# Patient Record
Sex: Female | Born: 1935
Health system: Southern US, Community
[De-identification: ages and names within clinical notes are randomized; demographics above are authoritative.]

## PROBLEM LIST (undated history)

## (undated) DIAGNOSIS — E785 Hyperlipidemia, unspecified: Secondary | ICD-10-CM

## (undated) DIAGNOSIS — T8859XA Other complications of anesthesia, initial encounter: Secondary | ICD-10-CM

## (undated) DIAGNOSIS — Z9889 Other specified postprocedural states: Secondary | ICD-10-CM

## (undated) DIAGNOSIS — M797 Fibromyalgia: Secondary | ICD-10-CM

## (undated) DIAGNOSIS — M1990 Unspecified osteoarthritis, unspecified site: Secondary | ICD-10-CM

## (undated) DIAGNOSIS — R112 Nausea with vomiting, unspecified: Secondary | ICD-10-CM

## (undated) DIAGNOSIS — T4145XA Adverse effect of unspecified anesthetic, initial encounter: Secondary | ICD-10-CM

## (undated) DIAGNOSIS — E119 Type 2 diabetes mellitus without complications: Secondary | ICD-10-CM

## (undated) HISTORY — DX: Type 2 diabetes mellitus without complications: E11.9

## (undated) HISTORY — PX: TONSILLECTOMY: SUR1361

---

## 1989-01-12 HISTORY — PX: CERVICAL DISCECTOMY: SHX98

## 1998-11-15 ENCOUNTER — Encounter: Admission: RE | Admit: 1998-11-15 | Discharge: 1998-11-15 | Payer: Self-pay | Admitting: Orthopedic Surgery

## 1998-11-15 ENCOUNTER — Encounter: Payer: Self-pay | Admitting: Orthopedic Surgery

## 1998-12-03 ENCOUNTER — Ambulatory Visit (HOSPITAL_BASED_OUTPATIENT_CLINIC_OR_DEPARTMENT_OTHER): Admission: RE | Admit: 1998-12-03 | Discharge: 1998-12-03 | Payer: Self-pay | Admitting: Orthopedic Surgery

## 1998-12-24 ENCOUNTER — Other Ambulatory Visit: Admission: RE | Admit: 1998-12-24 | Discharge: 1998-12-24 | Payer: Self-pay | Admitting: Family Medicine

## 1999-01-08 ENCOUNTER — Encounter: Admission: RE | Admit: 1999-01-08 | Discharge: 1999-01-08 | Payer: Self-pay | Admitting: Family Medicine

## 1999-01-08 ENCOUNTER — Encounter: Payer: Self-pay | Admitting: Family Medicine

## 1999-01-13 HISTORY — PX: SHOULDER ARTHROSCOPY: SHX128

## 1999-12-24 ENCOUNTER — Other Ambulatory Visit: Admission: RE | Admit: 1999-12-24 | Discharge: 1999-12-24 | Payer: Self-pay | Admitting: Family Medicine

## 2000-07-28 ENCOUNTER — Encounter: Payer: Self-pay | Admitting: Emergency Medicine

## 2000-07-28 ENCOUNTER — Emergency Department (HOSPITAL_COMMUNITY): Admission: EM | Admit: 2000-07-28 | Discharge: 2000-07-28 | Payer: Self-pay | Admitting: Emergency Medicine

## 2001-01-07 ENCOUNTER — Other Ambulatory Visit: Admission: RE | Admit: 2001-01-07 | Discharge: 2001-01-07 | Payer: Self-pay | Admitting: Family Medicine

## 2001-12-13 ENCOUNTER — Other Ambulatory Visit: Admission: RE | Admit: 2001-12-13 | Discharge: 2001-12-13 | Payer: Self-pay | Admitting: Family Medicine

## 2002-01-12 HISTORY — PX: APPENDECTOMY: SHX54

## 2002-05-04 ENCOUNTER — Inpatient Hospital Stay (HOSPITAL_COMMUNITY): Admission: EM | Admit: 2002-05-04 | Discharge: 2002-05-11 | Payer: Self-pay | Admitting: Family Medicine

## 2002-05-05 ENCOUNTER — Encounter: Payer: Self-pay | Admitting: Family Medicine

## 2002-05-05 ENCOUNTER — Encounter (INDEPENDENT_AMBULATORY_CARE_PROVIDER_SITE_OTHER): Payer: Self-pay | Admitting: *Deleted

## 2002-05-25 ENCOUNTER — Encounter: Payer: Self-pay | Admitting: Surgery

## 2002-05-25 ENCOUNTER — Encounter: Payer: Self-pay | Admitting: General Surgery

## 2002-05-25 ENCOUNTER — Inpatient Hospital Stay (HOSPITAL_COMMUNITY): Admission: AD | Admit: 2002-05-25 | Discharge: 2002-05-30 | Payer: Self-pay | Admitting: General Surgery

## 2002-05-25 ENCOUNTER — Ambulatory Visit (HOSPITAL_COMMUNITY): Admission: RE | Admit: 2002-05-25 | Discharge: 2002-05-25 | Payer: Self-pay | Admitting: Surgery

## 2002-05-30 ENCOUNTER — Encounter: Payer: Self-pay | Admitting: General Surgery

## 2002-07-05 ENCOUNTER — Encounter: Admission: RE | Admit: 2002-07-05 | Discharge: 2002-07-28 | Payer: Self-pay | Admitting: Family Medicine

## 2002-12-22 ENCOUNTER — Other Ambulatory Visit: Admission: RE | Admit: 2002-12-22 | Discharge: 2002-12-22 | Payer: Self-pay | Admitting: Family Medicine

## 2003-01-13 HISTORY — PX: DILATION AND CURETTAGE OF UTERUS: SHX78

## 2003-03-21 ENCOUNTER — Encounter (INDEPENDENT_AMBULATORY_CARE_PROVIDER_SITE_OTHER): Payer: Self-pay | Admitting: Specialist

## 2003-03-21 ENCOUNTER — Ambulatory Visit (HOSPITAL_COMMUNITY): Admission: RE | Admit: 2003-03-21 | Discharge: 2003-03-21 | Payer: Self-pay | Admitting: Obstetrics and Gynecology

## 2004-01-13 HISTORY — PX: FRACTURE SURGERY: SHX138

## 2004-02-08 ENCOUNTER — Other Ambulatory Visit: Admission: RE | Admit: 2004-02-08 | Discharge: 2004-02-08 | Payer: Self-pay | Admitting: Family Medicine

## 2004-06-18 ENCOUNTER — Ambulatory Visit (HOSPITAL_COMMUNITY): Admission: RE | Admit: 2004-06-18 | Discharge: 2004-06-18 | Payer: Self-pay | Admitting: Orthopedic Surgery

## 2004-06-18 ENCOUNTER — Ambulatory Visit (HOSPITAL_BASED_OUTPATIENT_CLINIC_OR_DEPARTMENT_OTHER): Admission: RE | Admit: 2004-06-18 | Discharge: 2004-06-19 | Payer: Self-pay | Admitting: Orthopedic Surgery

## 2004-10-16 ENCOUNTER — Ambulatory Visit (HOSPITAL_COMMUNITY): Admission: RE | Admit: 2004-10-16 | Discharge: 2004-10-16 | Payer: Self-pay | Admitting: Endocrinology

## 2005-01-12 HISTORY — PX: SHOULDER ARTHROSCOPY: SHX128

## 2005-02-09 ENCOUNTER — Other Ambulatory Visit: Admission: RE | Admit: 2005-02-09 | Discharge: 2005-02-09 | Payer: Self-pay | Admitting: Family Medicine

## 2006-01-12 HISTORY — PX: JOINT REPLACEMENT: SHX530

## 2006-03-02 ENCOUNTER — Other Ambulatory Visit: Admission: RE | Admit: 2006-03-02 | Discharge: 2006-03-02 | Payer: Self-pay | Admitting: Family Medicine

## 2006-08-02 ENCOUNTER — Inpatient Hospital Stay (HOSPITAL_COMMUNITY): Admission: RE | Admit: 2006-08-02 | Discharge: 2006-08-05 | Payer: Self-pay | Admitting: Orthopedic Surgery

## 2007-03-08 ENCOUNTER — Other Ambulatory Visit: Admission: RE | Admit: 2007-03-08 | Discharge: 2007-03-08 | Payer: Self-pay | Admitting: Family Medicine

## 2007-08-26 ENCOUNTER — Ambulatory Visit (HOSPITAL_COMMUNITY): Admission: RE | Admit: 2007-08-26 | Discharge: 2007-08-26 | Payer: Self-pay | Admitting: Orthopedic Surgery

## 2008-10-18 ENCOUNTER — Encounter: Admission: RE | Admit: 2008-10-18 | Discharge: 2008-10-18 | Payer: Self-pay | Admitting: Family Medicine

## 2010-05-27 NOTE — Op Note (Signed)
NAME:  Meghan Alvarez, HUYSER              ACCOUNT NO.:  [id number]   MEDICAL RECORD NO.:  [id number]          PATIENT TYPE:  INP   LOCATION:  5039                         FACILITY:  [facility]   PHYSICIAN:  Robert A. Thurston Hole, M.D. DATE OF BIRTH:  24-Jun-1934   DATE OF PROCEDURE:  08/02/2006  DATE OF DISCHARGE:                               OPERATIVE REPORT   PREOPERATIVE DIAGNOSIS:  Left knee degenerative joint disease.   POSTOPERATIVE DIAGNOSIS:  Left knee degenerative joint disease.   PROCEDURE:  Left total knee replacement using DePuy cemented Total Knee  System with #2.5 cemented femur, #3 cemented tibia with 15 mm  polyethylene RP tibial spacer and 32 mm polyethylene cemented patella.   SURGEON:  Elana Alm. Thurston Hole, M.D.   ASSISTANT:  Julien Girt, P.A.   ANESTHESIA:  General.   OPERATIVE TIME:  One hour and 30 minutes.   COMPLICATIONS:  None.   DESCRIPTION OF PROCEDURE:  Ms. [name] was brought to the operating  room on August 02, 2006, after a femoral nerve block was placed in the  holding room by anesthesia.  She was placed on the operating table in  the supine position.  She received Ancef 1 g IV preoperatively for  prophylaxis.  After being placed under general anesthesia, she had a  Foley catheter placed under sterile conditions.  Her left knee was  examined.  Range of motion from 0-125 degrees with mild varus deformity  and a stable ligamentous exam with normal patellar tracking.  The left  leg was then prepped using sterile DuraPrep and draped using sterile  technique.  The leg was exsanguinated and a thigh tourniquet elevated to  350 mmHg.  Initially, through a 15 cm longitudinal incision based over  the patella, initial exposure was made.  The underlying subcutaneous  tissues were incised along with the skin incision.  A median arthrotomy  was performed, revealing an excessive amount of normal appearing joint  fluid.  Articular surfaces were inspected.  She had grade 4  changes  medially, grade 3 changes laterally and grade 3 and 4 changes in the  patellofemoral joint.  Osteophytes were removed from the femoral  condyles and tibial plateau.  The medial and lateral meniscal remnants  were removed, as well as the anterior cruciate ligament.  An  intramedullary drill was drilled up the femoral canal for placement of  the distal femoral cutting jig, which was placed in the appropriate  amount of rotation, and a distal 11 mm cut was made.  The distal femur  was then sized; 2.5 was found to be the appropriate size.  The #2.5  cutting jig was placed in an appropriate amount of external rotation,  and then these cuts were made.  The proximal tibia was then exposed.  The tibial spines were removed with an oscillating saw.  Intramedullary  drill was drilled down the tibial canal for placement of the proximal  tibial cutting jig, which was placed in the appropriate amount of  rotation, and a proximal 6 mm cut was made based off the medial or lower  side.  Spacer blocks were  then placed in flexion and extension; 15 mm  blocks gave excellent balancing, excellent stability and excellent  correction of her flexion and varus deformities.  A #3 tibial baseplate  trial was then placed on the cut tibial surface; this was an excellent  fit.  And then a keel cut was made.  The 2.5 PCL box cutter was then  placed on the distal femur and then these cuts were made.  At this  point, the 2.5 femoral trial was placed and with a #3 tibial baseplate  trial and a 15 mm polyethylene RP tibial spacer, the knee was reduced,  taken through range of motion from 0-125 degrees, with excellent  stability and excellent correction of her flexion and varus deformities  and normal patellar tracking.  A resurfacing 8 mm patella cut was then  made and three locking holes placed for a 32 mm polyethylene patellar  trial, which was placed; and again, patellofemoral tracking was  evaluated and found  to be normal.  At this point, it was felt that all  the trial components were of excellent size, fit and stability.  They  were then removed.  The knee was then jet lavage irrigated with 3 liters  of saline.  The proximal tibia was then exposed.  The #3 tibial  baseplate with cement backing was hammered into position with an  excellent fit, with excess cement being removed from around the edges.  The 2.5 femoral component with cement backing was hammered into  position, also with an excellent fit, with excess cement being removed  from around the edges.  The 15 mm polyethylene RP tibial spacer was  placed on the tibial baseplate and the knee reduced, taken through range  of motion from 0-125 degrees with excellent stability and excellent  correction of her flexion and varus deformities.  The 32 mm polyethylene  cement backed patella was then placed in its position and held there  with a clamp.  After the cement hardened, then, again, patellofemoral  tracking was evaluated and found to be normal.  At this point, it was  felt that all the components were of excellent size, fit and stability.  The knee was again irrigated with saline.  Tourniquet was released.  Hemostasis was obtained with cautery.  The arthrotomy was then closed  with #1 Ethibond suture over two medium Hemovac drains.  Subcutaneous  tissues were closed with 0 and 2-0 Vicryl, subcuticular layer closed  with 4-0 Monocryl.  Sterile dressings and long-leg splint applied.  The  patient was then awakened, extubated and taken to the recovery room in  stable condition.  Needle and sponge counts were correct x2 at the end  of the case.  Neurovascular status is normal, as well.      Robert A. Thurston Hole, M.D.  Electronically Signed     RAW/MEDQ  D:  08/02/2006  T:  08/02/2006  Job:  [id number]

## 2010-05-30 NOTE — H&P (Signed)
NAME:  Meghan Alvarez, Meghan Alvarez                        ACCOUNT NO.:  [id number]   MEDICAL RECORD NO.:  [id number]                   PATIENT TYPE:  INP   LOCATION:  5703                                 FACILITY:  [facility]   PHYSICIAN:  Gabrielle Dare. Janee Morn, M.D.             DATE OF BIRTH:  [birth year]   DATE OF ADMISSION:  05/25/2002  DATE OF DISCHARGE:                                HISTORY & PHYSICAL   CHIEF COMPLAINT:  Fevers.   HISTORY OF PRESENT ILLNESS:  The patient is a 76 year old female who  underwent laparoscopic appendectomy for perforated appendicitis on May 05, 2002.  I saw her in follow-up in my office yesterday.  She complained of  some fevers at home nightly usually into the 100.5 degree range and one time  going up as high as 101.3 degrees with some associated malaise.  She was not  having any abdominal pain or problems with her GI tract, except for some  decreased appetite.  I was concerned she may have an intra-abdominal  abscess, so I arranged a CT scan of the abdomen and pelvis early this  morning as an outpatient that revealed a pelvic abscess, so she was admitted  for IV antibiotics and percutaneous drainage of this abscess.   PAST MEDICAL HISTORY:  1. Dyslipidemia.  2. Mitral valve prolapse.  3. Diverticulosis.   PAST SURGICAL HISTORY:  1. Laparoscopic appendectomy on May 05, 2002.  2. Cervical fusion by Reinaldo Meeker, M.D.  3. Shoulder surgery.  4. Heel surgery.   SOCIAL HISTORY:  She does not use alcohol or tobacco.   FAMILY HISTORY:  Her mother passed away of CVA.  She had a daughter who  passed away of a brain tumor.  She has a brother with diabetes and  myasthenia gravis.   MEDICATIONS:  Medications at home include Bextra and Augmentin which had  been started yesterday empirically.   ALLERGIES:  CODEINE.   REVIEW OF SYSTEMS:  The patient is feeling somewhat better than yesterday,  but still has some generalized malaise.  RESPIRATORY:  No  complaints.  CARDIOVASCULAR:  No complaints.  GASTROINTESTINAL:  Please see the history  of present illness.  MUSCULOSKELETAL:  Mild arthritis, but no current  complaints.  The remainder of the review of systems is negative.   PHYSICAL EXAMINATION:  VITAL SIGNS:  Her temperature is 98.9 degrees, blood  pressure 135/65, heart rate 82, and respirations 22.  GENERAL APPEARANCE:  She is awake and alert and in no acute distress.  NECK:  Supple with no adenopathy.  LUNGS:  Clear to auscultation bilaterally.  CARDIOVASCULAR:  The heart is regular rate and rhythm.  The PMI is palpable  on the left chest.  Dorsalis pedis and radial pulses are 2+ bilaterally.  ABDOMEN:  Soft, nontender, and nondistended.  Three of her incisions are  well healed without signs of infection.  She has no masses.  EXTREMITIES:  Strength is equal bilaterally.   LABORATORY DATA:  CT scan of the abdomen and pelvis today reveals a 7 x 4 cm  abscess in her cul-de-sac anterior to her rectum.  Laboratory studies  include a white blood cell count of 12.8, hemoglobin 11.1, hematocrit 33.3,  and platelets 453.  Sodium 136, potassium 4.7, chloride 100, CO2 29, BUN 7,  creatinine 0.8, glucose 106.  PT 13.9, INR 1.0, PTT 47.   IMPRESSION:  Pelvic abscess, status post laparoscopic appendectomy for  perforated appendicitis.   PLAN:  1. Admit the patient to the hospital.  2. Give IV fluids and IV antibiotics.  Use Zosyn empirically.  3. I have arranged for a percutaneous versus transrectal drainage of this     abscess by interventional radiology.  This was discussed at length with     Art A. Hoss, M.D., who plans to proceed this afternoon.  4. We will continue to monitor the output from the drain, as well as her     fever curve.   The plan was discussed at length with the patient and her husband.  Questions were answered.                                               Gabrielle Dare Janee Morn, M.D.    BET/MEDQ  D:  05/25/2002  T:   05/26/2002  Job:  [id number]

## 2010-05-30 NOTE — Op Note (Signed)
NAME:  MAKINZEY, BANES                          ACCOUNT NO.:  [id number]   MEDICAL RECORD NO.:  [id number]                   PATIENT TYPE:  INP   LOCATION:  5504                                 FACILITY:  [facility]   PHYSICIAN:  Gabrielle Dare. Janee Morn, M.D.             DATE OF BIRTH:  1934-08-13   DATE OF PROCEDURE:  05/05/2002  DATE OF DISCHARGE:                                 OPERATIVE REPORT   PREOPERATIVE DIAGNOSIS:  Acute appendicitis.   POSTOPERATIVE DIAGNOSIS:  Ruptured appendicitis.   PROCEDURE:  Laparoscopic appendectomy.   HISTORY OF PRESENT ILLNESS:  The patient is a 76 year old female who  presented to the [facility] earlier this evening with a two-day  history of abdominal pain which localized to the right lower quadrant. She  was admitted to the [facility] hospitalist service and evaluated with a CT scan  of the abdomen and pelvis which demonstrated acute appendicitis with some  free fluid in the pelvis. I saw her in consultation and brought her to the  operating room for an emergent appendectomy.   DESCRIPTION OF PROCEDURE:  The patient received intravenous antibiotics. She  was taken to the operating room and general anesthesia was administered. Her  abdomen was prepped and draped in a sterile fashion. A curvilinear  infraumbilical incision was made, subcutaneous tissues were dissected down  revealing the external fascia which was sharply divided. The peritoneal  cavity was then entered carefully under direct vision without difficulty.  Subsequently a #0 Vicryl pursestring suture was placed around the fascial  opening and a Hasson trocar was inserted into the abdomen. The abdomen was  insufflated with carbon dioxide in a standard fashion. The camera was then  inserted and the abdomen was explored. There was some clotty purulent fluid  down in the right lower quadrant. Subsequently, a 5 mm right upper quadrant  trocar was placed under direct vision followed by a 12 mm  suprapubic trocar.  Subsequently, the cecum was visualized by moving some loops of bowel that  were only mildly adherent down to the inflammatory process. There was a good  amount of free purulent material and the necrotic appearing appendix was  exposed. The appendix was freed from some attachments along the peritoneum  down into the pelvis and subsequently retracted up superiorly towards the  anterior abdominal wall. Dissection of the mesoappendix and the base of the  cecum cleared the way down to a nice viable base at the base of the cecum. A  window was made in the mesoappendix to facilitate placement of the  endoscopic GIA.  Once this was accomplished, the endoscopic GIA stapler with  the PC load was fired across the base of the appendix. A small amount of  residual tissue remained undivided and the EndoGIA was fired again across  this finishing a nice transection of the base of the appendix and the  staples appeared in good position. Subsequently  a vascular load was placed  on the endoscopic GIA and the mesoappendix was divided. At this time, the  base of the appendix and mesoappendix was inspected. There was good  integrity of the staple lines and no bleeding. The appendix was placed in an  EndoCatch bag and removed via the suprapubic port site without difficulty.  Subsequently, the abdomen was copiously irrigated with 3 liters of saline  evacuating out all purulent material from down in the pelvis and the fluid  was evacuated from the rest of the abdomen. Once again the stapled base of  the appendix and mesoappendix were reinspected. There was good hemostasis  and integrity of the staple lines and the trocars were removed under direct  vision. The pneumoperitoneum was released. The infraumbilical fascia was  closed by tying the pursestring suture and subsequently all three wounds  were copiously irrigated and then closed with 4-0 Vicryl subcuticular  suture. A  0.25% Marcaine was  used for local anesthetic at all incision sites. Sponge,  needle and instrument counts were correct. Benzoin and Steri-Strips and  sterile dressings were applied. The patient tolerated the procedure well  without apparent complications and was taken to the recovery room in stable  condition.                                               Gabrielle Dare Janee Morn, M.D.    BET/MEDQ  D:  05/05/2002  T:  05/06/2002  Job:  [id number]

## 2010-05-30 NOTE — Consult Note (Signed)
NAME:  Meghan Alvarez, Meghan Alvarez                          ACCOUNT NO.:  [id number]   MEDICAL RECORD NO.:  [id number]                   PATIENT TYPE:  INP   LOCATION:  5504                                 FACILITY:  [facility]   PHYSICIAN:  Gabrielle Dare. Janee Morn, M.D.             DATE OF BIRTH:  10-26-1934   DATE OF CONSULTATION:  05/05/2002  DATE OF DISCHARGE:                                   CONSULTATION   REASON FOR CONSULTATION:  Right lower quadrant abdominal pain.   HISTORY OF PRESENT ILLNESS:  The patient is a 76 year old female whom I was  asked to evaluate by Dr. Leander Rams from [facility].  She has history of  approximately 48 hours of abdominal pain across her lower abdomen and  subsequently persisted and localized to right lower quadrant.  She developed  nausea and vomiting over the last 12 hours.  She took some Gas-X medication  at home but did not have significant relief.  She has not had a bowel  movement since the pain began. The pain continued to worsen, and she had  some subjective fevers at home but did not take her temperature.  She  continued to have discomfort,and she was evaluated at the [facility] walk-in  clinic earlier today.  After evaluation there, she was admitted to the floor  where she underwent CT scan of the abdomen and pelvis which demonstrated  acute appendicitis.  The patient has complain of the same right lower  quadrant pain at this time.   PAST MEDICAL HISTORY:  1. Dyslipidemia.  2. Mitral valve prolapse.  3. Diverticulosis.  4. Obesity including recent 33-pound weight loss which was intentional.   PAST SURGICAL HISTORY:  1. Cervical fusion by Dr. Gerlene Fee.  2. Shoulder surgery.  3. Heel surgery.  4. Laparoscopic _______.   SOCIAL HISTORY:  She does not drink alcohol or use tobacco.   FAMILY HISTORY:  Her mother passed away of a stroke.  She had a daughter who  died of a brain tumor.  She had a brother with myoasthenia gravis and  diabetes.   MEDICATIONS  AT HOME:  Bextra.   ALLERGIES:  CODEINE.   REVIEW OF SYSTEMS:  GENERAL:  She is feeling somewhat weak. CARDIOVASCULAR:  No complaints.  RESPIRATORY:  No complaints.  GI: See History of Present  Illness.  MUSCULOSKELETAL:  She has mild arthritis, although this is not  bothering her all that much.  HEENT:  She complains of some ear discomfort  and problems for which she is being treated.  The remainder of the Review of  Systems is negative.   PHYSICAL EXAMINATION:  VITAL SIGNS:  Temperature 98.2, blood pressure  147/77, pulse 85, respirations 16, O2 saturation 97% on room air.  GENERAL:  She is awake and alert and in mild distress.  HEENT:  Pupils equal and reactive.  NECK:  Supple with no adenopathy or masses.  LUNGS:  Clear to auscultation bilaterally.  CARDIOVASCULAR:  Heart is regular rate and rhythm, and distal pulses are 2+.  ABDOMEN:  Soft and nondistended.  There are no masses palpable, but she is  tender in the right lower quadrant with voluntary guarding.  She also has a  positive Rovighi sign.  SKIN:  Warm and dry with no rash.   LABORATORY DATA:  White blood cell count 18.6, hemoglobin 14.5, platelets  233.  Sodium 134, potassium 4, chloride 102, CO2 24, BUN 11, creatinine 0.9,  glucose 147.  AST 22, ALT 27, alkaline phosphatase 86, bilirubin 1.4.  Lipase and amylase normal.   CT scan of abdomen and pelvis reviewed with radiologist and demonstrates  acute appendicitis with a small amount of free fluid down in the pelvis.   IMPRESSION:  A 76 year old female with acute appendicitis, possibly  ruptured.   PLAN:  Take patient to the operating room for appendectomy.  The procedure,  risks, and benefits were discussed with the patient, and she is eager to  proceed.                                               Gabrielle Dare Janee Morn, M.D.    BET/MEDQ  D:  05/05/2002  T:  05/06/2002  Job:  [id number]

## 2010-05-30 NOTE — Discharge Summary (Signed)
   NAME:  Meghan Alvarez, Meghan Alvarez                        ACCOUNT NO.:  [id number]   MEDICAL RECORD NO.:  [id number]                   PATIENT TYPE:  INP   LOCATION:  5504                                 FACILITY:  [facility]   PHYSICIAN:  Gabrielle Dare. Janee Morn, M.D.             DATE OF BIRTH:  1934/01/29   DATE OF ADMISSION:  05/04/2002  DATE OF DISCHARGE:  05/11/2002                                 DISCHARGE SUMMARY   DISCHARGE DIAGNOSIS:  Status post laparoscopic appendectomy for perforated  appendicitis.   HISTORY OF PRESENT ILLNESS:  The patient is a 76 year old female who was  admitted to the [facility] by Dr. Leander Rams with a 48-hour history  of abdominal pain.  CT scan, at that time, demonstrated acute appendicitis  with possible perforation.  She was taken emergently to the operating room.   HOSPITAL COURSE:  The patient underwent an uncomplicated laparoscopic  appendectomy.  She did indeed have perforated appendicitis.  Postoperatively, she tolerated gradual advancement of her diet only  requiring some laxatives and enemas to move her bowels.  She did not develop  a prolonged ileus.  She was maintained on intravenous antibiotics until her  white count and fever curve normalized which did take six days, but now she  has remained hemodynamically stable.  She has been afebrile for over 24  hours and her white blood cell count yesterday was 8.5 and she was  discharged home today in stable condition.   DISCHARGE DIET:  Regular.   DISCHARGE ACTIVITY:  No lifting for five more weeks.   DISCHARGE MEDICATIONS:  1. Percocet 5/325 one p.o. q.6h. p.r.n. pain.  2. She is also to resume her home medications which include Bextra for her     arthritis.   FOLLOW UP:  The patient is going to follow up with myself in my office in  three weeks and she is also going to follow up with her primary care doctor  in a month or so.                                               Gabrielle Dare Janee Morn,  M.D.    BET/MEDQ  D:  05/11/2002  T:  05/11/2002  Job:  [id number]

## 2010-05-30 NOTE — Op Note (Signed)
NAME:  Meghan Alvarez, Meghan Alvarez                        ACCOUNT NO.:  [id number]   MEDICAL RECORD NO.:  [id number]                   PATIENT TYPE:  AMB   LOCATION:  SDC                                  FACILITY:  [facility]   PHYSICIAN:  Artist Pais, M.D.                 DATE OF BIRTH:  1935-01-10   DATE OF PROCEDURE:  03/21/2003  DATE OF DISCHARGE:                                 OPERATIVE REPORT   PREOPERATIVE DIAGNOSES:  1. Postmenopausal bleeding.  2. Polyp on sonohysterogram.   POSTOPERATIVE DIAGNOSES:  1. Postmenopausal bleeding.  2. Polyp on sonohysterogram.  3. Patient found to have a small submucosal myoma.   PROCEDURES:  Dilatation and curettage, hysteroscopy, and polypectomy.   ANESTHESIA:  Monitored anesthesia care plus 20 mL 1% lidocaine paracervical  block.   The patient also received 2 g of IV ampicillin for SBE prophylaxis.   DRAINS:  None.  Foley:  None.   FLUIDS REPLACED:  700 mL of crystalloid.   ESTIMATED BLOOD LOSS:  Minimal.   FINDINGS:  Right endometrial polyp on a stalk adjacent to the right tubal  ostium.  There was a small fibroid adjacent to the left tubal ostium, which  was submucosal in location.   SURGEON:  Artist Pais, M.D.   COMPLICATIONS:  None.   DESCRIPTION OF OPERATION:  The patient was brought to the operating room,  identified on the operating room table.  After the patient was adequately  sedated, she was placed in a dorsal lithotomy position and prepped and  draped in the usual sterile fashion.  The bladder was straight-catheterized  for a small amount of clear yellow urine.  A bimanual examination revealed  the uterus to be midposition, normal and mobile.  A speculum was placed and  the anterior lip of the cervix was infiltrated with 1 mL of 1% lidocaine.  The remaining 19 mL of lidocaine were placed for a paracervical block.  It  should be noted that in the preop area the patient did receive 2 g of IV  ampicillin for SBE prophylaxis.   The uterus sounded to 7 cm.  The cervix was  very carefully and gently dilated up to a #25 Pratt dilator.  Dilatation  proceeded very gently and carefully to decrease the risk of uterine  perforation.  Subsequently the [facility]I hysteroscope using sorbitol as a  distending medium was placed.  A careful and thorough hysteroscopic  examination was performed.  The endometrium did appear thin, but I did note  the polyp seen on sonohysterogram emanating from adjacent to the right tubal  ostium.  It was free-floating on a stalk.  There was also a small submucosal  fibroid superior to the left tubal ostium.  At that point the hysteroscope  was withdrawn and a curettage was performed.  The Randall stone forceps were  placed, and I was able to grasp the polyp and remove it in  its entirety.  I  did curette over the fibroid, but it was noted to be a small fibroid and not  a polyp.  After a good cry was heard all around, the Randall stone forceps  were again placed and additional tissue was obtained.  Subsequently the [facility]  hysteroscope was placed and the polyp was noted to have been removed in its  entirety and, in fact, the endometrium was noted to be entirely well-  sampled.  At that point the procedure was then terminated.  The hysteroscope  was withdrawn, the tenaculum was withdrawn, and there was noted to be no  bleeding from the tenaculum site.  The patient was subsequently transferred  to the recovery room in stable condition after all instrument, sponge, and  needle counts were correct.  In addition, the patient did receive postop  instructions.  She is urged to refrain from intercourse for two to three  weeks until I see her back for a postoperative evaluation.  She is to call  for heavy bleeding such as soaking a large pad an hour for three straight  hours or any problems.  She may take ibuprofen 400-600 mg every six hours as  needed for pain.  Of note, she did receive Toradol 30 mg IV at the end  of  the procedure.                                               Artist Pais, M.D.    DC/MEDQ  D:  03/21/2003  T:  03/22/2003  Job:  [id number]   cc:   Chales Salmon. Abigail Miyamoto, M.D.  [address]  [place]  [place] [address]  Fax: [phone]

## 2010-05-30 NOTE — Discharge Summary (Signed)
   NAME:  NJERI, Meghan Alvarez                        ACCOUNT NO.:  [id number]   MEDICAL RECORD NO.:  [id number]                   PATIENT TYPE:  INP   LOCATION:  5703                                 FACILITY:  [facility]   PHYSICIAN:  Gabrielle Dare. Janee Morn, M.D.             DATE OF BIRTH:  1934-02-27   DATE OF ADMISSION:  05/25/2002  DATE OF DISCHARGE:  05/30/2002                                 DISCHARGE SUMMARY   DISCHARGE DIAGNOSES:  1. Status post drainage of pelvic abscess,  2. Status post laparoscopic appendectomy for perforated appendicitis.   HISTORY OF PRESENT ILLNESS:  The patient is a 76 year old white female who  underwent a laparoscopic appendectomy for perforated appendicitis on  05/05/02.  She developed fevers at home.  I evaluated her in my office on  05/24/02 and ordered a CT scan of the abdomen and pelvis to evaluate for  abscess.  She underwent that on the morning of 05/25/02, and this showed a  pelvic abscess, and she was admitted to the hospital.   HOSPITAL COURSE:  The patient was admitted, placed on intravenous  antibiotics (Zosyn), and given some IV antibiotics.  She underwent drainage  of her pelvic abscess transrectally by interventional radiology.  Fluid was  returned, which grew E. coli.  She was continued on her Zosyn.  The drainage  gradually tapered, and a follow up CT revealed resolution of the abscess,  and the drain was removed.  She was switched to oral antibiotics, which she  tolerated.  She remained afebrile and was discharged home in stable  condition.   DISCHARGE DIET:  Regular.   DISCHARGE ACTIVITY:  As tolerated.   DISCHARGE MEDICATIONS:  1. Augmentin 875 mg p.o. b.i.d.  2. Tylox 5/325 one p.o. q.6h. p.r.Meghan. pain.  3. Follow up with myself in two weeks.                                               Gabrielle Dare Janee Morn, M.D.    BET/MEDQ  D:  06/19/2002  T:  06/19/2002  Job:  [id number]

## 2010-05-30 NOTE — Discharge Summary (Signed)
NAME:  Meghan Alvarez, Meghan Alvarez              ACCOUNT NO.:  [id number]   MEDICAL RECORD NO.:  [id number]          PATIENT TYPE:  INP   LOCATION:  5039                         FACILITY:  [facility]   PHYSICIAN:  Robert A. Thurston Hole, M.D. DATE OF BIRTH:  [birth year]   DATE OF ADMISSION:  08/02/2006  DATE OF DISCHARGE:  08/05/2006                               DISCHARGE SUMMARY   ADMITTING DIAGNOSIS:  End-stage degenerative joint disease, left knee.   DISCHARGE DIAGNOSES:  1. End-stage degenerative joint disease, left knee.  2. Obesity.   HISTORY OF PRESENT ILLNESS:  The patient is a 76 year old white female  with a history of end-stage DJD of both knees.  She failed conservative  care including interarticular cortisone injections and interarticular  Supartz injections.  Risks, benefits and possible complications of a  left total knee replacement have been discussed with her.   ALLERGIES:  CODEINE.   PROCEDURES IN HOUSE:  On August 02, 2006, the patient underwent a left  total knee replacement by Dr. Thurston Hole and a femoral nerve block by  anesthesia.  She tolerated the procedure well.  Postoperatively, she  received an Autovac transfusion to prevent postoperative blood loss  anemia.  She tolerated this well.  She tolerated her CPM 0 to 90 degrees  8 hours a day.  Postoperative day #1, temperature was 98.6, hemoglobin  was 1.1.  INR was 1.1.  Surgical wound was well approximated.  Her Foley  was discontinued.  Her PCA was discontinued.  She was given a 30-minute  passive stretch.  She was placed on Dilaudid p.o. for pain.  Physical  therapy got her up.  Postoperative day #1, she ambulated approximately  50 feet, was able to do her quad pumps.  Postoperative day #2, T-max 99,  hemoglobin 10.8.  She was metabolically stable.  Actually temperature on  exam was 99; T-max was 101.  INR was 1.4.  She tolerated her CPM 0 to 90  degrees.  Surgical wound was well-approximated.  She was given a  Dulcolax  suppository.  She progressed slowly with physical therapy on  postoperative day #2, only being able to walk 16 feet.  Due to her fever  and her slow progression with physical therapy, she was kept one more  day.  Postoperative day #3, T-max 100.6, hemoglobin 11.5.  INR was 2.1.  Surgical wound was well-approximated.  She tolerated CPM 0 to 90  degrees.  She was discharged to home on postoperative day #3,  weightbearing as tolerated on a regular diet.   DISCHARGE MEDICATIONS:  1. Dilaudid 2 mg 1 to 2 q.4-6h. p.r.n. pain.  2. Robaxin 500 mg 1 q.4-6h. p.r.n. muscle spasm.  3. Coumadin 3 mg 1 tablet daily.  4. Keflex 500 mg 1 p.o. q.i.d. for 10 days.  5. Colace 100 mg twice a day.  6. Senokot-S 2 tablets at night.   She has been instructed to change her dressing daily.  Call us with  increased redness, increased swelling, increased temperature, increased  pain.  She has been instructed to elevate her left heel on a folded  pillow 30 minutes  a day and use her CPM 0 to 90 degrees 8 hours a day.  She will follow up with Dr. Thurston Hole on August 4.  She has been cleared to  shower on August 10, 2006.      Kirstin Shepperson, P.A.      Robert A. Thurston Hole, M.D.  Electronically Signed    KS/MEDQ  D:  10/06/2006  T:  10/07/2006  Job:  [id number]

## 2010-05-30 NOTE — Op Note (Signed)
NAMEBRYONA, FOXWORTHY              ACCOUNT NO.:  [id number]   MEDICAL RECORD NO.:  [id number]          PATIENT TYPE:  AMB   LOCATION:  DSC                          FACILITY:  [facility]   PHYSICIAN:  Cindee Salt, M.D.       DATE OF BIRTH:  Jun 10, 1934   DATE OF PROCEDURE:  06/18/2004  DATE OF DISCHARGE:                                 OPERATIVE REPORT   PREOPERATIVE DIAGNOSIS:  Fractured olecranon and radial head, left elbow.   POSTOPERATIVE DIAGNOSIS:  Fractured olecranon and radial head, left elbow.   OPERATION:  1.  Excision of radial head fragment.  2.  Open reduction, internal fixation, comminuted olecranon fracture.   SURGEON:  Cindee Salt, M.D.   ASSISTANT:  Artist Pais. Mina Marble, M.D.   ANESTHESIA:  General.   HISTORY:  The patient is a 76 year old female who fell in [facility],  suffering a fracture of her olecranon, comminuted in nature, with a  questionable fracture of the radial head.  She is admitted for open  reduction and internal fixation.   PROCEDURE:  The patient was brought to the operating room, where a general  anesthetic was carried out without difficulty.  She was prepped using  DuraPrep, supine position, left arm free.  The arm was brought across her  chest and the limb exsanguinated with an Esmarch bandage.  Tourniquet placed  on the upper arm was inflated to 250 mmHg.  A straight incision was made  over the elbow, carried down through subcutaneous tissue.  Bleeders were  electrocauterized.  Dissection carried down to the fracture.  A wide  separation of the olecranon fracture with fragment was immediately apparent.  This was debrided and significant crepitation was present within the joint.  The radial head was then visualized through the joint and a fracture  fragment on the radial head articular surface which was entirely loose was  removed.  This left approximately 80% of the radial head intact.  A separate  fracture was present on the capitellum.  This  was of posterior.  This was  removed.  The joint was otherwise stable.  The fracture fragment of the  olecranon was then reduced after irrigation of the joint.  The bone was held  in position.  An Accumed olecranon fixation plate was then placed.  This was  held in position and a distal screw holding it in position was then  inserted.  This measured 20 mm.  The OVC image intensification was used to  localize the plate.  Two screws were then placed into the fracture of the  coronoid.  These measured 27 and 25 mm.  These were angled so as to capture  the fragment; however, with pronation and supination, some crepitation was  felt to the radioulnar joint.  It was felt that the screws may the slightly  malaligned.  The oblique x-rays confirmed that threads were present at the  ulnar-radial head articulation.  These screws were removed one by one and  replaced with 20 and 25 mm screws.  This firmly fixed the capitellum in position.  A longitudinal screw was  then  placed just to the ulnar side of the other two screw holes into the fracture  of the coronoid, firmly fixing it into position.  This was lagged, as were the other two in the capitellum to firmly stabilize  the fracture.  The articular surface was well-aligned, the fracture  fragments reduced.  The remaining screws were placed.  These measured 16 and  14 mm, firmly fixing the fracture in position.  Full flexion-extension,  pronation, supination was then afforded.  There was no instability and no  crepitation with rotation.  The wound was copiously irrigated.  The  subcutaneous tissue was then closed with 4-0 Vicryl suture and the skin  stapled.  A sterile compressive dressing and a long-arm splint applied.  The  patient tolerated the procedure well and was taken to the recovery room for  observation in satisfactory condition.  On deflation of the tourniquet,  fingers immediately pinked.      GK/MEDQ  D:  06/18/2004  T:  06/18/2004   Job:  [id number]

## 2010-05-30 NOTE — H&P (Signed)
NAME:  Meghan Alvarez, THUMM                        ACCOUNT NO.:  [id number]   MEDICAL RECORD NO.:  [id number]                   PATIENT TYPE:  AMB   LOCATION:  SDC                                  FACILITY:  [facility]   PHYSICIAN:  Artist Pais, M.D.                 DATE OF BIRTH:  04/19/1934   DATE OF ADMISSION:  DATE OF DISCHARGE:                                HISTORY & PHYSICAL   DATE OF ADMISSION:  March 21, 2003   HISTORY OF PRESENT ILLNESS:  The patient is a 76 year old Caucasian female  gravida 5 para 4 referred from Dr. Abigail Miyamoto at [place] [facility].  She was found to have a thickened endometrium on ultrasound.  This occurred  after she had a CT of her abdomen and pelvis to explore some right flank  pain.  Her plain films of her back and abdominal CT were normal; however,  the endometrial stripe was noted to be slightly thickened and Dr. Abigail Miyamoto  obtained a pelvic ultrasound showing an endometrium at 10.5 mm with  increased echogenicity which is abnormal for a postmenopausal patient.  She  was referred for further workup.  When I asked her had she had any  postmenopausal bleeding she said that she had had postmenopausal bleeding  for years.  She subsequently underwent a sonohysterogram which revealed  there to be a 7 x 8 mm polyp in the endometrial cavity.  The patient did  tolerate the procedure well.  She was advised to undergo a D&C hysteroscopy  and polypectomy.  Risks of surgery including anesthetic complication,  hemorrhage, infection, damage to adjacent structures including bladder,  bowel, blood vessels, or ureter discussed with the patient.  She was made  aware of the risk of uterine perforation which could result in overwhelming  life-threatening hemorrhage requiring emergent hysterectomy, and uterine  perforation which could result in bowel damage requiring emergent colostomy  or which could result in overwhelming, life-threatening peritonitis.  She  expressed  understanding of and acceptance of these risks and desires to  proceed with surgery.  She knows that due to her history of postmenopausal  bleeding and endometrial polyp we need to rule out endometrial hyperplasia  or endometrial cancer.   OBSTETRICAL AND GYNECOLOGICAL HISTORY:  Menarche age 63.  The patient is  menopausal.  She has no history of an STD or PID.  She does not have a  history of an abnormal Pap.   PAST MEDICAL HISTORY:  1. Arthritis.  2. Mitral valve prolapse.  3. Osteoarthritis.  4. Obesity.  5. Hyperlipidemia.  6. Diverticulosis.  7. History of rectal bleeding.   ALLERGIES:  CODEINE causes nausea and vomiting.   CURRENT MEDICATIONS:  1. Diclofenac 75 mg b.i.d.  2. Augmentin previously.  3. She previously was on Prempro but discontinued this several years ago.   SURGERIES:  History of a ruptured appendix with a subsequent abscess in  2004, bone fusion in 1991, knee arthroscopy, shoulder arthroscopy, and  removal of a heel spur in 2003.  She also has a history of a nasal fracture  as well.   FAMILY HISTORY:  The patient's mother died at 58 of pneumonia and a CVA.  Her father died at age 53 of a brain tumor.  She has one brother age 7 with  myasthenia gravis and diabetes and a sister age 32 alive and well.   SOCIAL HISTORY:  The patient is retired.  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  Tobacco:  None.  Alcohol:  None.   REVIEW OF SYSTEMS:  Noncontributory except as noted above.  Denies headache,  visual changes, chest pain, shortness of breath, abdominal pain, change in  bowel habits, unintentional weight loss, dysuria, urgency, frequency,  vaginal pruritus or discharge, pain or bleeding with intercourse.   PHYSICAL EXAMINATION:  GENERAL APPEARANCE:  A well-developed Caucasian  female.  VITAL SIGNS:  Blood pressure 130/84, weight 192, heart rate 72.  HEENT:  Normal.  NECK:  Supple without thyromegaly, adenopathy, or nodules.  CHEST:   Clear to auscultation.  BREASTS:  Symmetric without masses, nodes, nipple retraction, or nipple  discharge.  CARDIAC:  Regular rate and rhythm without extra sounds or murmurs.  ABDOMEN:  Soft, nontender, obese.  No hepatosplenomegaly or masses.  EXTREMITIES:  No cyanosis, clubbing, or edema.  NEUROLOGIC:  Oriented x3, grossly normal.  PELVIC:  Normal external female genitalia.  No vulvar, vaginal, or cervical  lesions.  Pap smear was performed by Dr. Abigail Miyamoto recently and noted to be  within normal limits.  Bimanual examination reveals the uterus to be normal,  mobile, nontender, without any adnexal mass palpated.  RECTAL:  Excellent sphincter tone, confirms pelvic exam.  No masses  palpated.   Of note, the patient's Pap smear did not show any evidence of any  endometrial cells.   ASSESSMENT AND PLAN:  The patient is a 76 year old Caucasian female with  postmenopausal bleeding, a thickened endometrium, and endometrial polyp  admitted for [facility] hysteroscopy and polypectomy.  I did receive a letter from  Dr. Abigail Miyamoto clearing her for surgery and we will order endocarditis  prophylaxis for her.  Interestingly, when I asked her if she took  antibiotics prior to dental procedures she said no, that she did not know  she was supposed to, but Dr. Recardo Evangelist notes clearly indicate that she does  need SBE prophylaxis.  All of her questions were answered.  The patient  desires to proceed with surgery.                                               Artist Pais, M.D.    DC/MEDQ  D:  03/21/2003  T:  03/21/2003  Job:  [id number]

## 2010-10-27 LAB — PROTIME-INR
INR: 1
INR: 1.1
INR: 1.4
INR: 2.1 — ABNORMAL HIGH
Prothrombin Time: 13.3
Prothrombin Time: 14.1
Prothrombin Time: 24.8 — ABNORMAL HIGH

## 2010-10-27 LAB — COMPREHENSIVE METABOLIC PANEL
ALT: 65 — ABNORMAL HIGH
BUN: 8
CO2: 27
Calcium: 10.2
GFR calc non Af Amer: >60
Glucose, Bld: 120 — ABNORMAL HIGH
Sodium: 138
Total Protein: 7.3

## 2010-10-27 LAB — ABO/RH: ABO/RH(D): A POS

## 2010-10-27 LAB — CBC
HCT: 40
Hemoglobin: 10.8 — ABNORMAL LOW
Hemoglobin: 13.9
MCHC: 34.7
MCHC: 34.7
MCV: 93.7
MCV: 94
Platelets: 195
Platelets: 202
RBC: 4.26
RDW: 13.5
RDW: 13.9
WBC: 9
WBC: 9.8

## 2010-10-27 LAB — URINALYSIS, ROUTINE W REFLEX MICROSCOPIC
Bilirubin Urine: NEGATIVE
Hgb urine dipstick: NEGATIVE
Ketones, ur: 15 — AB
Leukocytes, UA: NEGATIVE
Nitrite: NEGATIVE
Nitrite: NEGATIVE
Protein, ur: NEGATIVE
Protein, ur: NEGATIVE
Urobilinogen, UA: 0.2
pH: 7

## 2010-10-27 LAB — BASIC METABOLIC PANEL
BUN: 8
BUN: 9
CO2: 28
Calcium: 8.7
Calcium: 9.1
Chloride: 100
Creatinine, Ser: 0.87
Creatinine, Ser: 0.99
GFR calc Af Amer: >60
GFR calc Af Amer: >60
GFR calc non Af Amer: 55 — ABNORMAL LOW
GFR calc non Af Amer: 60 — ABNORMAL LOW
Glucose, Bld: 147 — ABNORMAL HIGH
Sodium: 137

## 2010-10-27 LAB — DIFFERENTIAL
Basophils Relative: 1
Basophils Relative: 1
Eosinophils Absolute: 0.1
Eosinophils Absolute: 0.1
Lymphs Abs: 2.6
Monocytes Relative: 7
Neutro Abs: 2.7
Neutrophils Relative %: 46
Neutrophils Relative %: 61

## 2010-10-27 LAB — URINE MICROSCOPIC-ADD ON

## 2010-10-27 LAB — TYPE AND SCREEN: ABO/RH(D): A POS

## 2010-10-27 LAB — APTT: aPTT: 30

## 2010-10-27 LAB — URINE CULTURE

## 2010-12-30 ENCOUNTER — Ambulatory Visit: Payer: Medicare Other | Attending: Rheumatology | Admitting: Physical Therapy

## 2010-12-30 DIAGNOSIS — R5381 Other malaise: Secondary | ICD-10-CM | POA: Insufficient documentation

## 2010-12-30 DIAGNOSIS — IMO0001 Reserved for inherently not codable concepts without codable children: Secondary | ICD-10-CM | POA: Insufficient documentation

## 2010-12-30 DIAGNOSIS — M6281 Muscle weakness (generalized): Secondary | ICD-10-CM | POA: Insufficient documentation

## 2010-12-30 DIAGNOSIS — M2550 Pain in unspecified joint: Secondary | ICD-10-CM | POA: Insufficient documentation

## 2011-01-16 ENCOUNTER — Ambulatory Visit: Payer: Medicare Other | Attending: Rheumatology | Admitting: Physical Therapy

## 2011-01-16 DIAGNOSIS — IMO0001 Reserved for inherently not codable concepts without codable children: Secondary | ICD-10-CM | POA: Insufficient documentation

## 2011-01-16 DIAGNOSIS — M2550 Pain in unspecified joint: Secondary | ICD-10-CM | POA: Insufficient documentation

## 2011-01-16 DIAGNOSIS — R5381 Other malaise: Secondary | ICD-10-CM | POA: Insufficient documentation

## 2011-01-16 DIAGNOSIS — M6281 Muscle weakness (generalized): Secondary | ICD-10-CM | POA: Insufficient documentation

## 2011-01-20 ENCOUNTER — Ambulatory Visit: Payer: Medicare Other | Admitting: Physical Therapy

## 2011-01-26 ENCOUNTER — Ambulatory Visit: Payer: Medicare Other | Admitting: Physical Therapy

## 2011-01-29 ENCOUNTER — Encounter: Payer: Medicare Other | Admitting: Physical Therapy

## 2011-02-02 ENCOUNTER — Ambulatory Visit: Payer: Medicare Other | Admitting: Physical Therapy

## 2011-02-09 ENCOUNTER — Encounter: Payer: Medicare Other | Admitting: Physical Therapy

## 2011-05-04 IMAGING — CT CT HEAD WO/W CM
3 series · 18 of 30 positions shown, 20 images · IV contrast (agent unspecified)
Comparison: None

CLINICAL DATA: Persistent headaches.

CT HEAD WITHOUT AND WITH CONTRAST
TECHNIQUE: Contiguous axial images were obtained from the base of
the skull through the vertex without and with intravenous contrast
Contrast: 75 ml Kmnipaque-[facility] IV.

[Series 2: head wo · axial · 0.49mm/px · z∈[-365,-265]mm · 8 of 26 slices shown, 10 images]
[im 3/26  brain]
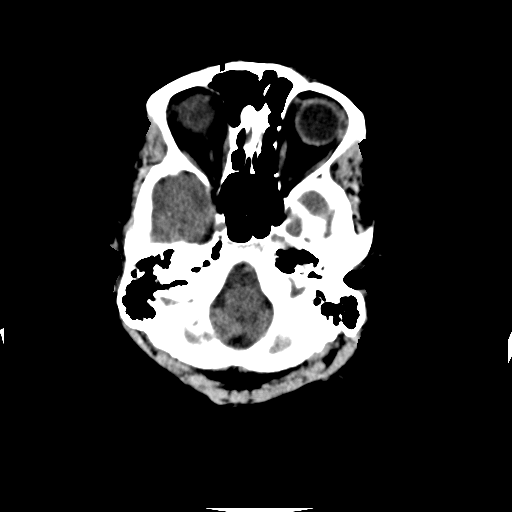
[im 3/26  bone]
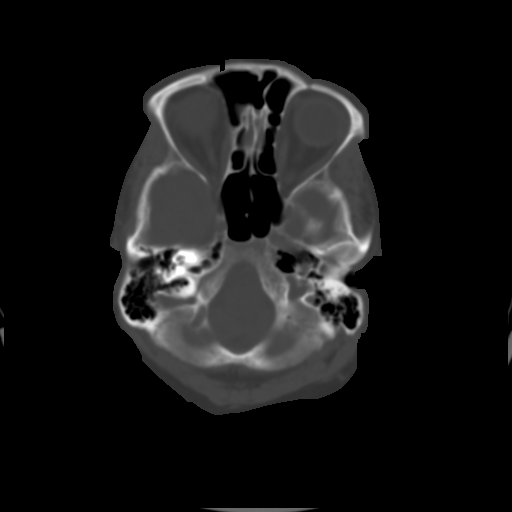
[im 6/26  brain]
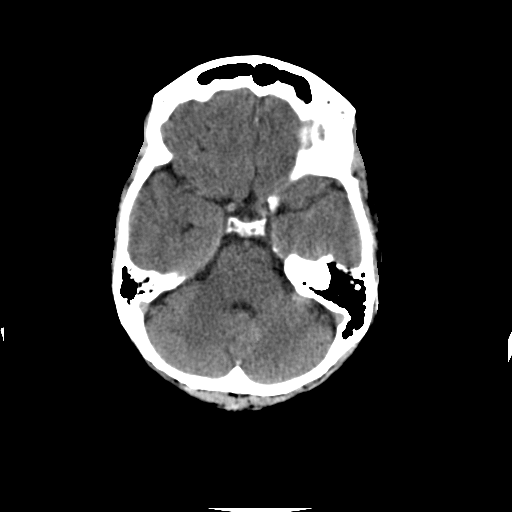
[im 9/26  brain]
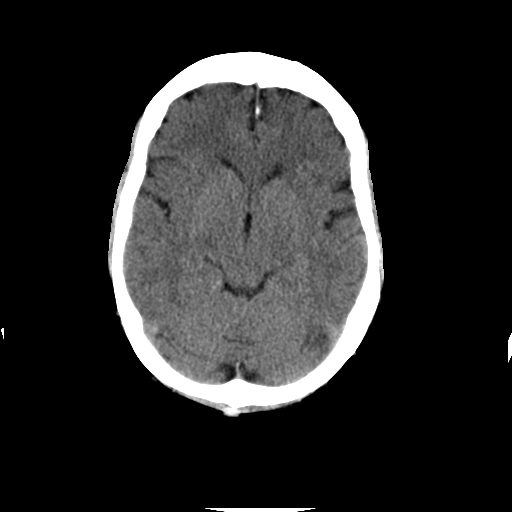
[im 12/26  brain]
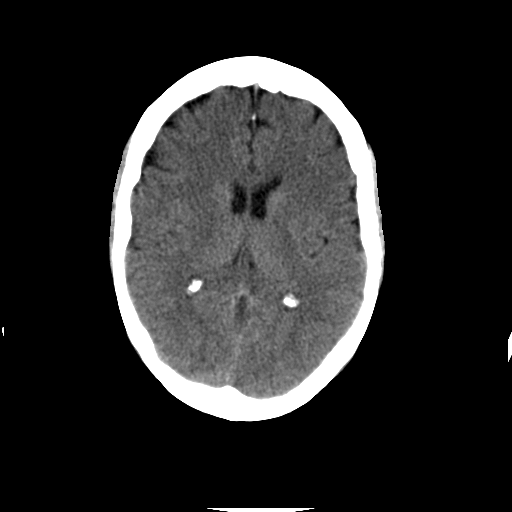
[im 14/26  brain]
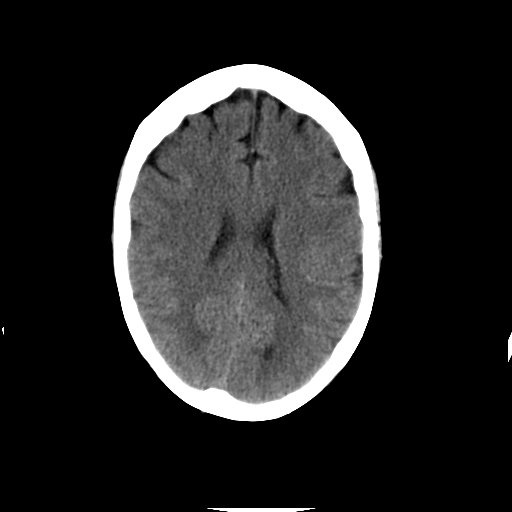
[im 14/26  bone]
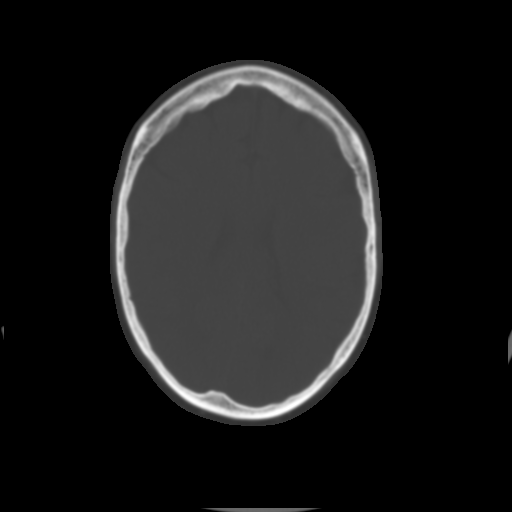
[im 17/26  brain]
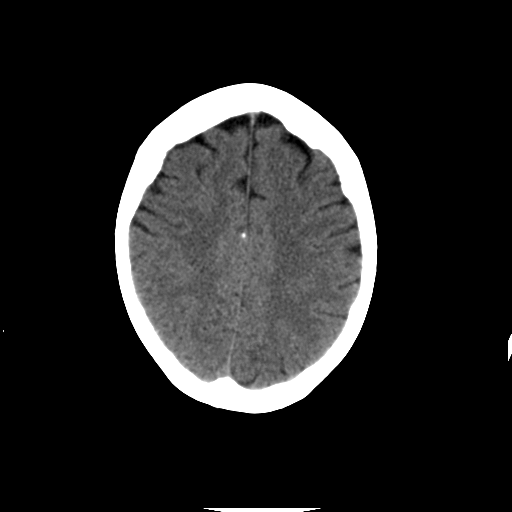
[im 20/26  brain]
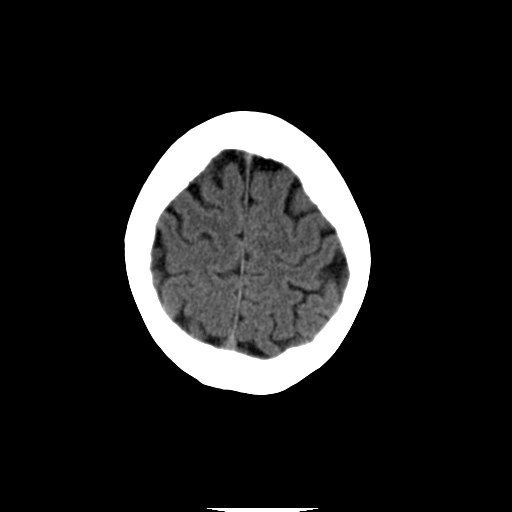
[im 23/26  brain]
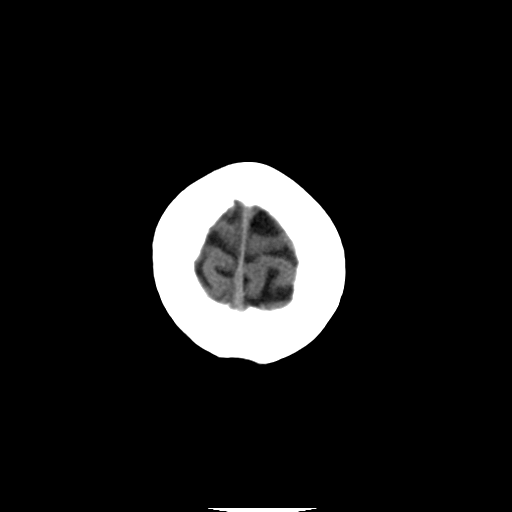

[Series 3: head w/ · axial · 0.49mm/px · z∈[-365,-265]mm · 8 of 26 slices shown]
[im 3/26  brain]
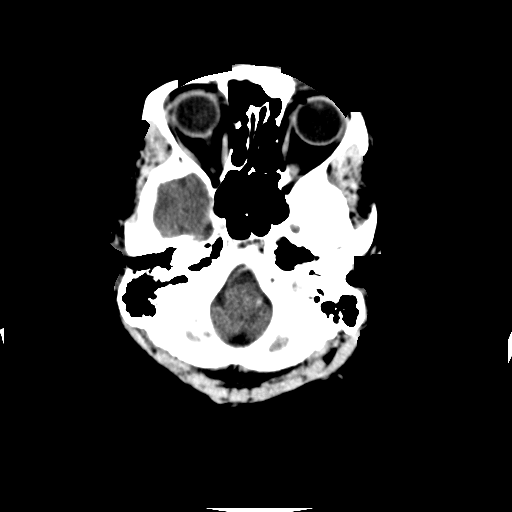
[im 6/26  brain]
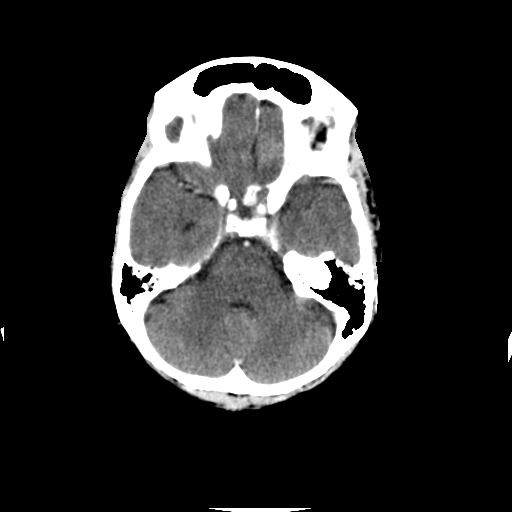
[im 9/26  brain]
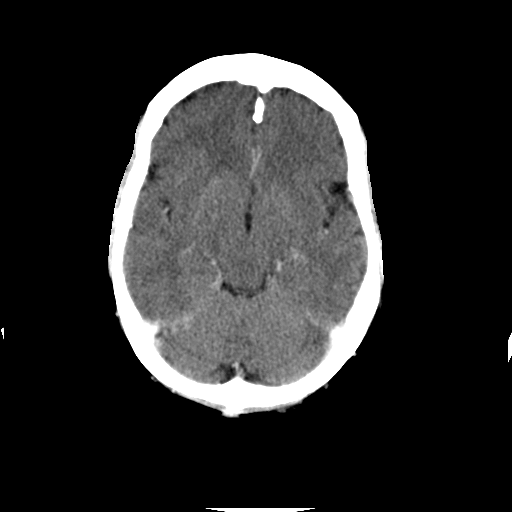
[im 12/26  brain]
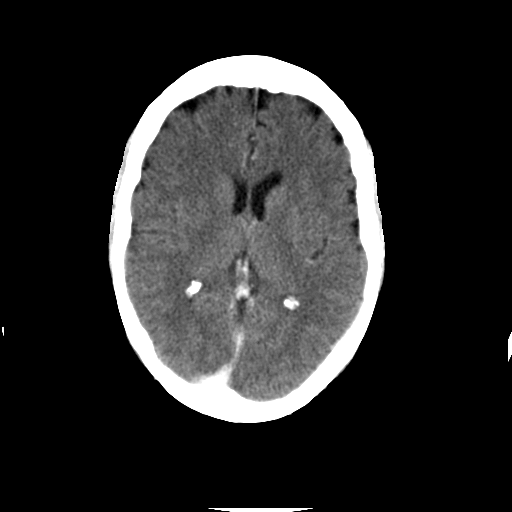
[im 14/26  brain]
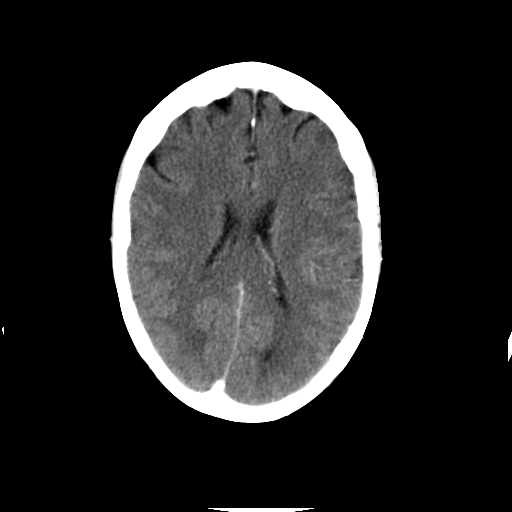
[im 17/26  brain]
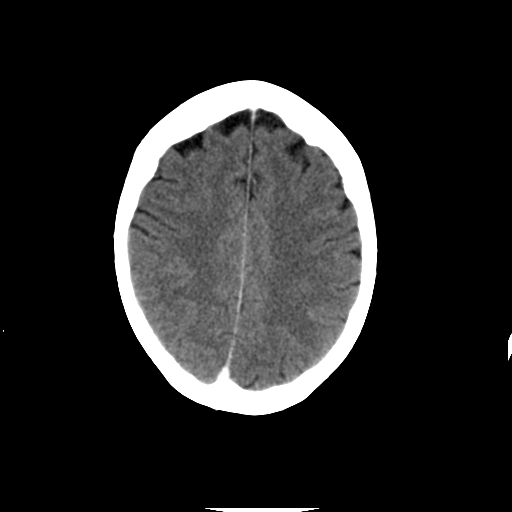
[im 20/26  brain]
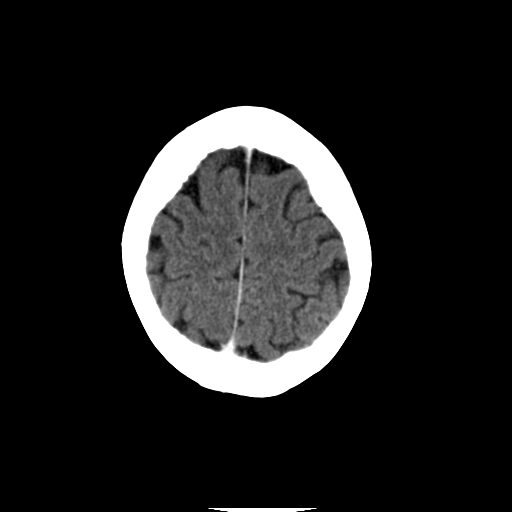
[im 23/26  brain]
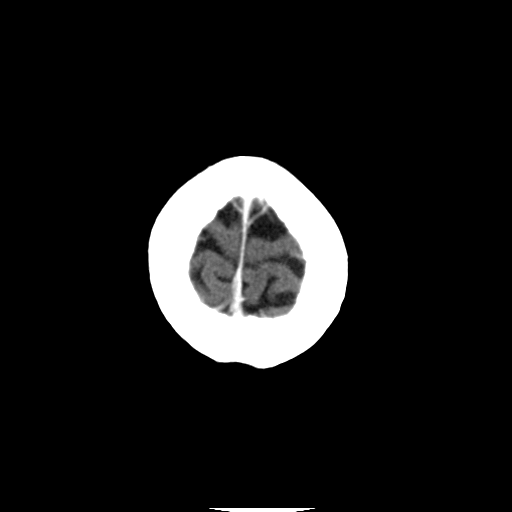

[Series 4: head bone · axial · 0.49mm/px · z∈[-365,-350]mm · 2 of 26 slices shown]
[im 3/26  bone]
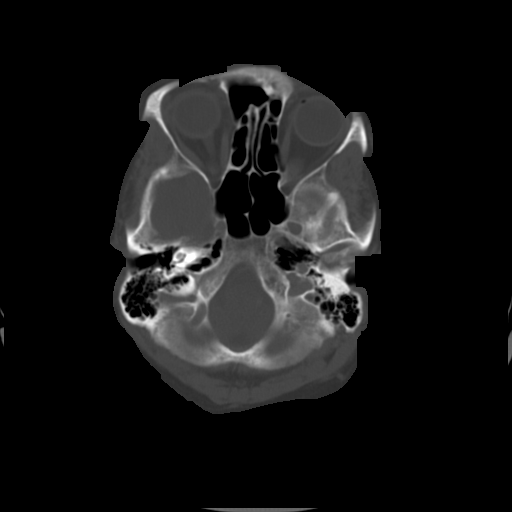
[im 6/26  bone]
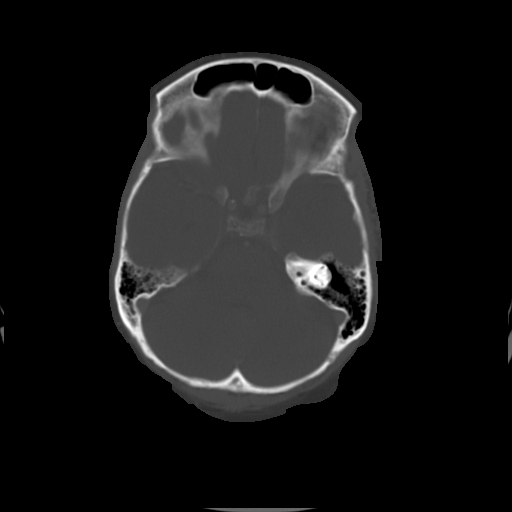

[18 of 30 positions shown; findings below may reference images not displayed]

FINDINGS: The brain has a normal appearance without evidence for
hemorrhage, acute infarction, hydrocephalus or mass lesion.  There
is no extra axial fluid collection.  The skull and paranasal
sinuses are normal.

Following contrast infusion there is a normal enhancement pattern
and no mass lesion is identified.
IMPRESSION: Normal CT of the head without and with contrast.

## 2012-12-28 ENCOUNTER — Other Ambulatory Visit (HOSPITAL_COMMUNITY): Payer: Self-pay | Admitting: Orthopaedic Surgery

## 2012-12-28 DIAGNOSIS — M25561 Pain in right knee: Secondary | ICD-10-CM

## 2013-01-03 ENCOUNTER — Other Ambulatory Visit: Payer: Self-pay | Admitting: Gastroenterology

## 2013-01-04 ENCOUNTER — Encounter (HOSPITAL_COMMUNITY): Payer: Self-pay

## 2013-01-04 ENCOUNTER — Encounter (HOSPITAL_COMMUNITY): Payer: Medicare Other

## 2013-01-10 ENCOUNTER — Encounter (HOSPITAL_COMMUNITY)
Admission: RE | Admit: 2013-01-10 | Discharge: 2013-01-10 | Disposition: A | Discharge status: Home / Self Care | Payer: Medicare Other | Source: Ambulatory Visit | Attending: Orthopaedic Surgery | Admitting: Orthopaedic Surgery

## 2013-01-10 ENCOUNTER — Ambulatory Visit (HOSPITAL_COMMUNITY): Payer: Medicare Other

## 2013-01-10 ENCOUNTER — Ambulatory Visit (HOSPITAL_COMMUNITY)
Admission: RE | Admit: 2013-01-10 | Discharge: 2013-01-10 | Disposition: A | Discharge status: Home / Self Care | Payer: Medicare Other | Source: Ambulatory Visit | Attending: Orthopaedic Surgery | Admitting: Orthopaedic Surgery

## 2013-01-10 ENCOUNTER — Encounter (HOSPITAL_COMMUNITY): Payer: Medicare Other

## 2013-01-10 DIAGNOSIS — Z96659 Presence of unspecified artificial knee joint: Secondary | ICD-10-CM | POA: Insufficient documentation

## 2013-01-10 DIAGNOSIS — M25569 Pain in unspecified knee: Secondary | ICD-10-CM | POA: Insufficient documentation

## 2013-01-10 DIAGNOSIS — M25561 Pain in right knee: Secondary | ICD-10-CM

## 2013-01-10 MED ORDER — TECHNETIUM TC 99M MEDRONATE IV KIT
25.0000 | PACK | Freq: Once | INTRAVENOUS | Status: AC | PRN
  Administered 2013-01-10: 25 via INTRAVENOUS

## 2014-03-19 ENCOUNTER — Encounter (HOSPITAL_COMMUNITY)
Admission: RE | Admit: 2014-03-19 | Discharge: 2014-03-19 | Disposition: A | Discharge status: Home / Self Care | Payer: Medicare Other | Source: Ambulatory Visit | Attending: Orthopaedic Surgery | Admitting: Orthopaedic Surgery

## 2014-03-19 ENCOUNTER — Encounter (HOSPITAL_COMMUNITY): Payer: Self-pay

## 2014-03-19 HISTORY — DX: Other complications of anesthesia, initial encounter: T88.59XA

## 2014-03-19 HISTORY — DX: Adverse effect of unspecified anesthetic, initial encounter: T41.45XA

## 2014-03-19 HISTORY — DX: Unspecified osteoarthritis, unspecified site: M19.90

## 2014-03-19 HISTORY — DX: Other specified postprocedural states: Z98.890

## 2014-03-19 HISTORY — DX: Nausea with vomiting, unspecified: R11.2

## 2014-03-19 HISTORY — DX: Fibromyalgia: M79.7

## 2014-03-19 LAB — PROTIME-INR
INR: 1.01 (ref 0.00–1.49)
PROTHROMBIN TIME: 13.4 s (ref 11.6–15.2)

## 2014-03-19 LAB — BASIC METABOLIC PANEL
Anion gap: 8 (ref 5–15)
BUN: 9 mg/dL (ref 6–23)
CHLORIDE: 107 mmol/L (ref 96–112)
CO2: 25 mmol/L (ref 19–32)
CREATININE: 0.97 mg/dL (ref 0.50–1.10)
Calcium: 10.4 mg/dL (ref 8.4–10.5)
GFR calc Af Amer: 64 mL/min — ABNORMAL LOW (ref >90)
GFR calc non Af Amer: 55 mL/min — ABNORMAL LOW (ref >90)
GLUCOSE: 98 mg/dL (ref 70–99)
Potassium: 4.4 mmol/L (ref 3.5–5.1)
Sodium: 140 mmol/L (ref 135–145)

## 2014-03-19 LAB — SURGICAL PCR SCREEN
MRSA, PCR: NEGATIVE
STAPHYLOCOCCUS AUREUS: NEGATIVE

## 2014-03-19 LAB — CBC
HEMATOCRIT: 43.1 % (ref 36.0–46.0)
HEMOGLOBIN: 14.6 g/dL (ref 12.0–15.0)
MCH: 31.9 pg (ref 26.0–34.0)
MCHC: 33.9 g/dL (ref 30.0–36.0)
MCV: 94.1 fL (ref 78.0–100.0)
Platelets: 265 10*3/uL (ref 150–400)
RBC: 4.58 MIL/uL (ref 3.87–5.11)
RDW: 12.9 % (ref 11.5–15.5)
WBC: 7.2 10*3/uL (ref 4.0–10.5)

## 2014-03-19 LAB — TYPE AND SCREEN
ABO/RH(D): A POS
Antibody Screen: NEGATIVE

## 2014-03-19 LAB — APTT: APTT: 31 s (ref 24–37)

## 2014-03-19 NOTE — Pre-Procedure Instructions (Addendum)
Meghan Alvarez  03/19/2014   Your procedure is scheduled on:  03/30/14  Report to [place] short stay admitting at 1115 AM.  Call this number if you have problems the morning of surgery: [phone]   Remember:   Do not eat food or drink liquids after midnight.   Take these medicines the morning of surgery with A SIP OF WATER: robaxin, savella    STOP all herbel meds, nsaids (aleve,naproxen,advil,ibuprofen) 5 days prior to surgery starting 03/24/13 including vitamins, aspirin, fish oil   Do not wear jewelry, make-up or nail polish.  Do not wear lotions, powders, or perfumes. You may wear deodorant.  Do not shave 48 hours prior to surgery. Men may shave face and neck.  Do not bring valuables to the hospital.  [facility] is not responsible                  for any belongings or valuables.               Contacts, dentures or bridgework may not be worn into surgery.  Leave suitcase in the car. After surgery it may be brought to your room.  For patients admitted to the hospital, discharge time is determined by your                treatment team.               Patients discharged the day of surgery will not be allowed to drive  home.  Name and phone number of your driver:   Special Instructions:  Special Instructions: [place] - Preparing for Surgery  Before surgery, you can play an important role.  Because skin is not sterile, your skin needs to be as free of germs as possible.  You can reduce the number of germs on you skin by washing with CHG (chlorahexidine gluconate) soap before surgery.  CHG is an antiseptic cleaner which kills germs and bonds with the skin to continue killing germs even after washing.  Please DO NOT use if you have an allergy to CHG or antibacterial soaps.  If your skin becomes reddened/irritated stop using the CHG and inform your nurse when you arrive at Short Stay.  Do not shave (including legs and underarms) for at least 48 hours prior to the first CHG  shower.  You may shave your face.  Please follow these instructions carefully:   1.  Shower with CHG Soap the night before surgery and the morning of Surgery.  2.  If you choose to wash your hair, wash your hair first as usual with your normal shampoo.  3.  After you shampoo, rinse your hair and body thoroughly to remove the Shampoo.  4.  Use CHG as you would any other liquid soap.  You can apply chg directly  to the skin and wash gently with scrungie or a clean washcloth.  5.  Apply the CHG Soap to your body ONLY FROM THE NECK DOWN.  Do not use on open wounds or open sores.  Avoid contact with your eyes ears, mouth and genitals (private parts).  Wash genitals (private parts)       with your normal soap.  6.  Wash thoroughly, paying special attention to the area where your surgery will be performed.  7.  Thoroughly rinse your body with warm water from the neck down.  8.  DO NOT shower/wash with your normal soap after using and rinsing off the CHG Soap.  9.  Pat yourself dry with a clean towel.            10.  Wear clean pajamas.            11.  Place clean sheets on your bed the night of your first shower and do not sleep with pets.  Day of Surgery  Do not apply any lotions/deodorants the morning of surgery.  Please wear clean clothes to the hospital/surgery center.   Please read over the following fact sheets that you were given: Pain Booklet, Coughing and Deep Breathing, Blood Transfusion Information, Total Joint Packet, MRSA Information and Surgical Site Infection Prevention

## 2014-03-21 NOTE — H&P (Signed)
Meghan Alvarez is an 80 y.o. female.    Chief Complaint: right knee pain  HPI: Pt is a 80 y.o. female complaining of right knee pain for multiple years. Pain had continually increased since the beginning. X-rays in the clinic show end-stage arthritic changes of the right knee. Pt has tried various conservative treatments which have failed to alleviate their symptoms, including injections and therapy. Various options are discussed with the patient. Risks, benefits and expectations were discussed with the patient. Patient understand the risks, benefits and expectations and wishes to proceed with surgery.   PCP:  Gennette Pac, MD  D/C Plans:  Home with HHPT PMH: Past Medical History  Diagnosis Date  . Complication of anesthesia   . PONV (postoperative nausea and vomiting)   . Fibromyalgia   . Arthritis     PSH: Past Surgical History  Procedure Laterality Date  . Tonsillectomy    . Shoulder arthroscopy Right 2001  . Cervical discectomy  91  . Appendectomy  04    ruptured   . Dilation and curettage of uterus  05  . Fracture surgery Left 06    arm  . Shoulder arthroscopy Left 07  . Joint replacement Left 08    knee repl    Social History:  reports that she quit smoking about 40 years ago. Her smoking use included Cigarettes. She has a .5 pack-year smoking history. She does not have any smokeless tobacco history on file. She reports that she drinks alcohol. She reports that she does not use illicit drugs.  Allergies:  Allergies  Allergen Reactions  . Azithromycin Nausea And Vomiting  . Codeine Nausea And Vomiting    Medications: No current facility-administered medications for this encounter.   Current Outpatient Prescriptions  Medication Sig Dispense Refill  . alendronate (FOSAMAX) 70 MG tablet Take 70 mg by mouth once a week. On Sundays  2  . methocarbamol (ROBAXIN) 750 MG tablet Take 750 mg by mouth 2 (two) times daily as needed for muscle spasms.   0  .  Milnacipran (SAVELLA) 50 MG TABS tablet Take 50 mg by mouth 2 (two) times daily.    . Multiple Vitamin (MULTIVITAMIN WITH MINERALS) TABS tablet Take 1 tablet by mouth daily. Centrum    . Omega-3 Fatty Acids (FISH OIL) 1000 MG CAPS Take 1,000 mg by mouth at bedtime.    . vitamin B-12 (CYANOCOBALAMIN) 1000 MCG tablet Take 1,000 mcg by mouth daily.    . Vitamin D, Ergocalciferol, (DRISDOL) 50000 UNITS CAPS capsule Take 50,000 Units by mouth once a week. On Sundays  0  . traMADol (ULTRAM) 50 MG tablet Take by mouth every 6 (six) hours as needed.      Results for orders placed or performed during the hospital encounter of 03/19/14 (from the past 48 hour(s))  Surgical pcr screen     Status: None   Collection Time: 03/19/14  2:51 PM  Result Value Ref Range   MRSA, PCR NEGATIVE NEGATIVE   Staphylococcus aureus NEGATIVE NEGATIVE    Comment:        The Xpert SA Assay (FDA approved for NASAL specimens in patients over 69 years of age), is one component of a comprehensive surveillance program.  Test performance has been validated by [facility] for patients greater than or equal to 75 year old. It is not intended to diagnose infection nor to guide or monitor treatment.   Type and screen     Status: None   Collection Time: 03/19/14  2:51 PM  Result Value Ref Range   ABO/RH(D) A POS    Antibody Screen NEG    Sample Expiration 07/23/1973   Basic metabolic panel     Status: Abnormal   Collection Time: 03/19/14  2:52 PM  Result Value Ref Range   Sodium 140 135 - 145 mmol/L   Potassium 4.4 3.5 - 5.1 mmol/L   Chloride 107 96 - 112 mmol/L   CO2 25 19 - 32 mmol/L   Glucose, Bld 98 70 - 99 mg/dL   BUN 9 6 - 23 mg/dL   Creatinine, Ser 0.97 0.50 - 1.10 mg/dL   Calcium 10.4 8.4 - 10.5 mg/dL   GFR calc non Af Amer 55 (L) >90 mL/min   GFR calc Af Amer 64 (L) >90 mL/min    Comment: (NOTE) The eGFR has been calculated using the CKD EPI equation. This calculation has not been validated in all  clinical situations. eGFR's persistently <90 mL/min signify possible Chronic Kidney Disease.    Anion gap 8 5 - 15  CBC     Status: None   Collection Time: 03/19/14  2:52 PM  Result Value Ref Range   WBC 7.2 4.0 - 10.5 K/uL   RBC 4.58 3.87 - 5.11 MIL/uL   Hemoglobin 14.6 12.0 - 15.0 g/dL   HCT 43.1 36.0 - 46.0 %   MCV 94.1 78.0 - 100.0 fL   MCH 31.9 26.0 - 34.0 pg   MCHC 33.9 30.0 - 36.0 g/dL   RDW 12.9 11.5 - 15.5 %   Platelets 265 150 - 400 K/uL  Protime-INR     Status: None   Collection Time: 03/19/14  2:52 PM  Result Value Ref Range   Prothrombin Time 13.4 11.6 - 15.2 seconds   INR 1.01 0.00 - 1.49  PTT     Status: None   Collection Time: 03/19/14  2:52 PM  Result Value Ref Range   aPTT 31 24 - 37 seconds   No results found.  ROS: Pain with rom of the right lower extremity  Physical Exam:  Alert and oriented 80 y.o. female in no acute distress Cranial nerves 2-12 intact Cervical spine: full rom with no tenderness, nv intact distally Chest: active breath sounds bilaterally, no wheeze rhonchi or rales Heart: regular rate and rhythm, no murmur Abd: non tender non distended with active bowel sounds Hip is stable with rom  Right knee with moderate crepitus with rom nv intact distally No rashes or edema Antalgic gait Tenderness along medial and lateral joint lines  Assessment/Plan Assessment: right knee end stage osteoarthritis  Plan: Patient will undergo a right total knee arthroplasty by Dr. Veverly Fells at [facility]. Risks benefits and expectations were discussed with the patient. Patient understand risks, benefits and expectations and wishes to proceed.

## 2014-03-29 MED ORDER — CEFAZOLIN SODIUM-DEXTROSE 2-3 GM-% IV SOLR
2.0000 g | INTRAVENOUS | Status: AC
  Administered 2014-03-30: 2 g via INTRAVENOUS
  Filled 2014-03-29: qty 50

## 2014-03-29 MED ORDER — SCOPOLAMINE 1 MG/3DAYS TD PT72
1.0000 | MEDICATED_PATCH | TRANSDERMAL | Status: DC
  Filled 2014-03-29: qty 1

## 2014-03-29 NOTE — Progress Notes (Signed)
Pt. Notified of time change,to arrive at 1330.

## 2014-03-30 ENCOUNTER — Encounter (HOSPITAL_COMMUNITY): Payer: Self-pay | Admitting: Surgery

## 2014-03-30 ENCOUNTER — Inpatient Hospital Stay (HOSPITAL_COMMUNITY): Payer: Medicare Other | Admitting: Anesthesiology

## 2014-03-30 ENCOUNTER — Encounter (HOSPITAL_COMMUNITY)
Admission: RE | Disposition: A | Discharge status: Skilled Nursing Facility | Payer: Self-pay | Source: Ambulatory Visit | Attending: Orthopedic Surgery

## 2014-03-30 ENCOUNTER — Inpatient Hospital Stay (HOSPITAL_COMMUNITY)
Admission: RE | Admit: 2014-03-30 | Discharge: 2014-04-02 | DRG: 470 | Disposition: A | Discharge status: Skilled Nursing Facility | Payer: Medicare Other | Source: Ambulatory Visit | Attending: Orthopedic Surgery | Admitting: Orthopedic Surgery

## 2014-03-30 DIAGNOSIS — R05 Cough: Secondary | ICD-10-CM | POA: Diagnosis not present

## 2014-03-30 DIAGNOSIS — Z87891 Personal history of nicotine dependence: Secondary | ICD-10-CM | POA: Diagnosis not present

## 2014-03-30 DIAGNOSIS — Z96659 Presence of unspecified artificial knee joint: Secondary | ICD-10-CM

## 2014-03-30 DIAGNOSIS — M797 Fibromyalgia: Secondary | ICD-10-CM | POA: Diagnosis present

## 2014-03-30 DIAGNOSIS — M1711 Unilateral primary osteoarthritis, right knee: Secondary | ICD-10-CM | POA: Diagnosis present

## 2014-03-30 DIAGNOSIS — Z96652 Presence of left artificial knee joint: Secondary | ICD-10-CM | POA: Diagnosis present

## 2014-03-30 DIAGNOSIS — K5900 Constipation, unspecified: Secondary | ICD-10-CM | POA: Diagnosis present

## 2014-03-30 DIAGNOSIS — M25561 Pain in right knee: Secondary | ICD-10-CM | POA: Diagnosis present

## 2014-03-30 HISTORY — PX: TOTAL KNEE ARTHROPLASTY: SHX125

## 2014-03-30 SURGERY — ARTHROPLASTY, KNEE, TOTAL
Anesthesia: Monitor Anesthesia Care | Site: Knee | Laterality: Right

## 2014-03-30 MED ORDER — ONDANSETRON HCL 4 MG/2ML IJ SOLN
INTRAMUSCULAR | Status: AC
  Filled 2014-03-30: qty 2

## 2014-03-30 MED ORDER — POLYETHYLENE GLYCOL 3350 17 G PO PACK: 17.0000 g | PACK | Freq: Every day | ORAL | Status: DC | PRN

## 2014-03-30 MED ORDER — TRAMADOL HCL 50 MG PO TABS: 50.0000 mg | ORAL_TABLET | Freq: Four times a day (QID) | ORAL | Status: DC | PRN

## 2014-03-30 MED ORDER — METHOCARBAMOL 500 MG PO TABS: 500.0000 mg | ORAL_TABLET | Freq: Three times a day (TID) | ORAL | Status: DC | PRN

## 2014-03-30 MED ORDER — HYDROMORPHONE HCL 1 MG/ML IJ SOLN
INTRAMUSCULAR | Status: AC
  Filled 2014-03-30: qty 1

## 2014-03-30 MED ORDER — BUPIVACAINE IN DEXTROSE 0.75-8.25 % IT SOLN
INTRATHECAL | Status: DC | PRN
  Administered 2014-03-30: 15 mg via INTRATHECAL

## 2014-03-30 MED ORDER — PROPOFOL 10 MG/ML IV BOLUS
INTRAVENOUS | Status: DC | PRN
  Administered 2014-03-30: 10 mg via INTRAVENOUS
  Administered 2014-03-30: 20 mg via INTRAVENOUS
  Administered 2014-03-30: 10 mg via INTRAVENOUS

## 2014-03-30 MED ORDER — PROPOFOL 10 MG/ML IV BOLUS
INTRAVENOUS | Status: AC
  Filled 2014-03-30: qty 20

## 2014-03-30 MED ORDER — SODIUM CHLORIDE 0.9 % IR SOLN
Status: DC | PRN
  Administered 2014-03-30: 3000 mL

## 2014-03-30 MED ORDER — 0.9 % SODIUM CHLORIDE (POUR BTL) OPTIME
TOPICAL | Status: DC | PRN
  Administered 2014-03-30: 1000 mL

## 2014-03-30 MED ORDER — ADULT MULTIVITAMIN W/MINERALS CH
1.0000 | ORAL_TABLET | Freq: Every day | ORAL | Status: DC
  Administered 2014-03-31 – 2014-04-02 (×3): 1 via ORAL
  Filled 2014-03-30 (×3): qty 1

## 2014-03-30 MED ORDER — HYDROMORPHONE HCL 1 MG/ML IJ SOLN
0.2500 mg | INTRAMUSCULAR | Status: DC | PRN
  Administered 2014-03-30 (×5): 0.5 mg via INTRAVENOUS

## 2014-03-30 MED ORDER — MILNACIPRAN HCL 50 MG PO TABS
50.0000 mg | ORAL_TABLET | Freq: Two times a day (BID) | ORAL | Status: DC
  Administered 2014-03-31 – 2014-04-02 (×5): 50 mg via ORAL
  Filled 2014-03-30 (×7): qty 1

## 2014-03-30 MED ORDER — MIDAZOLAM HCL 2 MG/2ML IJ SOLN
INTRAMUSCULAR | Status: AC
  Filled 2014-03-30: qty 2

## 2014-03-30 MED ORDER — OXYCODONE HCL 5 MG PO TABS
ORAL_TABLET | ORAL | Status: AC
  Filled 2014-03-30: qty 1

## 2014-03-30 MED ORDER — PHENOL 1.4 % MT LIQD: 1.0000 | OROMUCOSAL | Status: DC | PRN

## 2014-03-30 MED ORDER — FENTANYL CITRATE 0.05 MG/ML IJ SOLN
INTRAMUSCULAR | Status: DC | PRN
  Administered 2014-03-30: 50 ug via INTRAVENOUS

## 2014-03-30 MED ORDER — LIDOCAINE HCL (CARDIAC) 20 MG/ML IV SOLN
INTRAVENOUS | Status: AC
  Filled 2014-03-30: qty 5

## 2014-03-30 MED ORDER — COUMADIN BOOK
Freq: Once | Status: DC
  Filled 2014-03-30: qty 1

## 2014-03-30 MED ORDER — METHOCARBAMOL 500 MG PO TABS
500.0000 mg | ORAL_TABLET | Freq: Four times a day (QID) | ORAL | Status: DC | PRN
  Administered 2014-03-31 – 2014-04-01 (×3): 500 mg via ORAL
  Filled 2014-03-30 (×4): qty 1

## 2014-03-30 MED ORDER — WARFARIN VIDEO: Freq: Once | Status: DC

## 2014-03-30 MED ORDER — VITAMIN D (ERGOCALCIFEROL) 1.25 MG (50000 UNIT) PO CAPS
50000.0000 [IU] | ORAL_CAPSULE | ORAL | Status: DC
  Administered 2014-04-01: 50000 [IU] via ORAL
  Filled 2014-03-30: qty 1

## 2014-03-30 MED ORDER — METOCLOPRAMIDE HCL 5 MG/ML IJ SOLN
5.0000 mg | Freq: Three times a day (TID) | INTRAMUSCULAR | Status: DC | PRN
Start: 2014-03-30 — End: 2014-04-02
  Administered 2014-03-30: 10 mg via INTRAVENOUS
  Filled 2014-03-30: qty 2

## 2014-03-30 MED ORDER — OMEGA-3-ACID ETHYL ESTERS 1 G PO CAPS
2.0000 g | ORAL_CAPSULE | Freq: Every day | ORAL | Status: DC
  Administered 2014-03-31 – 2014-04-02 (×3): 2 g via ORAL
  Filled 2014-03-30 (×3): qty 2

## 2014-03-30 MED ORDER — ONDANSETRON HCL 4 MG/2ML IJ SOLN
INTRAMUSCULAR | Status: AC
  Filled 2014-03-30: qty 4

## 2014-03-30 MED ORDER — PROPOFOL INFUSION 10 MG/ML OPTIME
INTRAVENOUS | Status: DC | PRN
  Administered 2014-03-30: 50 ug/kg/min via INTRAVENOUS

## 2014-03-30 MED ORDER — ONDANSETRON HCL 4 MG PO TABS: 4.0000 mg | ORAL_TABLET | Freq: Four times a day (QID) | ORAL | Status: DC | PRN

## 2014-03-30 MED ORDER — HYDROMORPHONE HCL 1 MG/ML IJ SOLN
0.5000 mg | INTRAMUSCULAR | Status: DC | PRN
  Administered 2014-03-30 – 2014-03-31 (×4): 0.5 mg via INTRAVENOUS
  Filled 2014-03-30 (×4): qty 1

## 2014-03-30 MED ORDER — ACETAMINOPHEN 325 MG PO TABS
650.0000 mg | ORAL_TABLET | Freq: Four times a day (QID) | ORAL | Status: DC | PRN
  Administered 2014-03-31 – 2014-04-01 (×3): 650 mg via ORAL
  Filled 2014-03-30 (×3): qty 2

## 2014-03-30 MED ORDER — LACTATED RINGERS IV SOLN
INTRAVENOUS | Status: DC | PRN
  Administered 2014-03-30 (×2): via INTRAVENOUS

## 2014-03-30 MED ORDER — EPHEDRINE SULFATE 50 MG/ML IJ SOLN
INTRAMUSCULAR | Status: AC
  Filled 2014-03-30: qty 1

## 2014-03-30 MED ORDER — SCOPOLAMINE 1 MG/3DAYS TD PT72
1.0000 | MEDICATED_PATCH | TRANSDERMAL | Status: DC
  Administered 2014-03-30: 1.5 mg via TRANSDERMAL

## 2014-03-30 MED ORDER — LIDOCAINE HCL (CARDIAC) 20 MG/ML IV SOLN
INTRAVENOUS | Status: DC | PRN
  Administered 2014-03-30: 40 mg via INTRAVENOUS

## 2014-03-30 MED ORDER — CEFAZOLIN SODIUM-DEXTROSE 2-3 GM-% IV SOLR
2.0000 g | Freq: Four times a day (QID) | INTRAVENOUS | Status: AC
  Administered 2014-03-30 – 2014-03-31 (×2): 2 g via INTRAVENOUS
  Filled 2014-03-30 (×2): qty 50

## 2014-03-30 MED ORDER — HYDROMORPHONE HCL 1 MG/ML IJ SOLN: 0.5000 mg | INTRAMUSCULAR | Status: DC | PRN

## 2014-03-30 MED ORDER — MIDAZOLAM HCL 5 MG/5ML IJ SOLN
INTRAMUSCULAR | Status: DC | PRN
  Administered 2014-03-30 (×2): 1 mg via INTRAVENOUS

## 2014-03-30 MED ORDER — PROMETHAZINE HCL 25 MG/ML IJ SOLN
6.2500 mg | INTRAMUSCULAR | Status: DC | PRN
  Administered 2014-03-30: 6.25 mg via INTRAVENOUS

## 2014-03-30 MED ORDER — METHOCARBAMOL 750 MG PO TABS: 750.0000 mg | ORAL_TABLET | Freq: Two times a day (BID) | ORAL | Status: DC | PRN

## 2014-03-30 MED ORDER — METHOCARBAMOL 1000 MG/10ML IJ SOLN
500.0000 mg | Freq: Four times a day (QID) | INTRAVENOUS | Status: DC | PRN
  Administered 2014-03-30: 500 mg via INTRAVENOUS
  Filled 2014-03-30 (×3): qty 5

## 2014-03-30 MED ORDER — FENTANYL CITRATE 0.05 MG/ML IJ SOLN
INTRAMUSCULAR | Status: AC
  Filled 2014-03-30: qty 5

## 2014-03-30 MED ORDER — MENTHOL 3 MG MT LOZG: 1.0000 | LOZENGE | OROMUCOSAL | Status: DC | PRN

## 2014-03-30 MED ORDER — SODIUM CHLORIDE 0.9 % IV SOLN
INTRAVENOUS | Status: DC
  Administered 2014-03-30 – 2014-04-01 (×3): via INTRAVENOUS

## 2014-03-30 MED ORDER — WARFARIN SODIUM 5 MG PO TABS
5.0000 mg | ORAL_TABLET | Freq: Once | ORAL | Status: AC
  Administered 2014-03-30: 5 mg via ORAL
  Filled 2014-03-30: qty 1

## 2014-03-30 MED ORDER — ACETAMINOPHEN 650 MG RE SUPP: 650.0000 mg | Freq: Four times a day (QID) | RECTAL | Status: DC | PRN

## 2014-03-30 MED ORDER — ALENDRONATE SODIUM 70 MG PO TABS: 70.0000 mg | ORAL_TABLET | ORAL | Status: DC

## 2014-03-30 MED ORDER — VITAMIN B-12 1000 MCG PO TABS
1000.0000 ug | ORAL_TABLET | Freq: Every day | ORAL | Status: DC
  Administered 2014-03-31 – 2014-04-02 (×3): 1000 ug via ORAL
  Filled 2014-03-30 (×3): qty 1

## 2014-03-30 MED ORDER — OXYCODONE-ACETAMINOPHEN 5-325 MG PO TABS: 1.0000 | ORAL_TABLET | ORAL | Status: DC | PRN

## 2014-03-30 MED ORDER — BISACODYL 10 MG RE SUPP: 10.0000 mg | Freq: Every day | RECTAL | Status: DC | PRN

## 2014-03-30 MED ORDER — ONDANSETRON HCL 4 MG/2ML IJ SOLN
4.0000 mg | Freq: Four times a day (QID) | INTRAMUSCULAR | Status: DC | PRN
  Administered 2014-03-30 – 2014-03-31 (×2): 4 mg via INTRAVENOUS
  Filled 2014-03-30: qty 2

## 2014-03-30 MED ORDER — WARFARIN - PHARMACIST DOSING INPATIENT
Freq: Every day | Status: DC
  Administered 2014-03-31: 18:00:00

## 2014-03-30 MED ORDER — LACTATED RINGERS IV SOLN
INTRAVENOUS | Status: DC
  Administered 2014-03-30: 14:00:00 via INTRAVENOUS

## 2014-03-30 MED ORDER — OXYCODONE HCL 5 MG PO TABS
5.0000 mg | ORAL_TABLET | ORAL | Status: DC | PRN
  Administered 2014-03-30 (×2): 5 mg via ORAL
  Administered 2014-03-31 (×2): 10 mg via ORAL
  Filled 2014-03-30: qty 1
  Filled 2014-03-30 (×2): qty 2

## 2014-03-30 MED ORDER — CHLORHEXIDINE GLUCONATE 4 % EX LIQD
60.0000 mL | Freq: Once | CUTANEOUS | Status: DC
  Filled 2014-03-30: qty 60

## 2014-03-30 MED ORDER — METOCLOPRAMIDE HCL 5 MG PO TABS
5.0000 mg | ORAL_TABLET | Freq: Three times a day (TID) | ORAL | Status: DC | PRN
  Filled 2014-03-30: qty 2

## 2014-03-30 SURGICAL SUPPLY — 66 items
BANDAGE ESMARK 6X9 LF (GAUZE/BANDAGES/DRESSINGS) ×1 IMPLANT
BLADE SAG 18X100X1.27 (BLADE) ×2 IMPLANT
BLADE SAW SGTL 13.0X1.19X90.0M (BLADE) ×2 IMPLANT
BNDG CMPR 9X6 STRL LF SNTH (GAUZE/BANDAGES/DRESSINGS) ×1
BNDG CMPR MED 10X6 ELC LF (GAUZE/BANDAGES/DRESSINGS) ×1
BNDG ELASTIC 6X10 VLCR STRL LF (GAUZE/BANDAGES/DRESSINGS) ×2 IMPLANT
BNDG ESMARK 6X9 LF (GAUZE/BANDAGES/DRESSINGS) ×2
BNDG GAUZE ELAST 4 BULKY (GAUZE/BANDAGES/DRESSINGS) ×5 IMPLANT
BOWL SMART MIX CTS (DISPOSABLE) ×2 IMPLANT
CAP KNEE TOTAL 3 SIGMA ×1 IMPLANT
CEMENT HV SMART SET (Cement) ×4 IMPLANT
CLSR STERI-STRIP ANTIMIC 1/2X4 (GAUZE/BANDAGES/DRESSINGS) ×2 IMPLANT
COVER SURGICAL LIGHT HANDLE (MISCELLANEOUS) ×2 IMPLANT
CUFF TOURNIQUET SINGLE 34IN LL (TOURNIQUET CUFF) IMPLANT
CUFF TOURNIQUET SINGLE 44IN (TOURNIQUET CUFF) IMPLANT
DRAPE EXTREMITY T 121X128X90 (DRAPE) ×2 IMPLANT
DRAPE IMP U-DRAPE 54X76 (DRAPES) ×2 IMPLANT
DRAPE PROXIMA HALF (DRAPES) ×2 IMPLANT
DRAPE U-SHAPE 47X51 STRL (DRAPES) ×2 IMPLANT
DRSG ADAPTIC 3X8 NADH LF (GAUZE/BANDAGES/DRESSINGS) ×2 IMPLANT
DRSG PAD ABDOMINAL 8X10 ST (GAUZE/BANDAGES/DRESSINGS) ×4 IMPLANT
DURAPREP 26ML APPLICATOR (WOUND CARE) ×2 IMPLANT
ELECT CAUTERY BLADE 6.4 (BLADE) ×2 IMPLANT
ELECT REM PT RETURN 9FT ADLT (ELECTROSURGICAL) ×2
ELECTRODE REM PT RTRN 9FT ADLT (ELECTROSURGICAL) ×1 IMPLANT
GAUZE SPONGE 4X4 12PLY STRL (GAUZE/BANDAGES/DRESSINGS) ×2 IMPLANT
GLOVE BIO SURGEON STRL SZ7 (GLOVE) ×1 IMPLANT
GLOVE BIOGEL PI IND STRL 6.5 (GLOVE) IMPLANT
GLOVE BIOGEL PI IND STRL 7.0 (GLOVE) IMPLANT
GLOVE BIOGEL PI INDICATOR 6.5 (GLOVE) ×1
GLOVE BIOGEL PI INDICATOR 7.0 (GLOVE) ×1
GLOVE BIOGEL PI ORTHO PRO 7.5 (GLOVE) ×1
GLOVE BIOGEL PI ORTHO PRO SZ8 (GLOVE) ×1
GLOVE ORTHO TXT STRL SZ7.5 (GLOVE) ×2 IMPLANT
GLOVE PI ORTHO PRO STRL 7.5 (GLOVE) ×1 IMPLANT
GLOVE PI ORTHO PRO STRL SZ8 (GLOVE) ×1 IMPLANT
GLOVE SURG ORTHO 8.5 STRL (GLOVE) ×2 IMPLANT
GLOVE SURG SS PI 6.5 STRL IVOR (GLOVE) ×1 IMPLANT
GOWN STRL REUS W/ TWL LRG LVL3 (GOWN DISPOSABLE) IMPLANT
GOWN STRL REUS W/ TWL XL LVL3 (GOWN DISPOSABLE) ×3 IMPLANT
GOWN STRL REUS W/TWL LRG LVL3 (GOWN DISPOSABLE) ×2
GOWN STRL REUS W/TWL XL LVL3 (GOWN DISPOSABLE) ×6
HANDPIECE INTERPULSE COAX TIP (DISPOSABLE) ×2
IMMOBILIZER KNEE 22 UNIV (SOFTGOODS) IMPLANT
KIT BASIN OR (CUSTOM PROCEDURE TRAY) ×2 IMPLANT
KIT MANIFOLD (MISCELLANEOUS) ×2 IMPLANT
KIT ROOM TURNOVER OR (KITS) ×2 IMPLANT
MANIFOLD NEPTUNE II (INSTRUMENTS) ×2 IMPLANT
NS IRRIG 1000ML POUR BTL (IV SOLUTION) ×2 IMPLANT
PACK TOTAL JOINT (CUSTOM PROCEDURE TRAY) ×2 IMPLANT
PACK UNIVERSAL I (CUSTOM PROCEDURE TRAY) ×2 IMPLANT
PAD ARMBOARD 7.5X6 YLW CONV (MISCELLANEOUS) ×4 IMPLANT
SET HNDPC FAN SPRY TIP SCT (DISPOSABLE) ×1 IMPLANT
STRIP CLOSURE SKIN 1/2X4 (GAUZE/BANDAGES/DRESSINGS) ×4 IMPLANT
SUCTION FRAZIER TIP 10 FR DISP (SUCTIONS) ×2 IMPLANT
SUT MNCRL AB 3-0 PS2 18 (SUTURE) ×2 IMPLANT
SUT VIC AB 0 CT1 27 (SUTURE) ×4
SUT VIC AB 0 CT1 27XBRD ANBCTR (SUTURE) ×2 IMPLANT
SUT VIC AB 1 CT1 27 (SUTURE) ×6
SUT VIC AB 1 CT1 27XBRD ANBCTR (SUTURE) ×3 IMPLANT
SUT VIC AB 2-0 CT1 27 (SUTURE) ×4
SUT VIC AB 2-0 CT1 TAPERPNT 27 (SUTURE) ×2 IMPLANT
TOWEL OR 17X24 6PK STRL BLUE (TOWEL DISPOSABLE) ×2 IMPLANT
TOWEL OR 17X26 10 PK STRL BLUE (TOWEL DISPOSABLE) ×2 IMPLANT
TRAY FOLEY CATH 16FRSI W/METER (SET/KITS/TRAYS/PACK) IMPLANT
WATER STERILE IRR 1000ML POUR (IV SOLUTION) ×4 IMPLANT

## 2014-03-30 NOTE — Anesthesia Procedure Notes (Signed)
Spinal Patient location during procedure: OR Start time: 03/30/2014 2:33 PM End time: 03/30/2014 2:43 PM Staffing Anesthesiologist: Heather RobertsSINGER, Meghan Alvarez Performed by: anesthesiologist  Preanesthetic Checklist Completed: patient identified, surgical consent, pre-op evaluation, timeout performed, IV checked, risks and benefits discussed and monitors and equipment checked Spinal Block Patient position: sitting Prep: Betadine Patient monitoring: cardiac monitor, continuous pulse ox and blood pressure Approach: midline Injection technique: single-shot Needle Needle type: Pencan  Needle gauge: 24 G Needle length: 9 cm Additional Notes Functioning IV was confirmed and monitors were applied. Sterile prep and drape, including hand hygiene and sterile gloves were used. The patient was positioned and the spine was prepped. The skin was anesthetized with lidocaine.  Free flow of clear CSF was obtained prior to injecting local anesthetic into the CSF.  The spinal needle aspirated freely following injection.  The needle was carefully withdrawn.  The patient tolerated the procedure well.

## 2014-03-30 NOTE — Progress Notes (Signed)
Orthopedic Tech Progress Note Patient Details:  Meghan Alvarez 01/24/1934 [id number]  CPM Right Knee CPM Right Knee: On Right Knee Flexion (Degrees): 60 Right Knee Extension (Degrees): 0  Ortho Devices Ortho Device/Splint Location: footsie roll, and ohf applied to bed Ortho Device/Splint Interventions: Ordered, Application   Jennye MoccasinHughes, Daneil Beem Craig 03/30/2014, 5:30 PM

## 2014-03-30 NOTE — Interval H&P Note (Signed)
History and Physical Interval Note:  03/30/2014 2:28 PM  Meghan Alvarez  has presented today for surgery, with the diagnosis of right knee osteoarthritis  The various methods of treatment have been discussed with the patient and family. After consideration of risks, benefits and other options for treatment, the patient has consented to  Procedure(s): RIGHT TOTAL KNEE ARTHROPLASTY (Right) as a surgical intervention .  The patient's history has been reviewed, patient examined, no change in status, stable for surgery.  I have reviewed the patient's chart and labs.  Questions were answered to the patient's satisfaction.     Mileydi Milsap,STEVEN R

## 2014-03-30 NOTE — Progress Notes (Signed)
ANTICOAGULATION CONSULT NOTE - Initial Consult  Pharmacy Consult for Coumadin Indication: VTE prophylaxis s/p R TKA   Allergies  Allergen Reactions  . Azithromycin Nausea And Vomiting  . Codeine Nausea And Vomiting    Patient Measurements: Height: 5' 5.5" (166.4 cm) Weight: 183 lb (83.008 kg) IBW/kg (Calculated) : 58.15  Vital Signs: Temp: 97.6 F (36.4 C) (03/18 1800) Temp Source: Oral (03/18 1309) BP: 178/87 mmHg (03/18 1800) Pulse Rate: 70 (03/18 1800)  Labs:  From 03/19/14 Hgb 14.6, PLTC 265, INR 1.01   Estimated Creatinine Clearance: 52.2 mL/min (by C-G formula based on Cr of 0.97).   Medical History: Past Medical History  Diagnosis Date  . Complication of anesthesia   . PONV (postoperative nausea and vomiting)   . Fibromyalgia   . Arthritis     Medications:  Prescriptions prior to admission  Medication Sig Dispense Refill Last Dose  . alendronate (FOSAMAX) 70 MG tablet Take 70 mg by mouth once a week. On Sundays  2 Past Week at Unknown time  . methocarbamol (ROBAXIN) 750 MG tablet Take 750 mg by mouth 2 (two) times daily as needed for muscle spasms.   0 03/30/2014 at 1100  . Milnacipran (SAVELLA) 50 MG TABS tablet Take 50 mg by mouth 2 (two) times daily.     . Multiple Vitamin (MULTIVITAMIN WITH MINERALS) TABS tablet Take 1 tablet by mouth daily. Centrum   Past Week at Unknown time  . Omega-3 Fatty Acids (FISH OIL) 1000 MG CAPS Take 1,000 mg by mouth at bedtime.   Past Week at Unknown time  . vitamin B-12 (CYANOCOBALAMIN) 1000 MCG tablet Take 1,000 mcg by mouth daily.   Past Week at Unknown time  . Vitamin D, Ergocalciferol, (DRISDOL) 50000 UNITS CAPS capsule Take 50,000 Units by mouth once a week. On Sundays  0 Past Week at Unknown time  . traMADol (ULTRAM) 50 MG tablet Take by mouth every 6 (six) hours as needed.   More than a month at Unknown time    Assessment: 80 yo F s/p R TKA on 03/30/14 to start Coumadin for post-op VTE prophylaxis. Baseline INR 1.01.   Coumadin predictor points = 2.  Goal of Therapy:  INR 2-3 Monitor platelets by anticoagulation protocol: Yes   Plan:  Coumadin 5mg  PO x 1 tonight. Daily INR. Coumadin book and video  Toys 'R' UsKimberly Everlie Eble, Pharm.D., BCPS Clinical Pharmacist Pager [phone] 03/30/2014 6:40 PM

## 2014-03-30 NOTE — Brief Op Note (Signed)
03/30/2014  4:45 PM  PATIENT:  Noelle R Rabel  80 y.o. female  PRE-OPERATIVE DIAGNOSIS:  right knee osteoarthritis, end stage  POST-OPERATIVE DIAGNOSIS:  right knee osteoarthritis, end stage  PROCEDURE:  Procedure(s): RIGHT TOTAL KNEE ARTHROPLASTY (Right), DePuy Sigma RP  SURGEON:  Surgeon(s) and Role:    * Beverely LowSteve Marzella Miracle, MD - Primary  PHYSICIAN ASSISTANT:   ASSISTANTS: Thea Gisthomas B Dixon PA-C   ANESTHESIA:   spinal  EBL:  Total I/O In: 1200 [I.V.:1200] Out: 230 [Urine:130; Blood:100]  BLOOD ADMINISTERED:none  DRAINS: none   LOCAL MEDICATIONS USED:  none     SPECIMEN:  No Specimen  DISPOSITION OF SPECIMEN:  N/A  COUNTS:  YES  TOURNIQUET:   Total Tourniquet Time Documented: Thigh (Right) - 82 minutes Total: Thigh (Right) - 82 minutes   DICTATION: .Other Dictation: Dictation Number [id number]  PLAN OF CARE: Admit to inpatient   PATIENT DISPOSITION:  PACU - hemodynamically stable.   Delay start of Pharmacological VTE agent (>24hrs) due to surgical blood loss or risk of bleeding: no

## 2014-03-30 NOTE — Anesthesia Preprocedure Evaluation (Addendum)
Anesthesia Evaluation  Patient identified by MRN, date of birth, ID band Patient awake    Reviewed: Allergy & Precautions, NPO status , Patient's Chart, lab work & pertinent test results  History of Anesthesia Complications (+) PONV and history of anesthetic complications  Airway Mallampati: II  TM Distance: >3 FB Neck ROM: Full    Dental  (+) Teeth Intact, Dental Advisory Given   Pulmonary former smoker,    Pulmonary exam normal       Cardiovascular negative cardio ROS      Neuro/Psych  Neuromuscular disease negative psych ROS   GI/Hepatic negative GI ROS, Neg liver ROS,   Endo/Other  negative endocrine ROS  Renal/GU negative Renal ROS     Musculoskeletal   Abdominal   Peds  Hematology negative hematology ROS (+)   Anesthesia Other Findings   Reproductive/Obstetrics                            Anesthesia Physical Anesthesia Plan  ASA: III  Anesthesia Plan: MAC and Spinal   Post-op Pain Management:    Induction:   Airway Management Planned: Simple Face Mask  Additional Equipment:   Intra-op Plan:   Post-operative Plan:   Informed Consent: I have reviewed the patients History and Physical, chart, labs and discussed the procedure including the risks, benefits and alternatives for the proposed anesthesia with the patient or authorized representative who has indicated his/her understanding and acceptance.   Dental advisory given  Plan Discussed with: CRNA, Anesthesiologist and Surgeon  Anesthesia Plan Comments:        Anesthesia Quick Evaluation

## 2014-03-30 NOTE — Plan of Care (Signed)
Problem: Consults Goal: Diagnosis- Total Joint Replacement Primary Total Knee Right     

## 2014-03-30 NOTE — Transfer of Care (Signed)
Immediate Anesthesia Transfer of Care Note  Patient: Meghan Alvarez  Procedure(s) Performed: Procedure(s): RIGHT TOTAL KNEE ARTHROPLASTY (Right)  Patient Location: PACU  Anesthesia Type:MAC and Spinal  Level of Consciousness: awake, alert  and oriented  Airway & Oxygen Therapy: Patient Spontanous Breathing  Post-op Assessment: Report given to RN and Post -op Vital signs reviewed and stable  Post vital signs: Reviewed and stable  Last Vitals:  Filed Vitals:   03/30/14 1309  BP: 152/95  Pulse: 87  Temp: 36.6 C    Complications: No apparent anesthesia complications

## 2014-03-30 NOTE — Anesthesia Postprocedure Evaluation (Signed)
Anesthesia Post Note  Patient: Meghan Alvarez  Procedure(s) Performed: Procedure(s) (LRB): RIGHT TOTAL KNEE ARTHROPLASTY (Right)  Anesthesia type: MAC/SAB  Patient location: PACU  Post pain: Pain level controlled  Post assessment: Patient's Cardiovascular Status Stable  Last Vitals:  Filed Vitals:   03/30/14 1715  BP: 140/67  Pulse: 71  Temp:   Resp: 10    Post vital signs: Reviewed and stable  Level of consciousness: sedated  Complications: No apparent anesthesia complications

## 2014-03-30 NOTE — Progress Notes (Signed)
PHARMACIST - PHYSICIAN COMMUNICATION  CONCERNING: P&T Medication Policy Regarding Oral Bisphosphonates  RECOMMENDATION: Your order for alendronate (Fosamax), ibandronate (Boniva), or risedronate (Actonel) has been discontinued at this time.  If the patient's post-hospital medical condition warrants safe use of this class of drugs, please resume the pre-hospital regimen upon discharge.  DESCRIPTION:  Alendronate (Fosamax), ibandronate (Boniva), and risedronate (Actonel) can cause severe esophageal erosions in patients who are unable to remain upright at least 30 minutes after taking this medication.   Since brief interruptions in therapy are thought to have minimal impact on bone mineral density, the Pharmacy & Therapeutics Committee has established that bisphosphonate orders should be routinely discontinued during hospitalization.   To override this safety policy and permit administration of Boniva, Fosamax, or Actonel in the hospital, prescribers must write "DO NOT HOLD" in the comments section when placing the order for this class of medications.  Sheppard CoilFrank Brinlynn Gorton PharmD., BCPS Clinical Pharmacist Pager [phone] 03/30/2014 6:27 PM

## 2014-03-31 LAB — BASIC METABOLIC PANEL
Anion gap: 12 (ref 5–15)
BUN: 5 mg/dL — ABNORMAL LOW (ref 6–23)
CO2: 20 mmol/L (ref 19–32)
CREATININE: 0.83 mg/dL (ref 0.50–1.10)
Calcium: 8.7 mg/dL (ref 8.4–10.5)
Chloride: 102 mmol/L (ref 96–112)
GFR calc Af Amer: 77 mL/min — ABNORMAL LOW (ref >90)
GFR, EST NON AFRICAN AMERICAN: 66 mL/min — AB (ref >90)
Glucose, Bld: 193 mg/dL — ABNORMAL HIGH (ref 70–99)
POTASSIUM: 4 mmol/L (ref 3.5–5.1)
SODIUM: 134 mmol/L — AB (ref 135–145)

## 2014-03-31 LAB — CBC
HCT: 40.7 % (ref 36.0–46.0)
Hemoglobin: 13.7 g/dL (ref 12.0–15.0)
MCH: 31.7 pg (ref 26.0–34.0)
MCHC: 33.7 g/dL (ref 30.0–36.0)
MCV: 94.2 fL (ref 78.0–100.0)
PLATELETS: 261 10*3/uL (ref 150–400)
RBC: 4.32 MIL/uL (ref 3.87–5.11)
RDW: 13.2 % (ref 11.5–15.5)
WBC: 13.8 10*3/uL — AB (ref 4.0–10.5)

## 2014-03-31 LAB — PROTIME-INR
INR: 1.12 (ref 0.00–1.49)
Prothrombin Time: 14.6 seconds (ref 11.6–15.2)

## 2014-03-31 MED ORDER — WARFARIN SODIUM 5 MG PO TABS
5.0000 mg | ORAL_TABLET | Freq: Once | ORAL | Status: AC
  Administered 2014-03-31: 5 mg via ORAL
  Filled 2014-03-31: qty 1

## 2014-03-31 MED ORDER — HYDROMORPHONE HCL 1 MG/ML IJ SOLN
1.0000 mg | INTRAMUSCULAR | Status: DC | PRN
  Administered 2014-03-31: 1 mg via INTRAVENOUS
  Filled 2014-03-31: qty 1

## 2014-03-31 MED ORDER — OXYCODONE HCL 5 MG PO TABS
10.0000 mg | ORAL_TABLET | ORAL | Status: DC | PRN
  Administered 2014-03-31 (×4): 15 mg via ORAL
  Administered 2014-04-01 (×2): 10 mg via ORAL
  Administered 2014-04-01 (×2): 15 mg via ORAL
  Administered 2014-04-02: 10 mg via ORAL
  Filled 2014-03-31 (×2): qty 2
  Filled 2014-03-31 (×6): qty 3
  Filled 2014-03-31: qty 2

## 2014-03-31 NOTE — Evaluation (Signed)
Physical Therapy Evaluation Patient Details Name: Meghan Alvarez R Breeding MRN: [id number] DOB: 01/10/1935 Today's Date: 03/31/2014   History of Present Illness  s/p RIGHT TOTAL KNEE ARTHROPLASTY   Clinical Impression  Pt is s/p TKA resulting in the deficits listed below (see PT Problem List). Eval/mobility limited by nausea and lethargy, suspect due to meds. Pt will benefit from skilled PT to increase their independence and safety with mobility to allow discharge to the venue listed below. [facility]     Follow Up Recommendations SNF;Supervision/Assistance - 24 hour ([facility]    Equipment Recommendations  Rolling walker with 5" wheels    Recommendations for Other Services       Precautions / Restrictions Precautions Precautions: Knee;Fall Precaution Comments: Reviewed no pillow under knee with patient and family Restrictions Weight Bearing Restrictions: Yes RLE Weight Bearing: Weight bearing as tolerated      Mobility  Bed Mobility Overal bed mobility: Needs Assistance;+2 for physical assistance Bed Mobility: Supine to Sit     Supine to sit: Max assist     General bed mobility comments: Pt nauseated and unable to assist to her full potential with supine to sit.  Transfers Overall transfer level: Needs assistance Equipment used: Rolling walker (2 wheeled) Transfers: Sit to/from [employer] CorporationStand;Stand Pivot Transfers Sit to Stand: +2 physical assistance;Mod assist Stand pivot transfers: +2 physical assistance;Mod assist       General transfer comment: verbal cues for sequencing and safety  Ambulation/Gait                Stairs            Wheelchair Mobility    Modified Rankin (Stroke Patients Only)       Balance Overall balance assessment: Needs assistance Sitting-balance support: Bilateral upper extremity supported;Feet supported Sitting balance-Leahy Scale: Fair     Standing balance  support: Bilateral upper extremity supported;During functional activity Standing balance-Leahy Scale: Poor                               Pertinent Vitals/Pain Pain Assessment: Faces Faces Pain Scale: Hurts little more Pain Location: RLE Pain Descriptors / Indicators: Grimacing Pain Intervention(s): Limited activity within patient's tolerance;Premedicated before session;Repositioned    Home Living Family/patient expects to be discharged to:: Skilled nursing facility Living Arrangements: Spouse/significant other                    Prior Function Level of Independence: Independent               Hand Dominance   Dominant Hand: Right    Extremity/Trunk Assessment   Upper Extremity Assessment: Overall WFL for tasks assessed           Lower Extremity Assessment: RLE deficits/detail RLE Deficits / Details: Attempted ROM right knee but pt unable to tolerate    Cervical / Trunk Assessment: Normal  Communication   Communication: No difficulties  Cognition Arousal/Alertness: Lethargic;Suspect due to medications Behavior During Therapy: Anxious Overall Cognitive Status: Within Functional Limits for tasks assessed                      General Comments      Exercises Total Joint Exercises Goniometric ROM: pt unable to tolerate ROM right knee      Assessment/Plan    PT Assessment Patient needs continued PT services  PT  Diagnosis Difficulty walking;Acute pain   PT Problem List Decreased strength;Decreased range of motion;Decreased activity tolerance;Decreased balance;Decreased mobility;Pain;Decreased knowledge of precautions;Decreased safety awareness;Decreased knowledge of use of DME  PT Treatment Interventions DME instruction;Gait training;Functional mobility training;Therapeutic activities;Therapeutic exercise;Balance training;Patient/family education;Stair training   PT Goals (Current goals can be found in the Care Plan section) Acute  Rehab PT Goals Patient Stated Goal: none stated PT Goal Formulation: With patient Time For Goal Achievement: 04/14/14 Potential to Achieve Goals: Good    Frequency 7X/week   Barriers to discharge        Co-evaluation               End of Session Equipment Utilized During Treatment: Gait belt;Right knee immobilizer Activity Tolerance: Patient limited by pain;Treatment limited secondary to medical complications (Comment) (nausea) Patient left: in chair;with family/visitor present;with call bell/phone within reach Nurse Communication: Mobility status         Time: 7829-5621 PT Time Calculation (min) (ACUTE ONLY): 18 min   Charges:   PT Evaluation $Initial PT Evaluation Tier I: 1 Procedure     PT G CodesIlda Foil 03/31/2014, 2:57 PM

## 2014-03-31 NOTE — Care Management Note (Signed)
CARE MANAGEMENT NOTE 03/31/2014  [name]   Account Number:  [id number]  Date Initiated:  03/31/2014  Documentation initiated by:  Georgianne FickADEKUNLE,Roshad Hack  Subjective/Objective Assessment:   Right Knee Replacement     Action/Plan:   Patient is still undergoing PT eval.  [facility]   Anticipated DC Date:  04/01/2014   Anticipated DC Plan:  SKILLED NURSING FACILITY  In-house referral  Clinical Social Worker      DC Planning Services  CM consult      Choice offered to / List presented to:             Status of service:   Medicare Important Message given?   (If response is "NO", the following Medicare IM given date fields will be blank) Date Medicare IM given:   Medicare IM given by:   Date Additional Medicare IM given:   Additional Medicare IM given by:    Discharge Disposition:    Per UR Regulation:    If discussed at Long Length of Stay Meetings, dates discussed:    Comments:  03/31/2014  1030  CM spoke with patient in her inpatient room, alert and oriented with daughter Enedina FinnerCrystal Alvarez (contact number if needed [phone]) at bedside. Patient states she is still working with PT [facility]  CM paged SW Meghan at [phone] and updated her about the patient/family preference.  Georgianne Fickola Hillis Mcphatter, RN, BSN, CCM

## 2014-03-31 NOTE — Evaluation (Signed)
Occupational Therapy Evaluation Patient Details Name: Meghan Alvarez MRN: [id number] DOB: 08/14/1934 Today's Date: 03/31/2014    History of Present Illness s/p RIGHT TOTAL KNEE ARTHROPLASTY    Clinical Impression   Patient independent PTA. Patient currently requires total assist for LB ADLs and mod assist for functional mobility and transfers. Patient will benefit from acute OT to increase overall independence in the areas of ADLs, functional mobility, and overall safety in order to safely discharge to venue listed below.     Follow Up Recommendations  SNF;Supervision/Assistance - 24 hour    Equipment Recommendations   (TBD)    Recommendations for Other Services  None at this time   Precautions / Restrictions Precautions Precautions: Knee;Fall Precaution Comments: Reviewed no pillow under knee with patient and family Restrictions Weight Bearing Restrictions: Yes RLE Weight Bearing: Weight bearing as tolerated      Mobility Bed Mobility General bed mobility comments: Patient found seated in recliner upon OT entering room, please see PT eval for more information  Transfers Overall transfer level: Needs assistance Equipment used: Rolling walker (2 wheeled) Transfers: Sit to/from [employer] CorporationStand;Stand Pivot Transfers Sit to Stand: Mod assist Stand pivot transfers: Mod assist       General transfer comment: Cues to stay awake, hand placement, and overall safety    Balance Overall balance assessment: Needs assistance Sitting-balance support: Bilateral upper extremity supported;Feet supported Sitting balance-Leahy Scale: Fair     Standing balance support: Bilateral upper extremity supported;During functional activity Standing balance-Leahy Scale: Poor    ADL Overall ADL's : Needs assistance/impaired Eating/Feeding: Set up;Sitting   Grooming: Set up;Sitting   Upper Body Bathing: Set up;Sitting   Lower Body Bathing: Total assistance;Sit to/from stand   Upper Body  Dressing : Set up;Sitting   Lower Body Dressing: Total assistance;Sit to/from stand   Toilet Transfer: Moderate assistance;RW;BSC   Toileting- Clothing Manipulation and Hygiene: Minimal assistance;Sit to/from stand       Functional mobility during ADLs: Moderate assistance;Rolling walker General ADL Comments: Patient very limited by lethargy, as patient unable to keep eyes open during most of session. With cues and saying name patient would open eyes. Family present (husband, daughter, son-in-law). Patient with request to use bathroom, patient stood with RW and performed stand pivot> BSC. Patient stated she went, but nothing in toilet. Patient appropriate for SNF at this tiem.     Pertinent Vitals/Pain Pain Assessment: Faces Faces Pain Scale: Hurts little more Pain Location: RLE Pain Descriptors / Indicators: Grimacing Pain Intervention(s): Limited activity within patient's tolerance;Premedicated before session;Repositioned     Hand Dominance Right   Extremity/Trunk Assessment Upper Extremity Assessment Upper Extremity Assessment: Overall WFL for tasks assessed   Lower Extremity Assessment Lower Extremity Assessment: Defer to PT evaluation   Cervical / Trunk Assessment Cervical / Trunk Assessment: Normal   Communication Communication Communication: No difficulties   Cognition Arousal/Alertness: Lethargic;Suspect due to medications Behavior During Therapy: [name] for tasks assessed/performed Overall Cognitive Status: Within Functional Limits for tasks assessed             Home Living Family/patient expects to be discharged to:: Skilled nursing facility [facility] Living Arrangements: Spouse/significant other   Prior Functioning/Environment Level of Independence: Independent     OT Diagnosis: Generalized weakness;Acute pain   OT Problem List: Decreased strength;Decreased range of motion;Decreased activity tolerance;Impaired balance (sitting and/or  standing);Decreased safety awareness;Decreased knowledge of use of DME or AE;Decreased knowledge of precautions;Pain   OT Treatment/Interventions: Self-care/ADL training;Therapeutic exercise;Energy conservation;DME and/or AE instruction;Therapeutic activities;Patient/family education;Balance training  OT Goals(Current goals can be found in the care plan section) Acute Rehab OT Goals Patient Stated Goal: none stated OT Goal Formulation: With patient/family Time For Goal Achievement: 04/07/14 Potential to Achieve Goals: Good  OT Frequency: Min 2X/week   Barriers to D/C: None at this time          End of Session Equipment Utilized During Treatment: Gait belt;Rolling walker;Right knee immobilizer CPM Right Knee CPM Right Knee: Off  Activity Tolerance: Patient limited by lethargy Patient left: in chair;with call bell/phone within reach;with family/visitor present   Time: 9604-5409 OT Time Calculation (min): 32 min Charges:  OT General Charges $OT Visit: 1 Procedure OT Evaluation $Initial OT Evaluation Tier I: 1 Procedure OT Treatments $Self Care/Home Management : 8-22 mins  Tayleigh Wetherell , MS, OTR/L, CLT Pager: [phone]  03/31/2014, 2:04 PM

## 2014-03-31 NOTE — Op Note (Signed)
[name]:  Meghan Alvarez, Meghan Alvarez              ACCOUNT NO.:  [id number]  MEDICAL RECORD NO.:  [id number]  LOCATION:  5N01C                        FACILITY:  [facility]  PHYSICIAN:  Almedia BallsSteven R. Ranell PatrickNorris, M.D. DATE OF BIRTH:  10-22-1934  DATE OF PROCEDURE:  03/30/2014 DATE OF DISCHARGE:                              OPERATIVE REPORT   PREOPERATIVE DIAGNOSIS:  Right knee end-stage osteoarthritis.  POSTOPERATIVE DIAGNOSIS:  Right knee end-stage osteoarthritis.  PROCEDURE: Right total knee replacement using DePuy Sigma rotating platform prosthesis.  ATTENDING SURGEON:  Almedia BallsSteven R. Ranell PatrickNorris, MD.  ASSISTANT:  Donnie Coffinhomas B. Dixon, PA-C, who scrubbed the entire procedure and necessary for satisfactory completion of surgery.  Spinal anesthesia was used.  ESTIMATED BLOOD LOSS:  Less than 200 mL.  FLUID REPLACEMENT:  1000 mL crystalloid.  Instrument counts correct.  There were no complications.  ANTIBIOTICS:  Perioperative antibiotics were given.  INDICATIONS:  The patient is a 80 year old female with worsening knee pain in the right knee secondary to end-stage arthritis.  The patient has bone-on-bone on x-ray and has limited function and increasing pain despite conservative measures for her knee including modification activities, anti-inflammatories injections, and presents now for total knee arthroplasty to restore function limiting knee pain to her knee and she improve her mobility and independence.  Informed consent obtained.  DESCRIPTION OF PROCEDURE:  After an adequate level of anesthesia was achieved, the patient was positioned supine on the operating room table. Right leg correctly identified and sterilely prepped and draped in usual manner.  Time-out was called.  The patient's knee was flexed up and we did elevate the leg exsanguinated using Esmarch bandage, elevated the tourniquet to 300 mmHg.  Flexed the knee, and I tested 1st to make sure the spinal block was affected.  We then did a midline  incision with a 10 blade scalpel.  Dissection down through subcutaneous tissues using a 10 blade.  We identified the median parapatellar tissues and using a fresh 10 blade, performed a medial parapatellar arthrotomy.  I divided the lateral patellofemoral ligaments and everted the patella.  We then entered the distal femur with a step-cut drill.  Placed her in an intramedullary resection guide set on 10 mm 5 degrees right.  Once we had resected the distal femur, we went ahead and sized the femur to a size 3 anterior down.  Performed our anterior, posterior, and chamfer cuts with a 4-in-1 block.  Resected ACL, PCL, and remaining meniscal tissue, subluxed the tibia anteriorly and performed our proximal tibial cut with the extramedullary guide perpendicular to long axis of the tibia with minimal posterior slope for this posterior cruciate substituting prosthesis.  Once we performed that cut, we checked our gaps, which were symmetric at 10 mm.  We then went ahead and removed the excess posterior osteophytes off the posterior femoral condyles with lamina spreader and an osteotome and mallet.  We then went finished our tibial preparation with the modular drill and keel punch for the size 3 tibia which had good coverage.  We next went ahead and did our box cut for the femur and then placed our posterior cruciate substituting size 3 right femur prosthesis in place impacting that and then reduced the  knee with a +10 polyethylene insert.  Once we snapped the knee, the poly in place.  We found that the knee was quite stable and had a good range of motion.  Felt like we propagated 12.5 for the real.  We then resurfaced the patella going from a 22 mm thickness down to a 16 mm thickness and used a patellar resection guide with an oscillating saw.  We then drilled our lugs for the size 35 patellar button.  We placed the button, ranged the knee.  We were happy with the soft tissue balance and the patellar  tracking.  We then removed all trial components, pulse irrigated the knee and cemented the components into place with DePuy high viscosity cement.  Once the cement was in place, we placed a 10 poly insert and placed in extension, allowed the cement to harden.  We also had a patellar clamp in place for the patella.  Once all cement was hardened, we removed excess cement with quarter-inch curved osteotome. We irrigated the knee fully.  We then selected three 12.5 poly insert and put that into place, had a nice snap on the medial side and was able to achieve full extension was nice and stable in flexion.  Patellar tracking was perfect.  We irrigated again with pulse irrigator and then closed the parapatellar arthrotomy with #1 Vicryl suture followed by 0 Vicryl and 2-0 Vicryl subcutaneous closure, and running 4-0 Monocryl for skin.  Steri-Strips applied followed by sterile dressing.  The patient tolerated the surgery well.     Almedia Balls. Ranell Patrick, M.D.     SRN/MEDQ  D:  03/30/2014  T:  03/31/2014  Job:  [id number]

## 2014-03-31 NOTE — Progress Notes (Signed)
Orthopedic Tech Progress Note Patient Details:  Meghan Alvarez 02/15/1934 [id number] On cpm at 7:00 pm Patient ID: Meghan Lawlessatsy R Garretson, female   DOB: 09/05/1934, 80 y.o.   MRN: [id number]   Jennye MoccasinHughes, Katelynne Revak Craig 03/31/2014, 7:07 PM

## 2014-03-31 NOTE — Progress Notes (Signed)
On call PA contacted regarding BP = 180/85, no new orders received.  Follow up BP = 151/85.

## 2014-03-31 NOTE — Progress Notes (Signed)
Orthopedics Progress Note  Subjective: Patient reports no sleep and pain not well controlled this morning  Objective:  Filed Vitals:   03/31/14 0500  BP: 169/70  Pulse: 93  Temp: 99.8 F (37.7 C)  Resp: 16    General: Awake and alert  Musculoskeletal: right knee dressing CDI, calf pumps without pain, min swelling Neurovascularly intact  Lab Results  Component Value Date   WBC 13.8* 03/31/2014   HGB 13.7 03/31/2014   HCT 40.7 03/31/2014   MCV 94.2 03/31/2014   PLT 261 03/31/2014       Component Value Date/Time   NA 140 03/19/2014 1452   K 4.4 03/19/2014 1452   CL 107 03/19/2014 1452   CO2 25 03/19/2014 1452   GLUCOSE 98 03/19/2014 1452   BUN 9 03/19/2014 1452   CREATININE 0.97 03/19/2014 1452   CALCIUM 10.4 03/19/2014 1452   GFRNONAA 55* 03/19/2014 1452   GFRAA 64* 03/19/2014 1452    Lab Results  Component Value Date   INR 1.01 03/19/2014   INR 2.1* 08/05/2006   INR 1.4 08/04/2006    Assessment/Plan: POD #1 s/p Procedure(s): RIGHT TOTAL KNEE ARTHROPLASTY Will work on improved pain control. PT, OT, D/C planning DVT prophylaxis with mechanical and coumadin  Almedia BallsSteven R. Ranell PatrickNorris, MD 03/31/2014 7:07 AM

## 2014-03-31 NOTE — Clinical Social Work Psychosocial (Signed)
Clinical Social Work Department BRIEF PSYCHOSOCIAL ASSESSMENT 03/31/2014  Patient:  Meghan Alvarez, Meghan Alvarez     Account Number:  [id number]     Admit date:  03/30/2014  Clinical Social Worker:  Hubert Azure  Date/Time:  03/31/2014 04:09 PM  Referred by:  Physician  Date Referred:  03/31/2014 Referred for  SNF Placement   Other Referral:   Interview type:  Patient Other interview type:   Patient's husband Meghan Alvarez was also present at bedside.    PSYCHOSOCIAL DATA Living Status:  HUSBAND Admitted from facility:   Level of care:   Primary support name:  Meghan Alvarez (husband) Primary support relationship to patient:  SPOUSE Degree of support available:   Good    CURRENT CONCERNS Current Concerns  Post-Acute Placement   Other Concerns:    SOCIAL WORK ASSESSMENT / PLAN CSW met with patient and husband who was present at bedside. CSW introduced self and explained role. CSW explained SNF placement process and discussed d/c plan with patient and husband. Per patient, she is agreeable to SNF placement and prefers [facility]. CSW acknowledged patient's preference and educated patient on having alternate facilities in the event preference facility is not available. Patient declined to provide alternate facility.   Assessment/plan status:  Other - See comment Other assessment/ plan:   CSW to submit PASARR and complete FL2 for placement.   Information/referral to community resources:    PATIENT'S/FAMILY'S RESPONSE TO PLAN OF CARE: Patient agreeable to SNF placement and prefers [facility].    [place], [place] Weekend Clinical Social Worker [phone]

## 2014-03-31 NOTE — Progress Notes (Signed)
Orthopedic Tech Progress Note Patient Details:  Meghan Alvarez 01/05/1935 [id number]  Patient ID: Meghan Alvarez, female   DOB: 02/09/1934, 80 y.o.   MRN: [id number] Both pt and family member stated that pt is unable to use trapeze bar patient helper; RN notified  Nikki DomCrawford, Alece Koppel 03/31/2014, 9:04 AM

## 2014-03-31 NOTE — Progress Notes (Signed)
ANTICOAGULATION CONSULT NOTE  Pharmacy Consult for Coumadin Indication: VTE prophylaxis s/p R TKA   Allergies  Allergen Reactions  . Azithromycin Nausea And Vomiting  . Codeine Nausea And Vomiting    Patient Measurements: Height: 5' 5.5" (166.4 cm) Weight: 183 lb (83.008 kg) IBW/kg (Calculated) : 58.15  Vital Signs: Temp: 99.8 F (37.7 C) (03/19 0500) BP: 169/70 mmHg (03/19 0500) Pulse Rate: 93 (03/19 0500)  Labs:  From 03/19/14 Hgb 14.6, PLTC 265, INR 1.01   Estimated Creatinine Clearance: 61 mL/min (by C-G formula based on Cr of 0.83).   Assessment: 80 yo F s/p R TKA on 03/30/14 to start Coumadin for post-op VTE prophylaxis. Baseline INR 1.01.  Coumadin predictor points = 2.  INR today = 1.12  Goal of Therapy:  INR 2-3 Monitor platelets by anticoagulation protocol: Yes   Plan:  Coumadin 5mg  PO x 1 tonight. Daily INR.  Thank you. Okey RegalLisa Graycie Halley, PharmD [phone]  03/31/2014 9:52 AM

## 2014-03-31 NOTE — Discharge Instructions (Signed)
Weight bearing as tolerated on the right leg.  Do exercises every hour while awake.  Heel slides, dangles off the bed, calf pumps, quad sets(tightening)  Keep the incision clean and dry and covered for one week, then ok to get wet in the shower.  Follow up in two weeks in the office  [phone]   Information on my medicine - Coumadin   (Warfarin)  This medication education was reviewed with me or my healthcare representative as part of my discharge preparation.   Why was Coumadin prescribed for you? Coumadin was prescribed for you because you have a blood clot or a medical condition that can cause an increased risk of forming blood clots. Blood clots can cause serious health problems by blocking the flow of blood to the heart, lung, or brain. Coumadin can prevent harmful blood clots from forming. As a reminder your indication for Coumadin is:   Blood Clot Prevention After Orthopedic Surgery  What test will check on my response to Coumadin? While on Coumadin (warfarin) you will need to have an INR test regularly to ensure that your dose is keeping you in the desired range. The INR (international normalized ratio) number is calculated from the result of the laboratory test called prothrombin time (PT).  If an INR APPOINTMENT HAS NOT ALREADY BEEN MADE FOR YOU please schedule an appointment to have this lab work done by your health care provider within 7 days. Your INR goal is usually a number between:  2 to 3 or your provider may give you a more narrow range like 2-2.5.  Ask your health care provider during an office visit what your goal INR is.  What  do you need to  know  About  COUMADIN? Take Coumadin (warfarin) exactly as prescribed by your healthcare provider about the same time each day.  DO NOT stop taking without talking to the doctor who prescribed the medication.  Stopping without other blood clot prevention medication to take the place of Coumadin may increase your risk of developing a  new clot or stroke.  Get refills before you run out.  What do you do if you miss a dose? If you miss a dose, take it as soon as you remember on the same day then continue your regularly scheduled regimen the next day.  Do not take two doses of Coumadin at the same time.  Important Safety Information A possible side effect of Coumadin (Warfarin) is an increased risk of bleeding. You should call your healthcare provider right away if you experience any of the following: ? Bleeding from an injury or your nose that does not stop. ? Unusual colored urine (red or dark brown) or unusual colored stools (red or black). ? Unusual bruising for unknown reasons. ? A serious fall or if you hit your head (even if there is no bleeding).  Some foods or medicines interact with Coumadin (warfarin) and might alter your response to warfarin. To help avoid this: ? Eat a balanced diet, maintaining a consistent amount of Vitamin K. ? Notify your provider about major diet changes you plan to make. ? Avoid alcohol or limit your intake to 1 drink for women and 2 drinks for men per day. (1 drink is 5 oz. wine, 12 oz. beer, or 1.5 oz. liquor.)  Make sure that ANY health care provider who prescribes medication for you knows that you are taking Coumadin (warfarin).  Also make sure the healthcare provider who is monitoring your Coumadin knows when you  have started a new medication including herbals and non-prescription products.  Coumadin (Warfarin)  Major Drug Interactions  Increased Warfarin Effect Decreased Warfarin Effect  Alcohol (large quantities) Antibiotics (esp. Septra/Bactrim, Flagyl, Cipro) Amiodarone (Cordarone) Aspirin (ASA) Cimetidine (Tagamet) Megestrol (Megace) NSAIDs (ibuprofen, naproxen, etc.) Piroxicam (Feldene) Propafenone (Rythmol SR) Propranolol (Inderal) Isoniazid (INH) Posaconazole (Noxafil) Barbiturates (Phenobarbital) Carbamazepine (Tegretol) Chlordiazepoxide (Librium) Cholestyramine  (Questran) Griseofulvin Oral Contraceptives Rifampin Sucralfate (Carafate) Vitamin K   Coumadin (Warfarin) Major Herbal Interactions  Increased Warfarin Effect Decreased Warfarin Effect  Garlic Ginseng Ginkgo biloba Coenzyme Q10 Green tea St. Johns wort    Coumadin (Warfarin) FOOD Interactions  Eat a consistent number of servings per week of foods HIGH in Vitamin K (1 serving =  cup)  Collards (cooked, or boiled & drained) Kale (cooked, or boiled & drained) Mustard greens (cooked, or boiled & drained) Parsley *serving size only =  cup Spinach (cooked, or boiled & drained) Swiss chard (cooked, or boiled & drained) Turnip greens (cooked, or boiled & drained)  Eat a consistent number of servings per week of foods MEDIUM-HIGH in Vitamin K (1 serving = 1 cup)  Asparagus (cooked, or boiled & drained) Broccoli (cooked, boiled & drained, or raw & chopped) Brussel sprouts (cooked, or boiled & drained) *serving size only =  cup Lettuce, raw (green leaf, endive, romaine) Spinach, raw Turnip greens, raw & chopped   These websites have more information on Coumadin (warfarin):  http://www.king-russell.com/www.coumadin.com; https://www.hines.net/www.ahrq.gov/consumer/coumadin.htm;

## 2014-03-31 NOTE — Clinical Social Work Placement (Addendum)
Clinical Social Work Department CLINICAL SOCIAL WORK PLACEMENT NOTE 03/31/2014  Patient:  Meghan Alvarez,Meghan Alvarez  Account Number:  [id number] Admit date:  03/30/2014  Clinical Social Worker:  Vivi Barrackrystal Patrick-jefferson, LCSWA  Date/time:  03/31/2014 04:08 PM  Clinical Social Work is seeking post-discharge placement for this patient at the following level of care:   SKILLED NURSING   (*CSW will update this form in Epic as items are completed)   03/31/2014  Patient/family provided with [facility] of Clinical Social Work's list of facilities offering this level of care within the geographic area requested by the patient (or if unable, by the patient's family).  03/31/2014  Patient/family informed of their freedom to choose among providers that offer the needed level of care, that participate in Medicare, Medicaid or managed care program needed by the patient, have an available bed and are willing to accept the patient.  03/31/2014  Patient/family informed of [facility]' ownership interest in [facility], as well as of the fact that they are under no obligation to receive care at this facility.  PASARR submitted to EDS on 03/31/2014 PASARR number received on 03/31/2014  FL2 transmitted to all facilities in geographic area requested by pt/family on  03/31/2014 FL2 transmitted to all facilities within larger geographic area on   Patient informed that his/her managed care company has contracts with or will negotiate with  certain facilities, including the following:     Patient/family informed of bed offers received:  Completed over weekend. [facility] Deist(Rahima Fleishman, [facility]) Physician recommends and patient chooses bed at    [facility] Marcelline Deist(Kayshaun Polanco, [facility])   Patient to be transferred to facility by [facility] Marcelline Deist(Nikholas Geffre, LCSWA) Patient and family notified of transfer on 04/02/2014 Marcelline Deist(Ciearra Rufo,  Theresia MajorsLCSWA) Name of family member notified:  Billie Territo Lily Kocher(Ronica Vivian, LCSWA)  The following physician request were entered in Epic:   Additional Comments:  Vivi Barrackrystal Patrick-Jefferson, [facility]WA Weekend Clinical Social Worker [phone]  Marcelline Deistmily Jeren Dufrane, [place] [phone]) Licensed Clinical Social Worker Orthopedics [phone]) and Surgical [phone]([id number])

## 2014-04-01 LAB — CBC
HCT: 36.4 % (ref 36.0–46.0)
HEMOGLOBIN: 12 g/dL (ref 12.0–15.0)
MCH: 31.4 pg (ref 26.0–34.0)
MCHC: 33 g/dL (ref 30.0–36.0)
MCV: 95.3 fL (ref 78.0–100.0)
Platelets: 190 10*3/uL (ref 150–400)
RBC: 3.82 MIL/uL — AB (ref 3.87–5.11)
RDW: 13.3 % (ref 11.5–15.5)
WBC: 11.8 10*3/uL — ABNORMAL HIGH (ref 4.0–10.5)

## 2014-04-01 LAB — PROTIME-INR
INR: 1.63 — ABNORMAL HIGH (ref 0.00–1.49)
Prothrombin Time: 19.5 seconds — ABNORMAL HIGH (ref 11.6–15.2)

## 2014-04-01 MED ORDER — GUAIFENESIN ER 600 MG PO TB12
600.0000 mg | ORAL_TABLET | Freq: Two times a day (BID) | ORAL | Status: DC | PRN
  Administered 2014-04-01: 600 mg via ORAL
  Filled 2014-04-01: qty 1

## 2014-04-01 MED ORDER — POLYETHYLENE GLYCOL 3350 17 G PO PACK
17.0000 g | PACK | Freq: Two times a day (BID) | ORAL | Status: DC
  Administered 2014-04-01 – 2014-04-02 (×3): 17 g via ORAL
  Filled 2014-04-01 (×3): qty 1

## 2014-04-01 MED ORDER — DOCUSATE SODIUM 100 MG PO CAPS
100.0000 mg | ORAL_CAPSULE | Freq: Two times a day (BID) | ORAL | Status: DC
  Administered 2014-04-01 – 2014-04-02 (×3): 100 mg via ORAL
  Filled 2014-04-01 (×3): qty 1

## 2014-04-01 MED ORDER — WARFARIN SODIUM 5 MG PO TABS
2.5000 mg | ORAL_TABLET | Freq: Once | ORAL | Status: AC
  Administered 2014-04-01: 2.5 mg via ORAL
  Filled 2014-04-01: qty 1

## 2014-04-01 NOTE — Progress Notes (Signed)
Orthopedic Tech Progress Note Patient Details:  Sheran Lawlessatsy R Unrein 05/18/1934 [id number] On cpm at 7:25 pm Patient ID: Sheran Lawlessatsy R [place], female   DOB: 02/29/1934, 80 y.o.   MRN: [id number]   Jennye MoccasinHughes, Kymora Sciara Craig 04/01/2014, 7:26 PM

## 2014-04-01 NOTE — Progress Notes (Signed)
Physical Therapy Treatment Patient Details Name: Meghan Alvarez MRN: [id number] DOB: 08-25-1934 Today's Date: 04/01/2014    History of Present Illness s/p RIGHT TOTAL KNEE ARTHROPLASTY     PT Comments    Patient still extremely limited by pain in right knee - does not tolerate leg being down, nor ROM.  Patient did do much better with transfers and gait today.  Still unwilling to bear weight on RLE.  Feel SNF is best d/c plan for patient.  Follow Up Recommendations  SNF     Equipment Recommendations  Rolling walker with 5" wheels    Recommendations for Other Services       Precautions / Restrictions Precautions Precautions: Knee;Fall Precaution Comments: Reviewed no pillow under knee with patient and family Restrictions RLE Weight Bearing: Weight bearing as tolerated    Mobility  Bed Mobility Overal bed mobility: Needs Assistance Bed Mobility: Supine to Sit     Supine to sit: Mod assist;+2 for physical assistance     General bed mobility comments: patient wanted to keep her leg elevated throughout mobility  Transfers Overall transfer level: Needs assistance Equipment used: Rolling walker (2 wheeled) Transfers: Sit to/from [employer] Sit to Stand: Mod assist;+2 physical assistance Stand pivot transfers: Mod assist;+2 physical assistance       General transfer comment: again, patient wanted to keep her leg elevated throughout mobility  Ambulation/Gait Ambulation/Gait assistance: Min assist Ambulation Distance (Feet): 10 Feet Assistive device: Rolling walker (2 wheeled) Gait Pattern/deviations: Step-to pattern;Decreased weight shift to right Gait velocity: decreased   General Gait Details: patient not willing to put weight on RLE.     Stairs            Wheelchair Mobility    Modified Rankin (Stroke Patients Only)       Balance Overall balance assessment: Needs assistance Sitting-balance support: No upper extremity  supported Sitting balance-Leahy Scale: Fair     Standing balance support: Bilateral upper extremity supported Standing balance-Leahy Scale: Poor                      Cognition Arousal/Alertness: Awake/alert Behavior During Therapy: Anxious Overall Cognitive Status: Within Functional Limits for tasks assessed                      Exercises Total Joint Exercises Ankle Circles/Pumps: AROM;Both;10 reps;Supine Quad Sets: AAROM;Both;10 reps;Supine Hip ABduction/ADduction: AAROM;Right;5 reps;Supine Straight Leg Raises: AAROM;Right;5 reps;Supine Knee Flexion: PROM;Right;5 reps;Seated Goniometric ROM: patient unable to tolerate ROM to right knee    General Comments        Pertinent Vitals/Pain Pain Assessment: Faces Faces Pain Scale: Hurts whole lot Pain Location: RLE Pain Descriptors / Indicators: Grimacing Pain Intervention(s): Limited activity within patient's tolerance;Monitored during session;Relaxation;Premedicated before session;Repositioned    Home Living                      Prior Function            PT Goals (current goals can now be found in the care plan section) Progress towards PT goals: Progressing toward goals    Frequency  7X/week    PT Plan Current plan remains appropriate    Co-evaluation             End of Session Equipment Utilized During Treatment: Gait belt;Right knee immobilizer Activity Tolerance: Patient limited by pain Patient left: in chair;with call bell/phone within reach;with family/visitor present     Time: 4098-1191  PT Time Calculation (min) (ACUTE ONLY): 38 min  Charges:  $Gait Training: 23-37 mins $Therapeutic Exercise: 8-22 mins                    G Codes:      Olivia CanterMoton, Kem Parcher M 04/01/2014, 3:09 PM  04/01/2014 Corlis HoveMargie Skylar Flynt, PT [phone]

## 2014-04-01 NOTE — Progress Notes (Signed)
Subjective: 2 Days Post-Op Procedure(s) (LRB): RIGHT TOTAL KNEE ARTHROPLASTY (Right) Patient reports pain as mild to right knee.  Tolerating  Po's. Progressing with PT. Denies SOb, CP, or calf pain. No N/V.  Objective: Vital signs in last 24 hours: Temp:  [97.9 F (36.6 C)-101.1 F (38.4 C)] 100 F (37.8 C) (03/20 0500) Pulse Rate:  [97-102] 99 (03/20 0500) Resp:  [16-20] 20 (03/20 0500) BP: (146-175)/(73-75) 148/74 mmHg (03/20 0500) SpO2:  [92 %-98 %] 92 % (03/20 0500)  Intake/Output from previous day: 03/19 0701 - 03/20 0700 In: 912.5 [P.O.:180; I.V.:732.5] Out: 350 [Urine:350] Intake/Output this shift:     Recent Labs  03/31/14 0531 04/01/14 0510  HGB 13.7 12.0    Recent Labs  03/31/14 0531 04/01/14 0510  WBC 13.8* 11.8*  RBC 4.32 3.82*  HCT 40.7 36.4  PLT 261 190    Recent Labs  03/31/14 0531  NA 134*  K 4.0  CL 102  CO2 20  BUN 5*  CREATININE 0.83  GLUCOSE 193*  CALCIUM 8.7    Recent Labs  03/31/14 0531 04/01/14 0510  INR 1.12 1.63*    Well nourished. Alert and oriented x3. RRR, Lungs clear, BS x4. Abdomen soft and non tender. Right Calf soft and non tender. Right knee dressing Changed. No DVT signs. Compartment soft. No signs of infection.  Right LE neurovascular intact.  Assessment/Plan: 2 Days Post-Op Procedure(s) (LRB): RIGHT TOTAL KNEE ARTHROPLASTY (Right) Up with PT Dressing changed Continue current care Plan D/c home tomorrow INR 1.63 on coumadin Pharmacy following  Cough: Add mucinex Monitor  Constipation: Add Colace Add Miralax Monitor Tolerating PO's   Meghan Alvarez L 04/01/2014, 10:21 AM

## 2014-04-01 NOTE — Progress Notes (Signed)
ANTICOAGULATION CONSULT NOTE  Pharmacy Consult for Coumadin Indication: VTE prophylaxis s/p R TKA   Allergies  Allergen Reactions  . Azithromycin Nausea And Vomiting  . Codeine Nausea And Vomiting    Patient Measurements: Height: 5' 5.5" (166.4 cm) Weight: 183 lb (83.008 kg) IBW/kg (Calculated) : 58.15  Vital Signs: Temp: 100 F (37.8 C) (03/20 0500) Temp Source: Oral (03/20 0500) BP: 148/74 mmHg (03/20 0500) Pulse Rate: 99 (03/20 0500)  Labs:  From 03/19/14 Hgb 14.6, PLTC 265, INR 1.01   Estimated Creatinine Clearance: 61 mL/min (by C-G formula based on Cr of 0.83).   Assessment: 80 yo F s/p R TKA on 3/18/1, 6 on Coumadin for post-op VTE prophylaxis. Baseline INR 1.01.  Coumadin predictor points = 2.  INR 1.12 > 1.63  Goal of Therapy:  INR 2-3 Monitor platelets by anticoagulation protocol: Yes   Plan:  Coumadin 2.5mg  PO x 1 tonight. Daily INR.  Bayard HuggerMei Gifford Ballon, PharmD, BCPS  Clinical Pharmacist  Pager: [phone]  04/01/2014 10:53 AM

## 2014-04-01 NOTE — Clinical Social Work Note (Signed)
CSW met with patient and provided bed offers. Patient accepts bed offer with [facility]. CSW contacted facility and made them aware. CSW to follow tomorrow.  [place], [place] Weekend Clinical Social Worker [phone]

## 2014-04-02 ENCOUNTER — Encounter (HOSPITAL_COMMUNITY): Payer: Self-pay | Admitting: General Practice

## 2014-04-02 LAB — PROTIME-INR
INR: 2.69 — ABNORMAL HIGH (ref 0.00–1.49)
Prothrombin Time: 28.8 seconds — ABNORMAL HIGH (ref 11.6–15.2)

## 2014-04-02 LAB — CBC
HEMATOCRIT: 34.2 % — AB (ref 36.0–46.0)
Hemoglobin: 11.4 g/dL — ABNORMAL LOW (ref 12.0–15.0)
MCH: 32.1 pg (ref 26.0–34.0)
MCHC: 33.3 g/dL (ref 30.0–36.0)
MCV: 96.3 fL (ref 78.0–100.0)
Platelets: 182 10*3/uL (ref 150–400)
RBC: 3.55 MIL/uL — AB (ref 3.87–5.11)
RDW: 13.3 % (ref 11.5–15.5)
WBC: 9.6 10*3/uL (ref 4.0–10.5)

## 2014-04-02 NOTE — Progress Notes (Signed)
Utilization review completed.  

## 2014-04-02 NOTE — Discharge Summary (Signed)
Physician Discharge Summary   Patient ID: ARIAN MCQUITTY MRN: [id number] DOB/AGE: 1934-08-20 80 y.o.  Admit date: 03/30/2014 Discharge date: 04/02/2014  Admission Diagnoses:  Active Problems:   S/P TKR (total knee replacement) using cement   Discharge Diagnoses:  Same   Surgeries: Procedure(s): RIGHT TOTAL KNEE ARTHROPLASTY on 03/30/2014   Consultants: Pt/OT  Discharged Condition: Stable  Hospital Course: ANESHIA JACQUET is an 80 y.o. female who was admitted 03/30/2014 with a chief complaint of No chief complaint on file. , and found to have a diagnosis of <principal problem not specified>.  They were brought to the operating room on 03/30/2014 and underwent the above named procedures.    The patient had an uncomplicated hospital course and was stable for discharge.  Recent vital signs:  Filed Vitals:   04/02/14 0732  BP: 126/64  Pulse: 85  Temp: 98.5 F (36.9 C)  Resp: 16    Recent laboratory studies:  Results for orders placed or performed during the hospital encounter of 03/30/14  Protime-INR  Result Value Ref Range   Prothrombin Time 14.6 11.6 - 15.2 seconds   INR 1.12 0.00 - 1.49  CBC  Result Value Ref Range   WBC 13.8 (H) 4.0 - 10.5 K/uL   RBC 4.32 3.87 - 5.11 MIL/uL   Hemoglobin 13.7 12.0 - 15.0 g/dL   HCT 21.3 08.6 - 57.8 %   MCV 94.2 78.0 - 100.0 fL   MCH 31.7 26.0 - 34.0 pg   MCHC 33.7 30.0 - 36.0 g/dL   RDW 46.9 62.9 - 52.8 %   Platelets 261 150 - 400 K/uL  Basic metabolic panel  Result Value Ref Range   Sodium 134 (L) 135 - 145 mmol/L   Potassium 4.0 3.5 - 5.1 mmol/L   Chloride 102 96 - 112 mmol/L   CO2 20 19 - 32 mmol/L   Glucose, Bld 193 (H) 70 - 99 mg/dL   BUN 5 (L) 6 - 23 mg/dL   Creatinine, Ser 4.13 0.50 - 1.10 mg/dL   Calcium 8.7 8.4 - 24.4 mg/dL   GFR calc non Af Amer 66 (L) >90 mL/min   GFR calc Af Amer 77 (L) >90 mL/min   Anion gap 12 5 - 15  Protime-INR  Result Value Ref Range   Prothrombin Time 19.5 (H) 11.6 - 15.2 seconds   INR 1.63 (H) 0.00 - 1.49  CBC  Result Value Ref Range   WBC 11.8 (H) 4.0 - 10.5 K/uL   RBC 3.82 (L) 3.87 - 5.11 MIL/uL   Hemoglobin 12.0 12.0 - 15.0 g/dL   HCT 01.0 27.2 - 53.6 %   MCV 95.3 78.0 - 100.0 fL   MCH 31.4 26.0 - 34.0 pg   MCHC 33.0 30.0 - 36.0 g/dL   RDW 64.4 03.4 - 74.2 %   Platelets 190 150 - 400 K/uL  Protime-INR  Result Value Ref Range   Prothrombin Time 28.8 (H) 11.6 - 15.2 seconds   INR 2.69 (H) 0.00 - 1.49  CBC  Result Value Ref Range   WBC 9.6 4.0 - 10.5 K/uL   RBC 3.55 (L) 3.87 - 5.11 MIL/uL   Hemoglobin 11.4 (L) 12.0 - 15.0 g/dL   HCT 59.5 (L) 63.8 - 75.6 %   MCV 96.3 78.0 - 100.0 fL   MCH 32.1 26.0 - 34.0 pg   MCHC 33.3 30.0 - 36.0 g/dL   RDW 43.3 29.5 - 18.8 %   Platelets 182 150 - 400 K/uL  Discharge Medications:     Medication List    TAKE these medications        alendronate 70 MG tablet  Commonly known as:  FOSAMAX  Take 70 mg by mouth once a week. On Sundays     Fish Oil 1000 MG Caps  Take 1,000 mg by mouth at bedtime.     methocarbamol 750 MG tablet  Commonly known as:  ROBAXIN  Take 750 mg by mouth 2 (two) times daily as needed for muscle spasms.     methocarbamol 500 MG tablet  Commonly known as:  ROBAXIN  Take 1 tablet (500 mg total) by mouth 3 (three) times daily as needed.     Milnacipran 50 MG Tabs tablet  Commonly known as:  SAVELLA  Take 50 mg by mouth 2 (two) times daily.     multivitamin with minerals Tabs tablet  Take 1 tablet by mouth daily. Centrum     oxyCODONE-acetaminophen 5-325 MG per tablet  Commonly known as:  ROXICET  Take 1-2 tablets by mouth every 4 (four) hours as needed for severe pain.     traMADol 50 MG tablet  Commonly known as:  ULTRAM  Take by mouth every 6 (six) hours as needed.     vitamin B-12 1000 MCG tablet  Commonly known as:  CYANOCOBALAMIN  Take 1,000 mcg by mouth daily.     Vitamin D (Ergocalciferol) 50000 UNITS Caps capsule  Commonly known as:  DRISDOL  Take 50,000 Units by  mouth once a week. On Sundays        Diagnostic Studies: No results found.  Disposition:         Follow-up Information    Follow up with NORRIS,STEVEN R, MD. Call in 2 weeks.   Specialty:  Orthopedic Surgery   Why:  [phone]   Contact information:   [address] [place] [address] [phone]        Signed: Thea GistDIXON,Zeshan Sena B 04/02/2014, 9:48 AM

## 2014-04-02 NOTE — Progress Notes (Signed)
   Subjective: 3 Days Post-Op Procedure(s) (LRB): RIGHT TOTAL KNEE ARTHROPLASTY (Right)  Pt doing fair today Still c/o moderate pain to right knee and nervous about flexion and weight bearing Plan for snf placement today Patient reports pain as moderate.  Objective:   VITALS:   Filed Vitals:   04/02/14 0732  BP: 126/64  Pulse: 85  Temp: 98.5 F (36.9 C)  Resp: 16    Right knee incision healing well nv intact distally No rashes or edema  LABS  Recent Labs  03/31/14 0531 04/01/14 0510 04/02/14 0725  HGB 13.7 12.0 11.4*  HCT 40.7 36.4 34.2*  WBC 13.8* 11.8* 9.6  PLT 261 190 182     Recent Labs  03/31/14 0531  NA 134*  K 4.0  BUN 5*  CREATININE 0.83  GLUCOSE 193*     Assessment/Plan: 3 Days Post-Op Procedure(s) (LRB): RIGHT TOTAL KNEE ARTHROPLASTY (Right) D/c to snf today F/u in 2 weeks Pt in agreement     General MillsBrad Dania Marsan, MPAS, PA-C  04/02/2014, 9:45 AM

## 2014-04-02 NOTE — Progress Notes (Signed)
Report called to [facility] 

## 2014-04-02 NOTE — Care Management (Signed)
CARE MANAGEMENT NOTE 04/02/2014  Patient:  [name]   Account Number:  [id number]  Date Initiated:  03/31/2014  Documentation initiated by:  Georgianne FickADEKUNLE,TOLA  Subjective/Objective Assessment:   Right Knee Replacement     Action/Plan:   Patient is still undergoing PT eval.  [facility]   Anticipated DC Date:  04/02/2014   Anticipated DC Plan:  SKILLED NURSING FACILITY  In-house referral  Clinical Social Worker      DC Planning Services  CM consult      Choice offered to / List presented to:             Status of service:   Medicare Important Message given?  YES (If response is "NO", the following Medicare IM given date fields will be blank) Date Medicare IM given:  04/02/2014 Medicare IM given by:  Vance PeperBRADY,Cono Gebhard Date Additional Medicare IM given:   Additional Medicare IM given by:    Discharge Disposition:  SKILLED NURSING FACILITY

## 2014-04-02 NOTE — Discharge Planning (Signed)
[facility]. Patient and patient's husband updated regarding discharge.  [facility] Report number: [phone] Transportation: EMS (PTAR)  Marcelline Deistmily Shaya Reddick, LCSWA [phone]) Licensed Clinical Social Worker Orthopedics [phone]([id number]) and Surgical [phone]([id number])

## 2014-04-03 ENCOUNTER — Encounter: Payer: Self-pay | Admitting: Adult Health

## 2014-04-03 ENCOUNTER — Non-Acute Institutional Stay (SKILLED_NURSING_FACILITY): Payer: Medicare Other | Admitting: Adult Health

## 2014-04-03 DIAGNOSIS — K5900 Constipation, unspecified: Secondary | ICD-10-CM

## 2014-04-03 DIAGNOSIS — Z7901 Long term (current) use of anticoagulants: Secondary | ICD-10-CM | POA: Diagnosis not present

## 2014-04-03 DIAGNOSIS — M797 Fibromyalgia: Secondary | ICD-10-CM

## 2014-04-03 DIAGNOSIS — M810 Age-related osteoporosis without current pathological fracture: Secondary | ICD-10-CM

## 2014-04-03 DIAGNOSIS — M1711 Unilateral primary osteoarthritis, right knee: Secondary | ICD-10-CM | POA: Diagnosis not present

## 2014-04-03 DIAGNOSIS — D62 Acute posthemorrhagic anemia: Secondary | ICD-10-CM | POA: Diagnosis not present

## 2014-04-03 NOTE — Progress Notes (Signed)
Patient ID: Meghan Alvarez, female   DOB: 01/22/1934, 80 y.o.   MRN: [id number]   04/03/2014  Facility:  Nursing Home [facility] Room Number: [id number] LEVEL OF CARE:  SNF (31)   Chief Complaint  Patient presents with  . Hospitalization Follow-up    Osteoarthritis S/P right total knee arthroplasty, osteoporosis, fibromyalgia, constipation and long-term use of anticoagulant    HISTORY OF PRESENT ILLNESS:  This is a 80 year old female who [facility] with osteoarthritis S/P right total knee arthroplasty. She has PMH of fibromyalgia and arthritis. She has been admitted for a short-term rehabilitation.  PAST MEDICAL HISTORY:  Past Medical History  Diagnosis Date  . Complication of anesthesia   . PONV (postoperative nausea and vomiting)   . Fibromyalgia   . Arthritis     CURRENT MEDICATIONS: Reviewed per MAR/see medication list  Allergies  Allergen Reactions  . Azithromycin Nausea And Vomiting  . Codeine Nausea And Vomiting     REVIEW OF SYSTEMS:  GENERAL: no change in appetite, no fatigue, no weight changes, no fever, chills or weakness RESPIRATORY: no cough, SOB, DOE, wheezing, hemoptysis CARDIAC: no chest pain, edema or palpitations GI: no abdominal pain, diarrhea, heart burn, nausea or vomiting, +constipation  PHYSICAL EXAMINATION  GENERAL: no acute distress, normal body habitus SKIN:  Right knee surgical wound covered with dry dressing, no erythema EYES: conjunctivae normal, sclerae normal, normal eye lids NECK: supple, trachea midline, no neck masses, no thyroid tenderness, no thyromegaly LYMPHATICS: no LAN in the neck, no supraclavicular LAN RESPIRATORY: breathing is even & unlabored, BS CTAB CARDIAC: RRR, no murmur,no extra heart sounds, no edema GI: abdomen soft, normal BS, no masses, no tenderness, no hepatomegaly, no splenomegaly EXTREMITIES:  Able to move 4  extremities PSYCHIATRIC: the patient is alert & oriented to person, affect & behavior appropriate  LABS/RADIOLOGY: Labs reviewed: Basic Metabolic Panel:  Recent Labs  86/57/8401/07/27 1452 03/31/14 0531  NA 140 134*  K 4.4 4.0  CL 107 102  CO2 25 20  GLUCOSE 98 193*  BUN 9 5*  CREATININE 0.97 0.83  CALCIUM 10.4 8.7   CBC:  Recent Labs  03/31/14 0531 04/01/14 0510 04/02/14 0725  WBC 13.8* 11.8* 9.6  HGB 13.7 12.0 11.4*  HCT 40.7 36.4 34.2*  MCV 94.2 95.3 96.3  PLT 261 190 182    ASSESSMENT/PLAN:  Osteoarthritis S/P right total knee arthroplasty - for rehabilitation; follow-up with Dr. Ranell PatrickNorris, orthosurgeon; continue Robaxin 500 mg 1 tab by mouth 3 times a day when necessary for muscle spasm; Percocet 5/325 mg 1-2 tabs by mouth every 4 hours when necessary for pain and discontinue Ultram - patient reported having hallucination from ultram; and Coumadin for DVT prophylaxis Osteoporosis - continue Fosamax Fibromyalgia - continue Savella 50 mg by mouth twice a day Long-term use of anticoagulant - INR 2.4 - therapeutic; continue Coumadin 5 mg by mouth daily and check INR on 04/06/14 Constipation - start sestamibi tabs by mouth daily at bedtime and MiraLAX 17 g +4-6 ounces liquid by mouth daily Anemia, acute blood loss - hgb 11.4; will monitor   Goals of care:  Short-term rehabilitation   Labs/test ordered:  CBC CMP    Spent 50 minutes in patient care.   [facility] CenterMEDINA-VARGAS,Ingrid Shifrin, NP [facility] [phone]

## 2014-04-05 ENCOUNTER — Non-Acute Institutional Stay (SKILLED_NURSING_FACILITY): Payer: Medicare Other | Admitting: Internal Medicine

## 2014-04-05 DIAGNOSIS — M810 Age-related osteoporosis without current pathological fracture: Secondary | ICD-10-CM

## 2014-04-05 DIAGNOSIS — M1711 Unilateral primary osteoarthritis, right knee: Secondary | ICD-10-CM

## 2014-04-05 DIAGNOSIS — K5901 Slow transit constipation: Secondary | ICD-10-CM

## 2014-04-05 DIAGNOSIS — D62 Acute posthemorrhagic anemia: Secondary | ICD-10-CM

## 2014-04-05 DIAGNOSIS — M797 Fibromyalgia: Secondary | ICD-10-CM

## 2014-04-05 DIAGNOSIS — R112 Nausea with vomiting, unspecified: Secondary | ICD-10-CM

## 2014-04-05 NOTE — Progress Notes (Signed)
Patient ID: Meghan Alvarez, female   DOB: 05/11/1934, 80 y.o.   MRN: [id number]     [facility]   PCP: Mickie Hillier, MD  Code Status: full code  Allergies  Allergen Reactions  . Azithromycin Nausea And Vomiting  . Codeine Nausea And Vomiting    Chief Complaint  Patient presents with  . New Admit To SNF     HPI:  80 year old patient is here for short term rehabilitation post hospital admission from 03/30/14-04/02/14 with right knee OA. She underwent right total knee arthroplasty. She has PMH of fibromyalgia and arthritis.  She is seen in her room today. She is not feeling well. She complaints of nausea that develops with her pain medications. Her pain is not under control. She has not had a bowel movement since surgery. Her pain is not letting her sleep/ rest. Denies abdominal pain but has vomited yesterday. Appetite is ok.   Review of Systems:  Constitutional: Negative for fever, chills, diaphoresis.  HENT: Negative for headache, congestion Eyes: Negative for eye pain, blurred vision, double vision and discharge.  Respiratory: Negative for cough, shortness of breath and wheezing.   Cardiovascular: Negative for chest pain, palpitations, leg swelling.  Gastrointestinal: Negative for heartburn,abdominal pain Genitourinary: Negative for dysuria Musculoskeletal: Negative for back pain, falls Skin: Negative for itching, rash.  Neurological: Negative for weakness. Has occassional dizziness Psychiatric/Behavioral: Negative for depression   Past Medical History  Diagnosis Date  . Complication of anesthesia   . PONV (postoperative nausea and vomiting)   . Fibromyalgia   . Arthritis    Past Surgical History  Procedure Laterality Date  . Tonsillectomy    . Shoulder arthroscopy Right 2001  . Cervical discectomy  91  . Appendectomy  04    ruptured   . Dilation and curettage of uterus  05  . Fracture surgery Left 06    arm  . Shoulder  arthroscopy Left 07  . Joint replacement Left 08    knee repl  . Total knee arthroplasty Right 03/30/2014    DR NORRIS  . Total knee arthroplasty Right 03/30/2014    Procedure: RIGHT TOTAL KNEE ARTHROPLASTY;  Surgeon: Beverely Low, MD;  [facility];  Service: Orthopedics;  Laterality: Right;   Social History:   reports that she quit smoking about 40 years ago. Her smoking use included Cigarettes. She has a .5 pack-year smoking history. She has never used smokeless tobacco. She reports that she drinks alcohol. She reports that she does not use illicit drugs.  No family history on file.  Medications: Patient's Medications  New Prescriptions   No medications on file  Previous Medications   ALENDRONATE (FOSAMAX) 70 MG TABLET    Take 70 mg by mouth once a week. On Sundays   METHOCARBAMOL (ROBAXIN) 500 MG TABLET    Take 1 tablet (500 mg total) by mouth 3 (three) times daily as needed.   METHOCARBAMOL (ROBAXIN) 750 MG TABLET    Take 750 mg by mouth 2 (two) times daily as needed for muscle spasms.    MILNACIPRAN (SAVELLA) 50 MG TABS TABLET    Take 50 mg by mouth 2 (two) times daily.   MULTIPLE VITAMIN (MULTIVITAMIN WITH MINERALS) TABS TABLET    Take 1 tablet by mouth daily. Centrum   OMEGA-3 FATTY ACIDS (FISH OIL) 1000 MG CAPS    Take 1,000 mg by mouth at bedtime.   OXYCODONE-ACETAMINOPHEN (ROXICET) 5-325 MG PER TABLET    Take  1-2 tablets by mouth every 4 (four) hours as needed for severe pain.   VITAMIN B-12 (CYANOCOBALAMIN) 1000 MCG TABLET    Take 1,000 mcg by mouth daily.   VITAMIN D, ERGOCALCIFEROL, (DRISDOL) 50000 UNITS CAPS CAPSULE    Take 50,000 Units by mouth once a week. On Sundays  Modified Medications   No medications on file  Discontinued Medications   No medications on file     Physical Exam: Filed Vitals:   04/05/14 1007  BP: 149/74  Pulse: 84  Temp: 99.2 F (37.3 C)  Resp: 18  SpO2: 97%    General- elderly female, obese, in no acute distress Head-  normocephalic, atraumatic Throat- moist mucus membrane Neck- no cervical lymphadenopathy Cardiovascular- normal s1,s2, no murmurs, palpable of dorsalis pedis, trace right leg edema Respiratory- bilateral clear to auscultation, no wheeze, no rhonchi, no crackles, no use of accessory muscles Abdomen- bowel sounds present, soft, non tender Musculoskeletal- able to move all 4 extremities, right leg in CPM machine  Neurological- no focal deficit Skin- warm and dry, surgical dressing on right knee clean and dry Psychiatry- alert and oriented to person, place and time, normal mood and affect    Labs reviewed: Basic Metabolic Panel:  Recent Labs  16/10/9601/07/16 1452 03/31/14 0531  NA 140 134*  K 4.4 4.0  CL 107 102  CO2 25 20  GLUCOSE 98 193*  BUN 9 5*  CREATININE 0.97 0.83  CALCIUM 10.4 8.7   CBC:  Recent Labs  03/31/14 0531 04/01/14 0510 04/02/14 0725  WBC 13.8* 11.8* 9.6  HGB 13.7 12.0 11.4*  HCT 40.7 36.4 34.2*  MCV 94.2 95.3 96.3  PLT 261 190 182    Assessment/Plan  Right knee Osteoarthritis  S/P right total knee arthroplasty. Will have her work with physical therapy and occupational therapy team to help with gait training and muscle strengthening exercises.fall precautions. Skin care. Encourage to be out of bed. Has f/u with dr Ranell Patricknorris. Continue robaxin but change it to tid. D/cpercocet. Start oxycodone 10 mg bid with oxyIR 5 mg 1-2 tab q8h prn for pain. Continue coumadin for dvt prophylaxis  Nausea and vomiting No abdominal pain, passing flatus and abdomen is soft on exam without any signs of obstruction. Thus unlikely from SBO. Her pain medication and uncontrolled pain could be contributing some. Add zofran 4 mg tid x 2 days, then q8h prn.   Constipation Continue senna s and miralax, at baseline patient has bowel movement once a week.   Osteoporosis continue weekly Fosamax  Fibromyalgia continue Savella 50 mg bid  Anemia Blood loss post op, monitor h&h   Goals  of care: short term rehabilitation   Labs/tests ordered: cbc, bmp  Family/ staff Communication: reviewed care plan with patient and nursing supervisor    Oneal GroutMAHIMA Demosthenes Virnig, MD  [facility] [phone] (Monday-Friday 8 am - 5 pm) [phone] (afterhours)

## 2014-04-09 ENCOUNTER — Encounter (HOSPITAL_COMMUNITY): Payer: Self-pay | Admitting: Orthopedic Surgery

## 2014-04-10 ENCOUNTER — Other Ambulatory Visit: Payer: Self-pay | Admitting: *Deleted

## 2014-04-10 MED ORDER — OXYCODONE HCL ER 10 MG PO T12A: 10.0000 mg | EXTENDED_RELEASE_TABLET | Freq: Two times a day (BID) | ORAL | Status: DC

## 2014-04-12 ENCOUNTER — Non-Acute Institutional Stay (SKILLED_NURSING_FACILITY): Payer: Medicare Other | Admitting: Adult Health

## 2014-04-12 ENCOUNTER — Encounter: Payer: Self-pay | Admitting: Adult Health

## 2014-04-12 DIAGNOSIS — M810 Age-related osteoporosis without current pathological fracture: Secondary | ICD-10-CM

## 2014-04-12 DIAGNOSIS — M1711 Unilateral primary osteoarthritis, right knee: Secondary | ICD-10-CM | POA: Diagnosis not present

## 2014-04-12 DIAGNOSIS — M797 Fibromyalgia: Secondary | ICD-10-CM | POA: Diagnosis not present

## 2014-04-12 DIAGNOSIS — K5900 Constipation, unspecified: Secondary | ICD-10-CM | POA: Diagnosis not present

## 2014-04-12 DIAGNOSIS — D62 Acute posthemorrhagic anemia: Secondary | ICD-10-CM | POA: Diagnosis not present

## 2014-04-12 NOTE — Progress Notes (Signed)
Patient ID: Meghan Alvarez, female   DOB: 01/24/1934, 80 y.o.   MRN: [id number]   04/12/2014  Facility:  Nursing Home Location:  [facility] Room Number: 605-P LEVEL OF CARE:  SNF (31)   Chief Complaint  Patient presents with  . Discharge Note    Osteoarthritis S/P right total knee arthroplasty, osteoporosis, fibromyalgia and constipation     HISTORY OF PRESENT ILLNESS:  This is a 80 year old female who is for discharge home with home health PT for endurance, OT for ADLs and nurse for Coumadin monitoring.  DME: CPM machine for leg . [facility] with osteoarthritis S/P right total knee arthroplasty. She has PMH of fibromyalgia and arthritis.   Patient was admitted to this facility for short-term rehabilitation after the patient's recent hospitalization.  Patient has completed SNF rehabilitation and therapy has cleared the patient for discharge.  PAST MEDICAL HISTORY:  Past Medical History  Diagnosis Date  . Complication of anesthesia   . PONV (postoperative nausea and vomiting)   . Fibromyalgia   . Arthritis     CURRENT MEDICATIONS: Reviewed per MAR/see medication list  Allergies  Allergen Reactions  . Azithromycin Nausea And Vomiting  . Codeine Nausea And Vomiting     REVIEW OF SYSTEMS:  GENERAL: no change in appetite, no fatigue, no weight changes, no fever, chills or weakness RESPIRATORY: no cough, SOB, DOE, wheezing, hemoptysis CARDIAC: no chest pain, edema or palpitations GI: no abdominal pain, diarrhea, heart burn, nausea or vomiting, +constipation  PHYSICAL EXAMINATION  GENERAL: no acute distress, normal body habitus SKIN:  Right knee surgical wound dry, no erythema NECK: supple, trachea midline, no neck masses, no thyroid tenderness, no thyromegaly LYMPHATICS: no LAN in the neck, no supraclavicular LAN RESPIRATORY: breathing is even & unlabored, BS CTAB CARDIAC: RRR, no  murmur,no extra heart sounds, no edema GI: abdomen soft, normal BS, no masses, no tenderness, no hepatomegaly, no splenomegaly EXTREMITIES:  Able to move 4 extremities PSYCHIATRIC: the patient is alert & oriented to person, affect & behavior appropriate  LABS/RADIOLOGY: 04/04/14  WBC 7.0 hemoglobin 10.6 hematocrit 31.6 MCV 94.0 sodium 139 potassium 4.8 glucose 148 BUN and creatinine 0.74 total bilirubin 0.8 alkaline phosphatase 51 SGOT 21 SGPT 20 total protein 5.5 albumin 3.3 calcium 8.7 Labs reviewed: Basic Metabolic Panel:  Recent Labs  40/98/1101/07/27 1452 03/31/14 0531  NA 140 134*  K 4.4 4.0  CL 107 102  CO2 25 20  GLUCOSE 98 193*  BUN 9 5*  CREATININE 0.97 0.83  CALCIUM 10.4 8.7   CBC:  Recent Labs  03/31/14 0531 04/01/14 0510 04/02/14 0725  WBC 13.8* 11.8* 9.6  HGB 13.7 12.0 11.4*  HCT 40.7 36.4 34.2*  MCV 94.2 95.3 96.3  PLT 261 190 182    ASSESSMENT/PLAN:  Osteoarthritis S/P right total knee arthroplasty - for home health PT, OT and Nurse; follow-up with Dr. Ranell PatrickNorris, orthosurgeon; continue Robaxin 500 mg 1 tab by mouth 3 times a day when necessary for muscle spasm; Oxycodone ER 10 mg 1 tab PO Q BID and OxyIR 5mg  1-2 tabs by mouth every 8 hours when necessary for pain ; and Coumadin for DVT prophylaxis Osteoporosis - continue Fosamax Fibromyalgia - continue Savella 50 mg by mouth twice a day Constipation - continue  Senna-S 2 tabs by mouth daily at bedtime and MiraLAX 17 g +4-6 ounces liquid by mouth daily Anemia, acute blood loss - hgb 10.6;  stable   I have filled out patient's discharge paperwork and written prescriptions.  Patient will receive home health PT, OT and Nurse  DME provided:  CPM machine for leg  Total discharge time: Greater than 30 minutes  Discharge time involved coordination of the discharge process with social worker, nursing staff and therapy department. Medical justification for home health services/DME verified.     [facility],  NP [employer] [phone]

## 2015-07-27 IMAGING — NM NM BONE 3 PHASE
1 series · 12 of 12 positions shown · non-contrast
Comparison: Prior examination 08/26/2007.

RADIOPHARMACEUTICALS:  25.0 mCi Technetium 99 MDP

CLINICAL DATA: Bilateral knee pain. Left total knee arthroplasty in
9006. Left knee pain since surgery with right knee pain for 3 years.

EXAM:
NUCLEAR MEDICINE 3-PHASE BONE SCAN
TECHNIQUE: Radionuclide angiographic images, immediate static blood pool
images, and 3-hour delayed static images were obtained of the knees
after intravenous injection of radiopharmaceutical.

[Series 0: 3 phase bone · 9.33mm/px · 2 acquisitions, 12 frames shown]
[im 1/2  full-range]
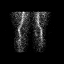
[im 1/2  full-range]
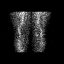
[im 1/2  full-range]
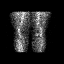
[im 1/2  full-range]
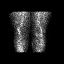
[im 1/2  full-range]
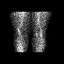
[im 1/2  full-range]
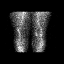
[im 2/2  full-range]
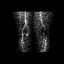
[im 2/2  full-range]
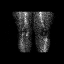
[im 2/2  full-range]
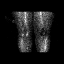
[im 2/2  full-range]
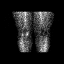
[im 2/2  full-range]
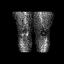
[im 2/2  full-range]
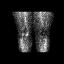

[12 of 12 positions shown; findings below may reference images not displayed]

FINDINGS: Vascular phase: There is normal symmetric perfusion to both distal
thighs and knees.

Blood pool phase: There is medial greater than lateral central right
knee activity which appears osseous/arthropathic. No adjacent soft
tissue blood pool abnormality is identified.

Delayed phase: The blood pool activity in the right knee becomes
slightly more pronounced on the delayed images and is greatest
within the medial compartment. This is progressive compared with the
prior study and likely degenerative, although could represent a
subchondral insufficiency fracture. There is no abnormal activity
surrounding the left total knee arthroplasty.
IMPRESSION: 1. No evidence of loosening or infection of the left total knee
arthroplasty.
2. There is progressive increased delayed phase and blood pool
activity at the right knee, especially medially. These findings are
likely degenerative, although could be secondary to a subchondral
insufficiency fracture. Recommend plain film correlation.

## 2015-10-03 ENCOUNTER — Encounter: Payer: Medicare Other | Attending: Family Medicine

## 2015-10-03 DIAGNOSIS — E119 Type 2 diabetes mellitus without complications: Secondary | ICD-10-CM | POA: Diagnosis not present

## 2015-10-03 DIAGNOSIS — Z713 Dietary counseling and surveillance: Secondary | ICD-10-CM | POA: Insufficient documentation

## 2015-10-03 NOTE — Progress Notes (Signed)

## 2015-10-10 DIAGNOSIS — E119 Type 2 diabetes mellitus without complications: Secondary | ICD-10-CM

## 2015-10-10 DIAGNOSIS — Z713 Dietary counseling and surveillance: Secondary | ICD-10-CM | POA: Diagnosis not present

## 2015-10-10 NOTE — Progress Notes (Signed)

## 2015-10-17 ENCOUNTER — Encounter: Payer: Medicare Other | Attending: Family Medicine

## 2015-10-17 DIAGNOSIS — E119 Type 2 diabetes mellitus without complications: Secondary | ICD-10-CM | POA: Diagnosis not present

## 2015-10-17 DIAGNOSIS — Z713 Dietary counseling and surveillance: Secondary | ICD-10-CM | POA: Diagnosis not present

## 2015-10-17 NOTE — Progress Notes (Signed)
Patient was seen on 10/17/15 for the third of a series of three diabetes self-management courses at [facility].   Meghan Alvarez. State the amount of activity recommended for healthy living . Describe activities suitable for individual needs . Identify ways to regularly incorporate activity into daily life . Identify barriers to activity and ways to over come these barriers  Identify diabetes medications being personally used and their primary action for lowering glucose and possible side effects . Describe role of stress on blood glucose and develop strategies to address psychosocial issues . Identify diabetes complications and ways to prevent them  Explain how to manage diabetes during illness . Evaluate success in meeting personal goal . Establish 2-3 goals that they will plan to diligently work on until they return for the  6059-month follow-up visit  Goals:   I will count my carb choices at most meals and snacks  I will be active 30 minutes or more 5 times a week  I will eat less unhealthy fats by eating less fatty meats  Your patient has identified these potential barriers to change:  none  Your patient has identified their diabetes self-care support plan as  Family Support  Plan:  Attend Support Group as desired

## 2016-01-11 ENCOUNTER — Other Ambulatory Visit: Payer: Self-pay | Admitting: Rheumatology

## 2016-01-14 ENCOUNTER — Telehealth: Payer: Self-pay | Admitting: Rheumatology

## 2016-01-14 NOTE — Telephone Encounter (Signed)
-----   Message from Caffie DammeAmy W Littrell, RT sent at 01/14/2016  9:02 AM EST ----- Needs appt Feb

## 2016-01-14 NOTE — Telephone Encounter (Signed)
Left message for patient to call back to schedule appointment.

## 2016-01-14 NOTE — Telephone Encounter (Signed)
09/05/15 last visit Next visit due Feb message sent to make appt  Ok to refill per Dr Corliss Skainseveshwar

## 2016-02-04 DIAGNOSIS — M7918 Myalgia, other site: Secondary | ICD-10-CM | POA: Insufficient documentation

## 2016-02-04 DIAGNOSIS — M47812 Spondylosis without myelopathy or radiculopathy, cervical region: Secondary | ICD-10-CM | POA: Insufficient documentation

## 2016-02-04 DIAGNOSIS — E559 Vitamin D deficiency, unspecified: Secondary | ICD-10-CM | POA: Insufficient documentation

## 2016-02-04 DIAGNOSIS — M47816 Spondylosis without myelopathy or radiculopathy, lumbar region: Secondary | ICD-10-CM | POA: Insufficient documentation

## 2016-02-04 DIAGNOSIS — M810 Age-related osteoporosis without current pathological fracture: Secondary | ICD-10-CM | POA: Insufficient documentation

## 2016-02-04 NOTE — Progress Notes (Signed)
Office Visit Note  Patient: Meghan Alvarez             Date of Birth: 10/26/1934           MRN: [id number]             PCP: Mickie Hillier, MD Referring: Catha Gosselin, MD Visit Date: 02/05/2016 Occupation: @GUAROCC @    Subjective:  Neck pain   History of Present Illness: Meghan Alvarez is a 82 y.o. female with history of myofascial pain syndrome and disc disease. She states she's been having neck discomfort, bilateral trapezius pain and also some thoracic pain. She does have some discomfort in her bilateral total knee replacement. But no joint swelling.  Activities of Daily Living:  Patient reports morning stiffness for 1 hour.   Patient Reports nocturnal pain.  Difficulty dressing/grooming: Denies Difficulty climbing stairs: Denies Difficulty getting out of chair: Denies Difficulty using hands for taps, buttons, cutlery, and/or writing: Denies   Review of Systems  Constitutional: Positive for fatigue. Negative for night sweats, weight gain, weight loss and weakness.  HENT: Negative for mouth sores, trouble swallowing, trouble swallowing, mouth dryness and nose dryness.   Eyes: Negative for pain, redness, visual disturbance and dryness.  Respiratory: Negative for cough, shortness of breath and difficulty breathing.   Cardiovascular: Negative for chest pain, palpitations, hypertension, irregular heartbeat and swelling in legs/feet.  Gastrointestinal: Positive for constipation. Negative for blood in stool and diarrhea.  Endocrine: Negative for increased urination.  Genitourinary: Negative for vaginal dryness.  Musculoskeletal: Positive for arthralgias, joint pain, myalgias, morning stiffness and myalgias. Negative for joint swelling, muscle weakness and muscle tenderness.  Skin: Negative for color change, rash, hair loss, skin tightness, ulcers and sensitivity to sunlight.  Allergic/Immunologic: Negative for susceptible to infections.  Neurological: Negative for  dizziness, memory loss and night sweats.  Hematological: Negative for swollen glands.  Psychiatric/Behavioral: Negative for depressed mood and sleep disturbance. The patient is not nervous/anxious.     PMFS History:  Patient Active Problem List   Diagnosis Date Noted  . Myofascial pain syndrome 02/04/2016  . Spondylosis of lumbar region without myelopathy or radiculopathy 02/04/2016  . DJD (degenerative joint disease), cervical 02/04/2016  . Age-related osteoporosis without current pathological fracture 02/04/2016  . Vitamin D deficiency 02/04/2016  . S/P TKR (total knee replacement) using cement 03/30/2014    Past Medical History:  Diagnosis Date  . Arthritis   . Complication of anesthesia   . Diabetes mellitus without complication (HCC)   . Fibromyalgia   . PONV (postoperative nausea and vomiting)     No family history on file. Past Surgical History:  Procedure Laterality Date  . APPENDECTOMY  04   ruptured   . CERVICAL DISCECTOMY  91  . DILATION AND CURETTAGE OF UTERUS  05  . FRACTURE SURGERY Left 06   arm  . JOINT REPLACEMENT Left 08   knee repl  . SHOULDER ARTHROSCOPY Right 2001  . SHOULDER ARTHROSCOPY Left 07  . TONSILLECTOMY    . TOTAL KNEE ARTHROPLASTY Right 03/30/2014   DR NORRIS  . TOTAL KNEE ARTHROPLASTY Right 03/30/2014   Procedure: RIGHT TOTAL KNEE ARTHROPLASTY;  Surgeon: Beverely Low, MD;  Location: [facility] OR;  Service: Orthopedics;  Laterality: Right;   Social History   Social History Narrative  . No narrative on file     Objective: Vital Signs: BP (!) 148/80   Pulse 92   Resp 14   Ht 5\' 6"  (1.676 m)   Wt 176  lb (79.8 kg)   BMI 28.41 kg/m    Physical Exam  Constitutional: She is oriented to person, place, and time. She appears well-developed and well-nourished.  HENT:  Head: Normocephalic and atraumatic.  Eyes: Conjunctivae and EOM are normal.  Neck: Normal range of motion.  Cardiovascular: Normal rate, regular rhythm, normal heart sounds and  intact distal pulses.   Pulmonary/Chest: Effort normal and breath sounds normal.  Abdominal: Soft. Bowel sounds are normal.  Lymphadenopathy:    She has no cervical adenopathy.  Neurological: She is alert and oriented to person, place, and time.  Skin: Skin is warm and dry. Capillary refill takes less than 2 seconds.  Psychiatric: She has a normal mood and affect. Her behavior is normal.  Nursing note and vitals reviewed.    Musculoskeletal Exam: C-spine and lumbar spine discomfort with range of motion. She has bilateral trapezius is spasm. Shoulder joints elbow joints wrist joints are good range of motion with no synovitis hip joints knee joints are good range of motion she has bilateral total knee replacement without any warmth swelling or effusion. Ankle joints MTPs PIPs with good range of motion with no synovitis.  CDAI Exam: No CDAI exam completed.    Investigation: No additional findings.   Imaging: No results found.  Speciality Comments: No specialty comments available.    Procedures:  Trigger Point Inj Date/Time: 02/05/2016 3:05 PM Performed by: Pollyann SavoyEVESHWAR, Nadya Hopwood Authorized by: Pollyann SavoyEVESHWAR, Ciana Simmon   Consent Given by:  Patient Site marked: the procedure site was marked   Timeout: prior to procedure the correct patient, procedure, and site was verified   Indications:  Muscle spasm and pain Total # of Trigger Points:  2 Location: neck   Needle Size:  27 G Approach:  Dorsal Medications #1:  0.5 mL lidocaine 1 % Medications #2:  10 mg triamcinolone acetonide 40 MG/ML Patient tolerance:  Patient tolerated the procedure well with no immediate complications   Allergies: Azithromycin and Codeine   Assessment / Plan:     Visit Diagnoses: Myofascial pain syndrome: She continues to have some generalized pain and discomfort.  Spondylosis of lumbar region without myelopathy or radiculopathy: Lower back pain persist.  DJD (degenerative joint disease), cervical she had  bilateral trapezius is spasm which is causing a lot of discomfort after informed consent was obtained different treatment options were discussed bilateral trapezius area was prepped in sterile fashion as described above which she tolerated well.  Age-related osteoporosis without current pathological fracture: She is on alendronate  Status post total bilateral knee replacement using cement : Doing well.   Orders: Orders Placed This Encounter  Procedures  . Trigger Point Injection   No orders of the defined types were placed in this encounter.   Face-to-face time spent with patient was 30 minutes. 50% of time was spent in counseling and coordination of care.  Follow-Up Instructions: Return in about 6 months (around 08/04/2016) for Fibromyalgia, disc disease.   Pollyann SavoyShaili Shemaiah Round, MD  Note - This record has been created using Animal nutritionistDragon software.  Chart creation errors have been sought, but may not always  have been located. Such creation errors do not reflect on  the standard of medical care.

## 2016-02-05 ENCOUNTER — Encounter: Payer: Self-pay | Admitting: Rheumatology

## 2016-02-05 ENCOUNTER — Ambulatory Visit (INDEPENDENT_AMBULATORY_CARE_PROVIDER_SITE_OTHER): Payer: Medicare Other | Admitting: Rheumatology

## 2016-02-05 VITALS — BP 148/80 | HR 92 | Resp 14 | Ht 66.0 in | Wt 176.0 lb

## 2016-02-05 DIAGNOSIS — M810 Age-related osteoporosis without current pathological fracture: Secondary | ICD-10-CM | POA: Diagnosis not present

## 2016-02-05 DIAGNOSIS — E559 Vitamin D deficiency, unspecified: Secondary | ICD-10-CM | POA: Diagnosis not present

## 2016-02-05 DIAGNOSIS — M5030 Other cervical disc degeneration, unspecified cervical region: Secondary | ICD-10-CM | POA: Diagnosis not present

## 2016-02-05 DIAGNOSIS — M47812 Spondylosis without myelopathy or radiculopathy, cervical region: Secondary | ICD-10-CM

## 2016-02-05 DIAGNOSIS — M791 Myalgia: Secondary | ICD-10-CM

## 2016-02-05 DIAGNOSIS — Z96653 Presence of artificial knee joint, bilateral: Secondary | ICD-10-CM

## 2016-02-05 DIAGNOSIS — M47816 Spondylosis without myelopathy or radiculopathy, lumbar region: Secondary | ICD-10-CM

## 2016-02-05 DIAGNOSIS — M7918 Myalgia, other site: Secondary | ICD-10-CM

## 2016-02-05 MED ORDER — LIDOCAINE HCL 1 % IJ SOLN
0.5000 mL | INTRAMUSCULAR | Status: AC | PRN
  Administered 2016-02-05: .5 mL

## 2016-02-05 MED ORDER — TRIAMCINOLONE ACETONIDE 40 MG/ML IJ SUSP
10.0000 mg | INTRAMUSCULAR | Status: AC | PRN
  Administered 2016-02-05: 10 mg via INTRAMUSCULAR

## 2016-04-15 ENCOUNTER — Other Ambulatory Visit: Payer: Self-pay | Admitting: Rheumatology

## 2016-04-15 NOTE — Telephone Encounter (Signed)
ok 

## 2016-04-15 NOTE — Telephone Encounter (Signed)
Last Visit: 02/05/16 Next Visit due July 2018. Message sent to front to schedule patient.   Okay to refill Robaxin?

## 2016-04-16 ENCOUNTER — Telehealth: Payer: Self-pay | Admitting: Rheumatology

## 2016-04-16 NOTE — Telephone Encounter (Signed)
-----   Message from Henriette Combs, LPN sent at 01/17/1094  1:55 PM EDT ----- Please schedule patient for follow up visit. Patient due for next visit July 2018. Thanks!

## 2016-04-16 NOTE — Telephone Encounter (Signed)
Patient is scheduled for July 12th.

## 2016-07-15 NOTE — Progress Notes (Signed)
Office Visit Note  Patient: Meghan Alvarez             Date of Birth: Jun 01, 1934           MRN: [id number]             PCP: Catha Gosselin, MD Referring: Catha Gosselin, MD Visit Date: 07/20/2016 Occupation: @GUAROCC @    Subjective:  Neck and lower back pain.   History of Present Illness: SADEEN Alvarez is a 82 y.o. female with history of myofascial pain syndrome and disc disease. She states she continues to have some neck and lower back pain. She also describes some thoracic pain and some burning sensation in the thoracic region. Her total knee replacements are doing well.  Activities of Daily Living:  Patient reports morning stiffness for 15 minutes.   Patient Reports nocturnal pain.  Difficulty dressing/grooming: Denies Difficulty climbing stairs: Denies Difficulty getting out of chair: Denies Difficulty using hands for taps, buttons, cutlery, and/or writing: Denies   Review of Systems  Constitutional: Positive for fatigue. Negative for night sweats, weight gain, weight loss and weakness.  HENT: Negative for mouth sores, trouble swallowing, trouble swallowing, mouth dryness and nose dryness.   Eyes: Negative for pain, redness, visual disturbance and dryness.  Respiratory: Negative for cough, shortness of breath and difficulty breathing.   Cardiovascular: Negative for chest pain, palpitations, hypertension, irregular heartbeat and swelling in legs/feet.  Gastrointestinal: Positive for constipation. Negative for blood in stool and diarrhea.  Endocrine: Negative for increased urination.  Genitourinary: Negative for vaginal dryness.  Musculoskeletal: Positive for arthralgias, joint pain, myalgias and myalgias. Negative for joint swelling, muscle weakness, morning stiffness and muscle tenderness.  Skin: Negative for color change, rash, hair loss, skin tightness, ulcers and sensitivity to sunlight.  Allergic/Immunologic: Negative for susceptible to infections.  Neurological:  Negative for dizziness, memory loss and night sweats.  Hematological: Negative for swollen glands.  Psychiatric/Behavioral: Positive for sleep disturbance. Negative for depressed mood. The patient is not nervous/anxious.     PMFS History:  Patient Active Problem List   Diagnosis Date Noted  . Myofascial pain syndrome 02/04/2016  . Spondylosis of lumbar region without myelopathy or radiculopathy 02/04/2016  . DJD (degenerative joint disease), cervical 02/04/2016  . Age-related osteoporosis without current pathological fracture 02/04/2016  . Vitamin D deficiency 02/04/2016  . S/P TKR (total knee replacement) using cement 03/30/2014    Past Medical History:  Diagnosis Date  . Arthritis   . Complication of anesthesia   . Diabetes mellitus without complication (HCC)   . Fibromyalgia   . PONV (postoperative nausea and vomiting)     No family history on file. Past Surgical History:  Procedure Laterality Date  . APPENDECTOMY  04   ruptured   . CERVICAL DISCECTOMY  91  . DILATION AND CURETTAGE OF UTERUS  05  . FRACTURE SURGERY Left 06   arm  . JOINT REPLACEMENT Left 08   knee repl  . SHOULDER ARTHROSCOPY Right 2001  . SHOULDER ARTHROSCOPY Left 07  . TONSILLECTOMY    . TOTAL KNEE ARTHROPLASTY Right 03/30/2014   DR NORRIS  . TOTAL KNEE ARTHROPLASTY Right 03/30/2014   Procedure: RIGHT TOTAL KNEE ARTHROPLASTY;  Surgeon: Beverely Low, MD;  Location: [facility];  Service: Orthopedics;  Laterality: Right;   Social History   Social History Narrative  . No narrative on file     Objective: Vital Signs: BP 120/64   Pulse 74   Resp 16   Ht 5' 5.5" (  1.664 m)   Wt 170 lb (77.1 kg)   BMI 27.86 kg/m    Physical Exam  Constitutional: She is oriented to person, place, and time. She appears well-developed and well-nourished.  HENT:  Head: Normocephalic and atraumatic.  Eyes: Conjunctivae and EOM are normal.  Neck: Normal range of motion.  Cardiovascular: Normal rate, regular rhythm,  normal heart sounds and intact distal pulses.   Pulmonary/Chest: Effort normal and breath sounds normal.  Abdominal: Soft. Bowel sounds are normal.  Lymphadenopathy:    She has no cervical adenopathy.  Neurological: She is alert and oriented to person, place, and time.  Skin: Skin is warm and dry. Capillary refill takes less than 2 seconds.  Psychiatric: She has a normal mood and affect. Her behavior is normal.  Nursing note and vitals reviewed.    Musculoskeletal Exam: C-spine and thoracic lumbar spine good range of motion. She had bilateral trapezius spasm. Shoulder joints, elbow joints, wrist joints, MCPs with good range of motion. She is some thickening of PIP/DIP joints. But no synovitis was noted. Hip joints with good range of motion. Her bilateral knee joints are replaced without any warmth swelling or effusion. Although joints afford range of motion with no synovitis.  CDAI Exam: No CDAI exam completed.    Investigation: No additional findings.   Imaging: No results found.  Speciality Comments: No specialty comments available.    Procedures:  Trigger Point Inj Date/Time: 07/20/2016 10:56 AM Performed by: Pollyann SavoyEVESHWAR, Osie Amparo Authorized by: Pollyann SavoyEVESHWAR, Nour Scalise   Consent Given by:  Patient Site marked: the procedure site was marked   Timeout: prior to procedure the correct patient, procedure, and site was verified   Indications:  Muscle spasm and pain Total # of Trigger Points:  2 Location: neck   Needle Size:  27 G Approach:  Dorsal Medications #1:  0.5 mL lidocaine 1 %; 10 mg triamcinolone acetonide 40 MG/ML Medications #2:  0.5 mL lidocaine 1 %; 10 mg triamcinolone acetonide 40 MG/ML Patient tolerance:  Patient tolerated the procedure well with no immediate complications   Allergies: Azithromycin and Codeine   Assessment / Plan:     Visit Diagnoses: Myofascial pain syndrome: She continues to have some generalized pain and discomfort. She does take tramadol on when  necessary basis. As she wants to continue tramadol we will check UDS today. An refill tramadol on when necessary basis. She's having some generalized pain and discomfort. She wants to go for physical therapy. I've given her information on [facility]. If she does not have adequate results are its difficult for her to pay for the co-pay then we can refer her to regular physical therapy.  DDD Cervical spine: She is some chronic discomfort. She had bilateral trapezius spasm. Per request bilateral trapezius area were prepped and injected with cortisone in the procedures described above.  DDD lumbar spine: She has some chronic lower back pain. Status post total bilateral knee replacement using cement  Vitamin D deficiency: She is on supplement.  Age-related osteoporosis without current pathological fracture - On Fosamax 70 mg by mouth every week, FU by PCP.    Orders: Orders Placed This Encounter  Procedures  . Trigger Point Injection  . Pain Mgmt, Profile 5 w/Conf, U   No orders of the defined types were placed in this encounter.   Face-to-face time spent with patient was 30 minutes. 50% of time was spent in counseling and coordination of care.  Follow-Up Instructions: Return in about 6 months (around 01/20/2017) for MFPS  DDD OP.   Pollyann Savoy, MD  Note - This record has been created using Animal nutritionist.  Chart creation errors have been sought, but may not always  have been located. Such creation errors do not reflect on  the standard of medical care.

## 2016-07-20 ENCOUNTER — Ambulatory Visit (INDEPENDENT_AMBULATORY_CARE_PROVIDER_SITE_OTHER): Payer: Medicare Other | Admitting: Rheumatology

## 2016-07-20 ENCOUNTER — Encounter: Payer: Self-pay | Admitting: Rheumatology

## 2016-07-20 VITALS — BP 120/64 | HR 74 | Resp 16 | Ht 65.5 in | Wt 170.0 lb

## 2016-07-20 DIAGNOSIS — M5030 Other cervical disc degeneration, unspecified cervical region: Secondary | ICD-10-CM | POA: Diagnosis not present

## 2016-07-20 DIAGNOSIS — M47816 Spondylosis without myelopathy or radiculopathy, lumbar region: Secondary | ICD-10-CM | POA: Diagnosis not present

## 2016-07-20 DIAGNOSIS — Z96653 Presence of artificial knee joint, bilateral: Secondary | ICD-10-CM

## 2016-07-20 DIAGNOSIS — Z5181 Encounter for therapeutic drug level monitoring: Secondary | ICD-10-CM | POA: Diagnosis not present

## 2016-07-20 DIAGNOSIS — M542 Cervicalgia: Secondary | ICD-10-CM | POA: Diagnosis not present

## 2016-07-20 DIAGNOSIS — M7918 Myalgia, other site: Secondary | ICD-10-CM

## 2016-07-20 DIAGNOSIS — E559 Vitamin D deficiency, unspecified: Secondary | ICD-10-CM

## 2016-07-20 DIAGNOSIS — M791 Myalgia: Secondary | ICD-10-CM | POA: Diagnosis not present

## 2016-07-20 DIAGNOSIS — M810 Age-related osteoporosis without current pathological fracture: Secondary | ICD-10-CM

## 2016-07-20 DIAGNOSIS — M47812 Spondylosis without myelopathy or radiculopathy, cervical region: Secondary | ICD-10-CM

## 2016-07-20 MED ORDER — LIDOCAINE HCL 1 % IJ SOLN
0.5000 mL | INTRAMUSCULAR | Status: AC | PRN
  Administered 2016-07-20: .5 mL

## 2016-07-20 MED ORDER — TRIAMCINOLONE ACETONIDE 40 MG/ML IJ SUSP
10.0000 mg | INTRAMUSCULAR | Status: AC | PRN
  Administered 2016-07-20: 10 mg via INTRAMUSCULAR

## 2016-07-23 ENCOUNTER — Ambulatory Visit: Payer: Medicare Other | Admitting: Rheumatology

## 2016-07-24 ENCOUNTER — Other Ambulatory Visit: Payer: Self-pay | Admitting: Rheumatology

## 2016-07-24 NOTE — Telephone Encounter (Signed)
Last Visit: 07/20/16 Next Visit: 01/20/17  Okay to refill per Dr. Corliss Skainseveshwar

## 2016-07-27 LAB — PAIN MGMT, PROFILE 5 W/CONF, U
Alphahydroxyalprazolam: NEGATIVE ng/mL (ref <25)
Alphahydroxymidazolam: NEGATIVE ng/mL (ref <50)
Alphahydroxytriazolam: NEGATIVE ng/mL (ref <50)
Aminoclonazepam: NEGATIVE ng/mL (ref <25)
Amphetamines: NEGATIVE ng/mL (ref <500)
BENZODIAZEPINES: NEGATIVE ng/mL (ref <100)
Barbiturates: NEGATIVE ng/mL (ref <300)
COCAINE METABOLITE: NEGATIVE ng/mL (ref <150)
Creatinine: 74.7 mg/dL (ref >=20.0)
HYDROXYETHYLFLURAZEPAM: NEGATIVE ng/mL (ref <50)
LORAZEPAM: NEGATIVE ng/mL (ref <50)
MARIJUANA METABOLITE: NEGATIVE ng/mL (ref <20)
Methadone Metabolite: NEGATIVE ng/mL (ref <100)
NORDIAZEPAM: NEGATIVE ng/mL (ref <50)
OXYCODONE: NEGATIVE ng/mL (ref <100)
Opiates: NEGATIVE ng/mL (ref <100)
Oxazepam: NEGATIVE ng/mL (ref <50)
Oxidant: NEGATIVE ug/mL (ref <200)
PH: 5.92 (ref 4.5–9.0)
Temazepam: NEGATIVE ng/mL (ref <50)

## 2016-07-27 NOTE — Progress Notes (Signed)
C/w

## 2016-10-22 ENCOUNTER — Other Ambulatory Visit: Payer: Self-pay | Admitting: Rheumatology

## 2016-10-22 NOTE — Telephone Encounter (Signed)
Last Visit: 07/20/16 Next Visit: 01/20/17  Okay to refill per Dr. Corliss Skains

## 2017-01-08 NOTE — Progress Notes (Signed)
Office Visit Note  Patient: Meghan Alvarez             Date of Birth: 05/20/1934           MRN: [id number]             PCP: Catha GosselinLittle, Kevin, MD Referring: Catha GosselinLittle, Kevin, MD Visit Date: 01/20/2017 Occupation: @GUAROCC @    Subjective:  Trapezius muscle tenderness    History of Present Illness: Meghan Alvarez is a 82 y.o. female with history of myofascial pain syndrome, DDD, and osteoporosis.  Patient states she continues to have generalized pain due to her myofascial pain syndrome.  She takes Robaxin.  She states over the past 2 weeks she has had hand pain after scrubbing her floors.  She states her hands have been swelling, especially in the morning.  She states the pain has improved over the past few days and her hands are not swollen right now. She has bilateral knee replacements, which cause her occasional discomfort.  She states her C-spine and lumbar spine have been stable and her pain has not been progressing.   She continues to see her PCP for management of osteoporosis.  She takes Fosamax and vitamin D 2,000 units daily.   She states she has a sore throat and congestion since Saturday.  She has been using OTC products.      Activities of Daily Living:  Patient reports morning stiffness for 1 hour.   Patient Denies nocturnal pain.  Difficulty dressing/grooming: Denies Difficulty climbing stairs: Denies Difficulty getting out of chair: Denies Difficulty using hands for taps, buttons, cutlery, and/or writing: Denies   Review of Systems  Constitutional: Positive for fatigue. Negative for weakness.  HENT: Negative for mouth sores, mouth dryness and nose dryness.   Eyes: Negative for redness and dryness.  Respiratory: Negative for cough, hemoptysis, shortness of breath and difficulty breathing.   Cardiovascular: Negative for chest pain, palpitations, hypertension, irregular heartbeat and swelling in legs/feet.  Gastrointestinal: Positive for constipation. Negative for blood in  stool and diarrhea.  Endocrine: Negative for increased urination.  Genitourinary: Negative for painful urination.  Musculoskeletal: Positive for arthralgias, joint pain, joint swelling and morning stiffness. Negative for myalgias, muscle weakness, muscle tenderness and myalgias.  Skin: Negative for rash, hair loss, nodules/bumps and sensitivity to sunlight.  Neurological: Negative for dizziness, numbness and headaches.  Hematological: Negative for swollen glands.  Psychiatric/Behavioral: Positive for sleep disturbance. Negative for depressed mood. The patient is not nervous/anxious.     PMFS History:  Patient Active Problem List   Diagnosis Date Noted  . Myofascial pain syndrome 02/04/2016  . Spondylosis of lumbar region without myelopathy or radiculopathy 02/04/2016  . DJD (degenerative joint disease), cervical 02/04/2016  . Age-related osteoporosis without current pathological fracture 02/04/2016  . Vitamin D deficiency 02/04/2016  . S/P TKR (total knee replacement) using cement 03/30/2014    Past Medical History:  Diagnosis Date  . Arthritis   . Complication of anesthesia   . Diabetes mellitus without complication (HCC)   . Fibromyalgia   . PONV (postoperative nausea and vomiting)     Family History  Problem Relation Age of Onset  . Cancer Sister        breast  . Diabetes Brother    Past Surgical History:  Procedure Laterality Date  . APPENDECTOMY  04   ruptured   . CERVICAL DISCECTOMY  91  . DILATION AND CURETTAGE OF UTERUS  05  . FRACTURE SURGERY Left 06   arm  .  JOINT REPLACEMENT Left 08   knee repl  . SHOULDER ARTHROSCOPY Right 2001  . SHOULDER ARTHROSCOPY Left 07  . TONSILLECTOMY    . TOTAL KNEE ARTHROPLASTY Right 03/30/2014   DR NORRIS  . TOTAL KNEE ARTHROPLASTY Right 03/30/2014   Procedure: RIGHT TOTAL KNEE ARTHROPLASTY;  Surgeon: Beverely Low, MD;  Location: [facility] OR;  Service: Orthopedics;  Laterality: Right;   Social History   Social History Narrative    . Not on file     Objective: Vital Signs: BP (!) 153/86 (BP Location: Left Arm, Patient Position: Sitting, Cuff Size: Normal)   Pulse 100   Resp 17   Ht 5' 5.5" (1.664 m)   Wt 175 lb (79.4 kg)   BMI 28.68 kg/m    Physical Exam  Constitutional: She is oriented to person, place, and time. She appears well-developed and well-nourished.  HENT:  Head: Normocephalic and atraumatic.  Eyes: Conjunctivae and EOM are normal.  Neck: Normal range of motion.  Cardiovascular: Normal rate, regular rhythm, normal heart sounds and intact distal pulses.  Pulmonary/Chest: Effort normal and breath sounds normal.  Abdominal: Soft. Bowel sounds are normal.  Lymphadenopathy:    She has no cervical adenopathy.  Neurological: She is alert and oriented to person, place, and time.  Skin: Skin is warm and dry. Capillary refill takes less than 2 seconds.  Psychiatric: She has a normal mood and affect. Her behavior is normal.  Nursing note and vitals reviewed.    Musculoskeletal Exam: C-spine limited ROM with lateral rotation to the left.  Discomfort with ROM lumbar spine. Tenderness midline lumbar region. Shoulder joints, elbow joints, wrist joints, MCPs, PIPs, and DIPs good ROM with no synovitis.  Hip joints, knee joints, ankle joints, MTPs, PIPs, and DIPs good ROM with no synovitis.  Trochanteric bursitis bilaterally.      CDAI Exam: No CDAI exam completed.    Investigation: No additional findings. UDS: 07/20/2016 Narc agreement: 07/20/2016  Imaging: No results found.  Speciality Comments: No specialty comments available.    Procedures:  Trigger Point Inj Date/Time: 01/20/2017 3:16 PM Performed by: Pollyann Savoy, MD Authorized by: Pollyann Savoy, MD   Consent Given by:  Patient Site marked: the procedure site was marked   Timeout: prior to procedure the correct patient, procedure, and site was verified   Indications:  Therapeutic Total # of Trigger Points:  2 Location: neck    Needle Size:  27 G Approach:  Dorsal (Trapezius trigger point injections) Medications #1:  10 mg triamcinolone acetonide 40 MG/ML; 1 mL lidocaine 1 % Medications #2:  10 mg triamcinolone acetonide 40 MG/ML; 1 mL lidocaine 1 % Patient tolerance:  Patient tolerated the procedure well with no immediate complications    Allergies: Azithromycin and Codeine   Assessment / Plan:     Visit Diagnoses: Myofascial pain syndrome - She has generalized muscle tenderness.  She has trapezius muscle tenderness and tension.  She requested bilateral cortisone trigger point injections.  She tolerated the procedure well.  She was advised to monitor her blood pressure.  She can continue on Robaxin. tramadol UDS: 7/9/2018Narc agreement: 07/20/2016.  She is going to continue to go to water aerobics.    Trapezius muscle spasm - She had bilateral cortisone trigger point injections. Plan: Trigger Point Inj  Spondylosis of lumbar region without myelopathy or radiculopathy: Chronic pain  Age-related osteoporosis without current pathological fracture - On Fosamax 70 mg by mouth every week and Vitamin D 2,000 units daily.  Her PCP continues to  monitor her osteoporosis.     DDD (degenerative disc disease), cervical: She has limited ROM with lateral rotation to the left.  She has trapezius muscle spasms as well.   DDD (degenerative disc disease), lumbar: Chronic pain, water aerobics helps her pain   History of vitamin D deficiency: She takes vitamin D 2,000 units daily.    Status post total bilateral knee replacement using cement: Her knees cause her occasional discomfort.  No effusion or warmth.     Orders: Orders Placed This Encounter  Procedures  . Trigger Point Inj   No orders of the defined types were placed in this encounter.   Face-to-face time spent with patient was 30 minutes. Greater than 50% of time was spent in counseling and coordination of care.  Follow-Up Instructions: Return in about 6 months  (around 07/20/2017) for MFS, DDD.   Pollyann SavoyShaili Dolores Mcgovern, MD  Note - This record has been created using Animal nutritionistDragon software.  Chart creation errors have been sought, but may not always  have been located. Such creation errors do not reflect on  the standard of medical care.

## 2017-01-20 ENCOUNTER — Ambulatory Visit: Payer: Medicare Other | Admitting: Rheumatology

## 2017-01-20 ENCOUNTER — Encounter: Payer: Self-pay | Admitting: Rheumatology

## 2017-01-20 VITALS — BP 153/86 | HR 100 | Resp 17 | Ht 65.5 in | Wt 175.0 lb

## 2017-01-20 DIAGNOSIS — Z96653 Presence of artificial knee joint, bilateral: Secondary | ICD-10-CM

## 2017-01-20 DIAGNOSIS — M47816 Spondylosis without myelopathy or radiculopathy, lumbar region: Secondary | ICD-10-CM | POA: Diagnosis not present

## 2017-01-20 DIAGNOSIS — Z8639 Personal history of other endocrine, nutritional and metabolic disease: Secondary | ICD-10-CM

## 2017-01-20 DIAGNOSIS — M5030 Other cervical disc degeneration, unspecified cervical region: Secondary | ICD-10-CM | POA: Diagnosis not present

## 2017-01-20 DIAGNOSIS — M7918 Myalgia, other site: Secondary | ICD-10-CM | POA: Diagnosis not present

## 2017-01-20 DIAGNOSIS — M62838 Other muscle spasm: Secondary | ICD-10-CM | POA: Diagnosis not present

## 2017-01-20 DIAGNOSIS — M5136 Other intervertebral disc degeneration, lumbar region: Secondary | ICD-10-CM

## 2017-01-20 DIAGNOSIS — M810 Age-related osteoporosis without current pathological fracture: Secondary | ICD-10-CM | POA: Diagnosis not present

## 2017-01-20 MED ORDER — LIDOCAINE HCL 1 % IJ SOLN
1.0000 mL | INTRAMUSCULAR | Status: AC | PRN
  Administered 2017-01-20: 1 mL

## 2017-01-20 MED ORDER — TRIAMCINOLONE ACETONIDE 40 MG/ML IJ SUSP
10.0000 mg | INTRAMUSCULAR | Status: AC | PRN
  Administered 2017-01-20: 10 mg via INTRAMUSCULAR

## 2017-02-06 ENCOUNTER — Other Ambulatory Visit: Payer: Self-pay | Admitting: Rheumatology

## 2017-02-08 NOTE — Telephone Encounter (Signed)
Last Visit: 01/20/17 Next Visit: 07/29/17  Okay to refill per Dr. Corliss Skainseveshwar

## 2017-06-30 ENCOUNTER — Other Ambulatory Visit: Payer: Self-pay | Admitting: Rheumatology

## 2017-07-01 NOTE — Telephone Encounter (Signed)
Last Visit: 01/20/17 Next Visit: 07/29/17  Okay to refill per Dr. Corliss Skainseveshwar

## 2017-07-16 NOTE — Progress Notes (Signed)
Office Visit Note  Patient: Meghan Alvarez             Date of Birth: [birth year]           MRN: [id number]             PCP: Catha Gosselin, MD Referring: Catha Gosselin, MD Visit Date: 07/29/2017 Occupation: @GUAROCC @    Subjective:  Pain in multiple joints.   History of Present Illness: Meghan Alvarez is a 83 y.o. female history of myofascial pain syndrome and degenerative disc disease.  She states she has been having increased pain in her lower back.  She also has discomfort in her bilateral trapezius area and her bilateral knee joints.  She has been experiencing some puffiness in her hands.  She has generalized pain involving her muscle groups.  Activities of Daily Living:  Patient reports morning stiffness for 20 minutes.   Patient Reports nocturnal pain.  Difficulty dressing/grooming: Denies Difficulty climbing stairs: Denies Difficulty getting out of chair: Denies Difficulty using hands for taps, buttons, cutlery, and/or writing: Denies   Review of Systems  Constitutional: Positive for fatigue. Negative for night sweats, weight gain and weight loss.  HENT: Negative for mouth sores, trouble swallowing, trouble swallowing, mouth dryness and nose dryness.   Eyes: Negative for pain, redness, visual disturbance and dryness.  Respiratory: Negative for cough, shortness of breath and difficulty breathing.   Cardiovascular: Negative for chest pain, palpitations, hypertension, irregular heartbeat and swelling in legs/feet.  Gastrointestinal: Positive for constipation. Negative for abdominal pain, blood in stool and diarrhea.  Endocrine: Negative for increased urination.  Genitourinary: Negative for pelvic pain and vaginal dryness.  Musculoskeletal: Positive for arthralgias, joint pain and morning stiffness. Negative for joint swelling, myalgias, muscle weakness, muscle tenderness and myalgias.  Skin: Negative for color change, rash, hair loss, skin tightness, ulcers and sensitivity  to sunlight.  Allergic/Immunologic: Negative for susceptible to infections.  Neurological: Negative for dizziness, light-headedness, headaches, memory loss, night sweats and weakness.  Hematological: Negative for bruising/bleeding tendency and swollen glands.  Psychiatric/Behavioral: Negative for depressed mood, confusion and sleep disturbance. The patient is not nervous/anxious.     PMFS History:  Patient Active Problem List   Diagnosis Date Noted  . Myofascial pain syndrome 02/04/2016  . Spondylosis of lumbar region without myelopathy or radiculopathy 02/04/2016  . DJD (degenerative joint disease), cervical 02/04/2016  . Age-related osteoporosis without current pathological fracture 02/04/2016  . Vitamin D deficiency 02/04/2016  . S/P TKR (total knee replacement) using cement 03/30/2014    Past Medical History:  Diagnosis Date  . Arthritis   . Complication of anesthesia   . Diabetes mellitus without complication (HCC)   . Fibromyalgia   . PONV (postoperative nausea and vomiting)     Family History  Problem Relation Age of Onset  . Cancer Sister        breast  . Diabetes Brother    Past Surgical History:  Procedure Laterality Date  . APPENDECTOMY  04   ruptured   . CERVICAL DISCECTOMY  91  . DILATION AND CURETTAGE OF UTERUS  05  . FRACTURE SURGERY Left 06   arm  . JOINT REPLACEMENT Left 08   knee repl  . SHOULDER ARTHROSCOPY Right 2001  . SHOULDER ARTHROSCOPY Left 07  . TONSILLECTOMY    . TOTAL KNEE ARTHROPLASTY Right 03/30/2014   DR NORRIS  . TOTAL KNEE ARTHROPLASTY Right 03/30/2014   Procedure: RIGHT TOTAL KNEE ARTHROPLASTY;  Surgeon: Beverely Low, MD;  Location:  [facility] OR;  Service: Orthopedics;  Laterality: Right;   Social History   Social History Narrative  . Not on file     Objective: Vital Signs: BP 129/73 (BP Location: Right Arm, Patient Position: Sitting, Cuff Size: Normal)   Pulse 74   Resp 15   Ht 5' 5.5" (1.664 m)   Wt 179 lb (81.2 kg)   BMI 29.33  kg/m    Physical Exam  Constitutional: She is oriented to person, place, and time. She appears well-developed and well-nourished.  HENT:  Head: Normocephalic and atraumatic.  Eyes: Conjunctivae and EOM are normal.  Neck: Normal range of motion.  Cardiovascular: Normal rate, regular rhythm, normal heart sounds and intact distal pulses.  Pulmonary/Chest: Effort normal and breath sounds normal.  Abdominal: Soft. Bowel sounds are normal.  Lymphadenopathy:    She has no cervical adenopathy.  Neurological: She is alert and oriented to person, place, and time.  Skin: Skin is warm and dry. Capillary refill takes less than 2 seconds.  Psychiatric: She has a normal mood and affect. Her behavior is normal.  Nursing note and vitals reviewed.    Musculoskeletal Exam: C-spine thoracic lumbar spine limited range of motion with discomfort.  She had bilateral trapezius spasm.  Shoulder joints, elbow joints, wrist joints were in good range of motion.  She has some DIP PIP thickening in her hands.  Hip joints were in good range of motion.  She has bilateral total knee replacements which cause some discomfort.  CDAI Exam: No CDAI exam completed.    Investigation: No additional findings.   Imaging: No results found.  Speciality Comments: No specialty comments available.    Procedures:  Trigger Point Inj Date/Time: 07/29/2017 12:26 PM Performed by: Pollyann Savoyeveshwar, Issac Moure, MD Authorized by: Pollyann Savoyeveshwar, Chantee Cerino, MD   Consent Given by:  Patient Site marked: the procedure site was marked   Timeout: prior to procedure the correct patient, procedure, and site was verified   Indications:  Muscle spasm and pain Total # of Trigger Points:  2 Location: neck   Needle Size:  27 G Approach:  Dorsal Medications #1:  0.5 mL lidocaine 1 %; 10 mg triamcinolone acetonide 40 MG/ML Medications #2:  0.5 mL lidocaine 1 %; 10 mg triamcinolone acetonide 40 MG/ML Patient tolerance:  Patient tolerated the procedure  well with no immediate complications   Allergies: Azithromycin and Codeine   Assessment / Plan:     Visit Diagnoses: Myofascial pain syndrome -patient is having a flare with increased generalized pain.  She is on tramadol which she uses only on PRN basis.  Is been also having increased muscle spasms.  She takes Robaxin during the daytime and occasionally she uses cyclobenzaprine at bedtime.  Spondylosis of lumbar region without myelopathy or radiculopathy-she is been having increased pain and discomfort in her lower back with muscle spasm.  She requested a prescription refill for cyclobenzaprine which was given.  A handout on back exercises was given.  She usually gets massage therapy which is helpful.  She has had no benefit with physical therapy in the past.  Trapezius muscle spasm-she is been having increased neck pain.  She requested bilateral trigger point injections which were administered.  The procedure as described above.  Age-related osteoporosis without current pathological fracture - On Fosamax 70 mg by mouth every week and Vitamin D 2,000 units daily.   DDD (degenerative disc disease), cervical-chronic pain  DDD (degenerative disc disease), lumbar-chronic pain  Status post total bilateral knee replacement using cement-she continues  to have some discomfort in her replaced knees.  History of vitamin D deficiency-she is on supplement.  Medication monitoring encounter - Plan: Pain Mgmt, Profile 5 w/Conf, U, Pain Mgmt, Tramadol w/medMATCH, U    Orders: Orders Placed This Encounter  Procedures  . Trigger Point Inj  . Pain Mgmt, Profile 5 w/Conf, U  . Pain Mgmt, Tramadol w/medMATCH, U   Meds ordered this encounter  Medications  . cyclobenzaprine (FLEXERIL) 10 MG tablet    Sig: Take 1 tablet (10 mg total) by mouth at bedtime as needed for muscle spasms.    Dispense:  30 tablet    Refill:  0    Face-to-face time spent with patient was 30 minutes. Greater than 50% of time  was spent in counseling and coordination of care.  Follow-Up Instructions: Return in about 6 months (around 01/29/2018) for MFPS, DDD.   Pollyann Savoy, MD  Note - This record has been created using Animal nutritionist.  Chart creation errors have been sought, but may not always  have been located. Such creation errors do not reflect on  the standard of medical care.

## 2017-07-29 ENCOUNTER — Encounter: Payer: Self-pay | Admitting: Rheumatology

## 2017-07-29 ENCOUNTER — Ambulatory Visit: Payer: Medicare Other | Admitting: Rheumatology

## 2017-07-29 VITALS — BP 129/73 | HR 74 | Resp 15 | Ht 65.5 in | Wt 179.0 lb

## 2017-07-29 DIAGNOSIS — M5136 Other intervertebral disc degeneration, lumbar region: Secondary | ICD-10-CM | POA: Diagnosis not present

## 2017-07-29 DIAGNOSIS — Z96653 Presence of artificial knee joint, bilateral: Secondary | ICD-10-CM

## 2017-07-29 DIAGNOSIS — M810 Age-related osteoporosis without current pathological fracture: Secondary | ICD-10-CM

## 2017-07-29 DIAGNOSIS — M62838 Other muscle spasm: Secondary | ICD-10-CM

## 2017-07-29 DIAGNOSIS — M47816 Spondylosis without myelopathy or radiculopathy, lumbar region: Secondary | ICD-10-CM | POA: Diagnosis not present

## 2017-07-29 DIAGNOSIS — Z5181 Encounter for therapeutic drug level monitoring: Secondary | ICD-10-CM

## 2017-07-29 DIAGNOSIS — M5030 Other cervical disc degeneration, unspecified cervical region: Secondary | ICD-10-CM | POA: Diagnosis not present

## 2017-07-29 DIAGNOSIS — M7918 Myalgia, other site: Secondary | ICD-10-CM

## 2017-07-29 DIAGNOSIS — Z8639 Personal history of other endocrine, nutritional and metabolic disease: Secondary | ICD-10-CM

## 2017-07-29 MED ORDER — LIDOCAINE HCL 1 % IJ SOLN
0.5000 mL | INTRAMUSCULAR | Status: AC | PRN
  Administered 2017-07-29: .5 mL

## 2017-07-29 MED ORDER — TRIAMCINOLONE ACETONIDE 40 MG/ML IJ SUSP
10.0000 mg | INTRAMUSCULAR | Status: AC | PRN
  Administered 2017-07-29: 10 mg via INTRAMUSCULAR

## 2017-07-29 MED ORDER — CYCLOBENZAPRINE HCL 10 MG PO TABS: 10.0000 mg | ORAL_TABLET | Freq: Every evening | ORAL | 0 refills | Status: DC | PRN

## 2017-07-29 NOTE — Patient Instructions (Signed)

## 2017-08-01 LAB — PAIN MGMT, PROFILE 5 W/CONF, U
Amphetamines: NEGATIVE ng/mL (ref <500)
BARBITURATES: NEGATIVE ng/mL (ref <300)
Benzodiazepines: NEGATIVE ng/mL (ref <100)
Cocaine Metabolite: NEGATIVE ng/mL (ref <150)
Creatinine: 149.9 mg/dL
METHADONE METABOLITE: NEGATIVE ng/mL (ref <100)
Marijuana Metabolite: NEGATIVE ng/mL (ref <20)
OXIDANT: NEGATIVE ug/mL (ref <200)
OXYCODONE: NEGATIVE ng/mL (ref <100)
Opiates: NEGATIVE ng/mL (ref <100)
PH: 7.05 (ref 4.5–9.0)

## 2017-08-01 LAB — PAIN MGMT, TRAMADOL W/MEDMATCH, U
Desmethyltramadol: NEGATIVE ng/mL (ref <100)
Tramadol: NEGATIVE ng/mL (ref <100)

## 2017-08-02 NOTE — Progress Notes (Signed)
Patient takes Tramadol prn.

## 2017-08-17 ENCOUNTER — Other Ambulatory Visit: Payer: Self-pay

## 2017-08-17 ENCOUNTER — Emergency Department (HOSPITAL_BASED_OUTPATIENT_CLINIC_OR_DEPARTMENT_OTHER): Payer: Medicare Other

## 2017-08-17 ENCOUNTER — Encounter (HOSPITAL_BASED_OUTPATIENT_CLINIC_OR_DEPARTMENT_OTHER): Payer: Self-pay | Admitting: *Deleted

## 2017-08-17 ENCOUNTER — Emergency Department (HOSPITAL_BASED_OUTPATIENT_CLINIC_OR_DEPARTMENT_OTHER)
Admission: EM | Admit: 2017-08-17 | Discharge: 2017-08-17 | Disposition: A | Discharge status: Home / Self Care | Payer: Medicare Other | Attending: Emergency Medicine | Admitting: Emergency Medicine

## 2017-08-17 DIAGNOSIS — R072 Precordial pain: Secondary | ICD-10-CM | POA: Diagnosis not present

## 2017-08-17 DIAGNOSIS — E119 Type 2 diabetes mellitus without complications: Secondary | ICD-10-CM | POA: Insufficient documentation

## 2017-08-17 DIAGNOSIS — Z96652 Presence of left artificial knee joint: Secondary | ICD-10-CM | POA: Insufficient documentation

## 2017-08-17 DIAGNOSIS — R0789 Other chest pain: Secondary | ICD-10-CM | POA: Diagnosis present

## 2017-08-17 LAB — CBC WITH DIFFERENTIAL/PLATELET
BASOS ABS: 0 10*3/uL (ref 0.0–0.1)
BASOS PCT: 0 %
EOS ABS: 0.1 10*3/uL (ref 0.0–0.7)
EOS PCT: 1 %
HCT: 41.5 % (ref 36.0–46.0)
Hemoglobin: 14 g/dL (ref 12.0–15.0)
Lymphocytes Relative: 34 %
Lymphs Abs: 2.6 10*3/uL (ref 0.7–4.0)
MCH: 31.8 pg (ref 26.0–34.0)
MCHC: 33.7 g/dL (ref 30.0–36.0)
MCV: 94.3 fL (ref 78.0–100.0)
MONO ABS: 0.6 10*3/uL (ref 0.1–1.0)
Monocytes Relative: 8 %
Neutro Abs: 4.4 10*3/uL (ref 1.7–7.7)
Neutrophils Relative %: 57 %
PLATELETS: 209 10*3/uL (ref 150–400)
RBC: 4.4 MIL/uL (ref 3.87–5.11)
RDW: 13.5 % (ref 11.5–15.5)
WBC: 7.8 10*3/uL (ref 4.0–10.5)

## 2017-08-17 LAB — COMPREHENSIVE METABOLIC PANEL
ALT: 33 U/L (ref 0–44)
AST: 33 U/L (ref 15–41)
Albumin: 4.5 g/dL (ref 3.5–5.0)
Alkaline Phosphatase: 62 U/L (ref 38–126)
Anion gap: 8 (ref 5–15)
BUN: 13 mg/dL (ref 8–23)
CHLORIDE: 102 mmol/L (ref 98–111)
CO2: 29 mmol/L (ref 22–32)
CREATININE: 0.99 mg/dL (ref 0.44–1.00)
Calcium: 9.8 mg/dL (ref 8.9–10.3)
GFR calc Af Amer: >60 mL/min (ref >60)
GFR calc non Af Amer: 52 mL/min — ABNORMAL LOW (ref >60)
Glucose, Bld: 90 mg/dL (ref 70–99)
POTASSIUM: 4 mmol/L (ref 3.5–5.1)
SODIUM: 139 mmol/L (ref 135–145)
Total Bilirubin: 0.5 mg/dL (ref 0.3–1.2)
Total Protein: 7.7 g/dL (ref 6.5–8.1)

## 2017-08-17 LAB — TROPONIN I: Troponin I: <0.03 ng/mL (ref <0.03)

## 2017-08-17 MED ORDER — CYCLOBENZAPRINE HCL 10 MG PO TABS
10.0000 mg | ORAL_TABLET | Freq: Once | ORAL | Status: AC
  Administered 2017-08-17: 10 mg via ORAL
  Filled 2017-08-17: qty 1

## 2017-08-17 MED ORDER — TRAMADOL HCL 50 MG PO TABS
50.0000 mg | ORAL_TABLET | Freq: Once | ORAL | Status: AC
  Administered 2017-08-17: 50 mg via ORAL
  Filled 2017-08-17: qty 1

## 2017-08-17 NOTE — Discharge Instructions (Addendum)
You have been seen in the Emergency Department (ED) today for chest pain.  As we have discussed today?s test results are normal, but you may require further testing. If you still have pain in 2-3 days, call cardiology for follow up.   Continue taking your methocarbamol as prescribed by your doctor.    Please follow up with the recommended doctor as instructed above in these documents regarding today?s emergent visit and your recent symptoms to discuss further management.  Continue to take your regular medications. If you are not doing so already, please also take a daily baby aspirin (81 mg), at least until you follow up with your doctor.  Return to the Emergency Department (ED) if you experience any further chest pain/pressure/tightness, difficulty breathing, or sudden sweating, or other symptoms that concern you.   Chest Pain (Nonspecific) It is often hard to give a specific diagnosis for the cause of chest pain. There is always a chance that your pain could be related to something serious, such as a heart attack or a blood clot in the lungs. You need to follow up with your health care provider for further evaluation. CAUSES  Heartburn. Pneumonia or bronchitis. Anxiety or stress. Inflammation around your heart (pericarditis) or lung (pleuritis or pleurisy). A blood clot in the lung. A collapsed lung (pneumothorax). It can develop suddenly on its own (spontaneous pneumothorax) or from trauma to the chest. Shingles infection (herpes zoster virus). The chest wall is composed of bones, muscles, and cartilage. Any of these can be the source of the pain. The bones can be bruised by injury. The muscles or cartilage can be strained by coughing or overwork. The cartilage can be affected by inflammation and become sore (costochondritis). DIAGNOSIS  Lab tests or other studies may be needed to find the cause of your pain. Your health care provider may have you take a test called an ambulatory  electrocardiogram (ECG). An ECG records your heartbeat patterns over a 24-hour period. You may also have other tests, such as: Transthoracic echocardiogram (TTE). During echocardiography, sound waves are used to evaluate how blood flows through your heart. Transesophageal echocardiogram (TEE). Cardiac monitoring. This allows your health care provider to monitor your heart rate and rhythm in real time. Holter monitor. This is a portable device that records your heartbeat and can help diagnose heart arrhythmias. It allows your health care provider to track your heart activity for several days, if needed. Stress tests by exercise or by giving medicine that makes the heart beat faster. TREATMENT  Treatment depends on what may be causing your chest pain. Treatment may include: Acid blockers for heartburn. Anti-inflammatory medicine. Pain medicine for inflammatory conditions. Antibiotics if an infection is present. You may be advised to change lifestyle habits. This includes stopping smoking and avoiding alcohol, caffeine, and chocolate. You may be advised to keep your head raised (elevated) when sleeping. This reduces the chance of acid going backward from your stomach into your esophagus. Most of the time, nonspecific chest pain will improve within 2-3 days with rest and mild pain medicine.  HOME CARE INSTRUCTIONS  If antibiotics were prescribed, take them as directed. Finish them even if you start to feel better. For the next few days, avoid physical activities that bring on chest pain. Continue physical activities as directed. Do not use any tobacco products, including cigarettes, chewing tobacco, or electronic cigarettes. Avoid drinking alcohol. Only take medicine as directed by your health care provider. Follow your health care provider's suggestions for further testing  if your chest pain does not go away. Keep any follow-up appointments you made. If you do not go to an appointment, you could  develop lasting (chronic) problems with pain. If there is any problem keeping an appointment, call to reschedule. SEEK MEDICAL CARE IF:  Your chest pain does not go away, even after treatment. You have a rash with blisters on your chest. You have a fever. SEEK IMMEDIATE MEDICAL CARE IF:  You have increased chest pain or pain that spreads to your arm, neck, jaw, back, or abdomen. You have shortness of breath. You have an increasing cough, or you cough up blood. You have severe back or abdominal pain. You feel nauseous or vomit. You have severe weakness. You faint. You have chills. This is an emergency. Do not wait to see if the pain will go away. Get medical help at once. Call your local emergency services (911 in [place] Do not drive yourself to the hospital. MAKE SURE YOU:  Understand these instructions. Will watch your condition. Will get help right away if you are not doing well or get worse. Document Released: 10/08/2004 Document Revised: 01/03/2013 Document Reviewed: 08/04/2007 [facility] Patient Information 2015 [place], [place]. This information is not intended to replace advice given to you by your health care provider. Make sure you discuss any questions you have with your health care provider.

## 2017-08-17 NOTE — ED Notes (Signed)
Pt verbalizes understanding of d/c instructions and denies any further needs at this time. 

## 2017-08-17 NOTE — ED Provider Notes (Signed)
  Physical Exam  BP (!) 144/73   Pulse 72   Temp 97.8 F (36.6 C) (Oral)   Resp 19   Ht 5\' 5"  (1.651 m)   Wt 81.2 kg (179 lb)   SpO2 100%   BMI 29.79 kg/m   Physical Exam  ED Course/Procedures     Procedures  MDM  Care assumed at 3:30 pm. Patient has chest pain that is reproducible. Low heart score. Sign out pending repeat trop.   5:09 PM Repeat trop normal. BP improved to 144/73 from 180/120 with motrin and flexeril. Pain continues to be reproducible. Has robaxin at home. If she still has pain in several days, recommend cardiology follow up with stress test.    Charlynne PanderYao, Camile Esters Hsienta, MD 08/17/17 1710

## 2017-08-17 NOTE — ED Provider Notes (Signed)
Emergency Department Provider Note   I have reviewed the triage vital signs and the nursing notes.   HISTORY  Chief Complaint Chest Pain   HPI Meghan Alvarez is a 83 y.o. female with PMH of fibromyalgia, DM, and arthritis presents to the ED with intermittent pain. She describes a "bad pain" in the left chest. It has been occurring intermittently over the last 3 months. No exertional or pleuritic quality. Sometimes worse with movement. No fever or chills. The pain is more frequent today and her friends encouraged her to present to the ED for evaluation. No prior CAD or heart cath. No radiation or symptoms or other modifying factors.   Past Medical History:  Diagnosis Date  . Arthritis   . Complication of anesthesia   . Diabetes mellitus without complication (HCC)   . Fibromyalgia   . PONV (postoperative nausea and vomiting)     Patient Active Problem List   Diagnosis Date Noted  . Myofascial pain syndrome 02/04/2016  . Spondylosis of lumbar region without myelopathy or radiculopathy 02/04/2016  . DJD (degenerative joint disease), cervical 02/04/2016  . Age-related osteoporosis without current pathological fracture 02/04/2016  . Vitamin D deficiency 02/04/2016  . S/P TKR (total knee replacement) using cement 03/30/2014    Past Surgical History:  Procedure Laterality Date  . APPENDECTOMY  04   ruptured   . CERVICAL DISCECTOMY  91  . DILATION AND CURETTAGE OF UTERUS  05  . FRACTURE SURGERY Left 06   arm  . JOINT REPLACEMENT Left 08   knee repl  . SHOULDER ARTHROSCOPY Right 2001  . SHOULDER ARTHROSCOPY Left 07  . TONSILLECTOMY    . TOTAL KNEE ARTHROPLASTY Right 03/30/2014   DR NORRIS  . TOTAL KNEE ARTHROPLASTY Right 03/30/2014   Procedure: RIGHT TOTAL KNEE ARTHROPLASTY;  Surgeon: Beverely LowSteve Norris, MD;  [facility];  Service: Orthopedics;  Laterality: Right;    Allergies Azithromycin and Codeine  Family History  Problem Relation Age of Onset  . Cancer Sister          breast  . Diabetes Brother     Social History Social History   Tobacco Use  . Smoking status: Never Smoker  . Smokeless tobacco: Never Used  Substance Use Topics  . Alcohol use: Yes    Comment: occ  . Drug use: Never    Review of Systems  Constitutional: No fever/chills Eyes: No visual changes. ENT: No sore throat. Cardiovascular: Positive chest pain. Respiratory: Denies shortness of breath. Gastrointestinal: No abdominal pain.  No nausea, no vomiting.  No diarrhea.  No constipation. Genitourinary: Negative for dysuria. Musculoskeletal: Negative for back pain. Skin: Negative for rash. Neurological: Negative for headaches, focal weakness or numbness.  10-point ROS otherwise negative.  ____________________________________________   PHYSICAL EXAM:  VITAL SIGNS: ED Triage Vitals  Enc Vitals Group     BP 08/17/17 1316 (!) 184/88     Pulse Rate 08/17/17 1316 79     Resp 08/17/17 1316 16     Temp 08/17/17 1316 97.8 F (36.6 C)     Temp Source 08/17/17 1316 Oral     SpO2 08/17/17 1316 100 %     Weight 08/17/17 1317 179 lb (81.2 kg)     Height 08/17/17 1317 5\' 5"  (1.651 m)     Pain Score 08/17/17 1316 5   Constitutional: Alert and oriented. Well appearing and in no acute distress. Eyes: Conjunctivae are normal.  Head: Atraumatic. Nose: No congestion/rhinnorhea. Mouth/Throat: Mucous membranes are moist.  Neck: No stridor.   Cardiovascular: Normal rate, regular rhythm. Good peripheral circulation. Grossly normal heart sounds. Patient with point tenderness over the left breast that reproduces the pain. No redness or mass.  Respiratory: Normal respiratory effort.  No retractions. Lungs CTAB. Gastrointestinal: Soft and nontender. No distention.  Musculoskeletal: No lower extremity tenderness nor edema. No gross deformities of extremities. Neurologic:  Normal speech and language. No gross focal neurologic deficits are appreciated.  Skin:  Skin is warm, dry and  intact. No rash noted.  ____________________________________________   LABS (all labs ordered are listed, but only abnormal results are displayed)  Labs Reviewed  COMPREHENSIVE METABOLIC PANEL - Abnormal; Notable for the following components:      Result Value   GFR calc non Af Amer 52 (*)    All other components within normal limits  TROPONIN I  CBC WITH DIFFERENTIAL/PLATELET  TROPONIN I   ____________________________________________  EKG   EKG Interpretation  Date/Time:  Tuesday August 17 2017 13:19:41 EDT Ventricular Rate:  69 PR Interval:  144 QRS Duration: 90 QT Interval:  364 QTC Calculation: 390 R Axis:   36 Text Interpretation:  Normal sinus rhythm Abnormal QRS-T angle, consider primary T wave abnormality Abnormal ECG No STEMI.  Confirmed by Alona Bene [phone]) on 08/17/2017 1:22:13 PM       ____________________________________________  RADIOLOGY  Dg Chest 2 View  Result Date: 08/17/2017 CLINICAL DATA:  Intermittent left-sided chest pain for 2 months. EXAM: CHEST - 2 VIEW COMPARISON:  03/22/2014 FINDINGS: The heart size and mediastinal contours are within normal limits. Both lungs are clear. The visualized skeletal structures are unremarkable. IMPRESSION: No active cardiopulmonary disease. Electronically Signed   By: Francene Boyers M.D.   On: 08/17/2017 13:39    ____________________________________________   PROCEDURES  Procedure(s) performed:   Procedures  None ____________________________________________   INITIAL IMPRESSION / ASSESSMENT AND PLAN / ED COURSE  Pertinent labs & imaging results that were available during my care of the patient were reviewed by me and considered in my medical decision making (see chart for details).  Patient presents to the ED for evaluation of chest pain intermittently for the last 3 months. Pain is reproducible with palpation of the left breast. No masses appreciated. Pain is brief but much more frequent today. No  active CP at the time of my evaluation. Labs, CXR, and EKG reviewed without acute findings. Plan for serial troponin and discharge with cardiology follow up in the coming week. Placed ambulatory referral in the ED.  Differential includes all life-threatening causes for chest pain. This includes but is not exclusive to acute coronary syndrome, aortic dissection, pulmonary embolism, cardiac tamponade, community-acquired pneumonia, pericarditis, musculoskeletal chest wall pain, etc.  Care transferred to Dr. Silverio Lay who will follow second troponin.   ____________________________________________  FINAL CLINICAL IMPRESSION(S) / ED DIAGNOSES  Final diagnoses:  Precordial chest pain    MEDICATIONS GIVEN DURING THIS VISIT:  Medications  traMADol (ULTRAM) tablet 50 mg (has no administration in time range)  cyclobenzaprine (FLEXERIL) tablet 10 mg (has no administration in time range)    Note:  This document was prepared using Dragon voice recognition software and may include unintentional dictation errors.  Alona Bene, MD Emergency Medicine    Brieanna Nau, Arlyss Repress, MD 08/17/17 1537

## 2017-08-17 NOTE — ED Triage Notes (Signed)
Pt c/o left sided chest pain x 3 months " on and off" today the pain was longer time  and stronger than before

## 2017-10-04 ENCOUNTER — Other Ambulatory Visit: Payer: Self-pay | Admitting: Rheumatology

## 2017-10-04 NOTE — Telephone Encounter (Signed)
Last Visit: 07/29/17 Next Visit due January 2020. Message sent to front to schedule patient   Okay to refill per Dr. Corliss Skainseveshwar

## 2018-01-17 ENCOUNTER — Other Ambulatory Visit: Payer: Self-pay | Admitting: Rheumatology

## 2018-01-17 NOTE — Telephone Encounter (Signed)
Last Visit: 07/29/17 Next Visit: 02/03/18  Okay to refill per Dr. Corliss Skains

## 2018-01-24 NOTE — Progress Notes (Signed)
Office Visit Note  Patient: Meghan Alvarez             Date of Birth: 08/14/1934           MRN: [id number]             PCP: Catha Gosselin, MD Referring: Catha Gosselin, MD Visit Date: 02/03/2018 Occupation: @GUAROCC @  Subjective:  Pain in both hands   History of Present Illness: Meghan Alvarez is a 84 y.o. female with history of myofascial pain syndrome and DDD.  She takes Robaxin 750 mg twice daily PRN for muscle spasms and Flexeril 10 mg by mouth at bedtime.  She continues have generalized muscle aches and muscle tenderness.  She has trapezius muscle spasms bilaterally worse on the right side.  She will begin trigger point injection on the right side today.  Patient presents today with bilateral hand pain.  She states that she has noticed intermittent joint swelling.  She states that the pain and swelling is progressively been getting worse since December 2019.  She denies any wrist pain or swelling at this time.  She states that she very rarely takes tramadol for pain relief.  She reports that she continues to have chronic pain in bilateral knee joints that have been replaced.  She denies any joint swelling.  She reports she continues take Fosamax 70 mg by mouth once weekly for management of osteoporosis.  She continues to have chronic neck and lower back pain.   Activities of Daily Living:  Patient reports morning stiffness for 45  minutes.   Patient Denies nocturnal pain.  Difficulty dressing/grooming: Denies Difficulty climbing stairs: Denies Difficulty getting out of chair: Denies Difficulty using hands for taps, buttons, cutlery, and/or writing: Denies  Review of Systems  Constitutional: Positive for fatigue.  HENT: Negative for mouth sores, mouth dryness and nose dryness.   Eyes: Negative for pain, visual disturbance and dryness.  Respiratory: Negative for cough, hemoptysis, shortness of breath and difficulty breathing.   Cardiovascular: Negative for chest pain,  palpitations, hypertension and swelling in legs/feet.  Gastrointestinal: Positive for constipation. Negative for blood in stool and diarrhea.  Endocrine: Negative for increased urination.  Genitourinary: Negative for painful urination.  Musculoskeletal: Positive for arthralgias, joint pain, joint swelling, myalgias, morning stiffness, muscle tenderness and myalgias. Negative for muscle weakness.  Skin: Negative for color change, pallor, rash, hair loss, nodules/bumps, skin tightness, ulcers and sensitivity to sunlight.  Allergic/Immunologic: Negative for susceptible to infections.  Neurological: Negative for dizziness, numbness, headaches and weakness.  Hematological: Negative for swollen glands.  Psychiatric/Behavioral: Negative for depressed mood and sleep disturbance. The patient is not nervous/anxious.     PMFS History:  Patient Active Problem List   Diagnosis Date Noted  . Myofascial pain syndrome 02/04/2016  . Spondylosis of lumbar region without myelopathy or radiculopathy 02/04/2016  . DJD (degenerative joint disease), cervical 02/04/2016  . Age-related osteoporosis without current pathological fracture 02/04/2016  . Vitamin D deficiency 02/04/2016  . S/P TKR (total knee replacement) using cement 03/30/2014    Past Medical History:  Diagnosis Date  . Arthritis   . Complication of anesthesia   . Diabetes mellitus without complication (HCC)   . Fibromyalgia   . PONV (postoperative nausea and vomiting)     Family History  Problem Relation Age of Onset  . Cancer Sister        breast  . Diabetes Brother    Past Surgical History:  Procedure Laterality Date  . APPENDECTOMY  04  ruptured   . CERVICAL DISCECTOMY  91  . DILATION AND CURETTAGE OF UTERUS  05  . FRACTURE SURGERY Left 06   arm  . JOINT REPLACEMENT Left 08   knee repl  . SHOULDER ARTHROSCOPY Right 2001  . SHOULDER ARTHROSCOPY Left 07  . TONSILLECTOMY    . TOTAL KNEE ARTHROPLASTY Right 03/30/2014   DR  NORRIS  . TOTAL KNEE ARTHROPLASTY Right 03/30/2014   Procedure: RIGHT TOTAL KNEE ARTHROPLASTY;  Surgeon: Beverely LowSteve Norris, MD;  [facility];  Service: Orthopedics;  Laterality: Right;   Social History   Social History Narrative  . Not on file    There is no immunization history on file for this patient.   Objective: Vital Signs: BP (!) 145/80 (BP Location: Left Arm, Patient Position: Sitting, Cuff Size: Normal)   Pulse 72   Resp 13   Ht 5' 5.5" (1.664 m)   Wt 176 lb 12.8 oz (80.2 kg)   BMI 28.97 kg/m    Physical Exam Vitals signs and nursing note reviewed.  Constitutional:      Appearance: She is well-developed.  HENT:     Head: Normocephalic and atraumatic.  Eyes:     Conjunctiva/sclera: Conjunctivae normal.  Neck:     Musculoskeletal: Normal range of motion.  Cardiovascular:     Rate and Rhythm: Normal rate and regular rhythm.     Heart sounds: Normal heart sounds.  Pulmonary:     Effort: Pulmonary effort is normal.     Breath sounds: Normal breath sounds.  Abdominal:     General: Bowel sounds are normal.     Palpations: Abdomen is soft.  Lymphadenopathy:     Cervical: No cervical adenopathy.  Skin:    General: Skin is warm and dry.     Capillary Refill: Capillary refill takes less than 2 seconds.  Neurological:     Mental Status: She is alert and oriented to person, place, and time.  Psychiatric:        Behavior: Behavior normal.      Musculoskeletal Exam: She has limited range of motion with lateral rotation of her C-spine.  She has painful range of motion of the lumbar spine.  No midline spinal tenderness.  No SI joint tenderness.  She has trapezius muscle spasms bilaterally.  Shoulder joints, elbow joints, wrist joints, MCPs, PIPs, DIPs good range of motion no synovitis.  She has PIP and DIP synovial thickening consistent with osteoarthritis of bilateral hands.  She has complete fist formation bilaterally.  Hip joints good range of motion with no discomfort.   No tenderness over trochanteric bursa bilaterally.  Bilateral knee replacements have good range of motion with no warmth or effusion.  Ankle joints good range of motion with no warmth or effusion.  CDAI Exam: CDAI Score: Not documented Patient Global Assessment: Not documented; Provider Global Assessment: Not documented Swollen: Not documented; Tender: Not documented Joint Exam   Not documented   There is currently no information documented on the homunculus. Go to the Rheumatology activity and complete the homunculus joint exam.  Investigation: No additional findings.  Imaging: No results found.  Recent Labs: Lab Results  Component Value Date   WBC 7.8 08/17/2017   HGB 14.0 08/17/2017   PLT 209 08/17/2017   NA 139 08/17/2017   K 4.0 08/17/2017   CL 102 08/17/2017   CO2 29 08/17/2017   GLUCOSE 90 08/17/2017   BUN 13 08/17/2017   CREATININE 0.99 08/17/2017   BILITOT 0.5 08/17/2017  ALKPHOS 62 08/17/2017   AST 33 08/17/2017   ALT 33 08/17/2017   PROT 7.7 08/17/2017   ALBUMIN 4.5 08/17/2017   CALCIUM 9.8 08/17/2017   GFRAA >60 08/17/2017    Speciality Comments: No specialty comments available.  Procedures:  Trigger Point Inj Date/Time: 02/03/2018 2:52 PM Performed by: Pollyann Savoy, MD Authorized by: Pollyann Savoy, MD   Consent Given by:  Patient Site marked: the procedure site was marked   Timeout: prior to procedure the correct patient, procedure, and site was verified   Indications:  Pain Total # of Trigger Points:  1 (Right trapezius muscle spasm) Location: neck   Needle Size:  27 G Approach:  Dorsal Medications #1:  0.5 mL lidocaine 1 %; 10 mg triamcinolone acetonide 40 MG/ML Patient tolerance:  Patient tolerated the procedure well with no immediate complications   Allergies: Azithromycin and Codeine   Assessment / Plan:     Visit Diagnoses: Myofascial pain syndrome: She continues to have generalized muscle aches and muscle tenderness due to  myofascial pain syndrome.  She is trapezius muscle spasms bilaterally.  A right trigger point injection was performed today in the office. She takes Robaxin 750 mg twice daily PRN for muscle spasms and Flexeril 10 mg by mouth at bedtime.  She does not need any refills at this time.   She continues to have chronic fatigue and insomnia.  She was encouraged to stay active and exercise on a regular basis.  Good sleep hygiene was discussed.  She will follow-up in the office in 6 months.  Medication monitoring encounter - She takes tramadol very sparingly. UDS: 7/18/2019Narc agreement: 07/29/2017  Trapezius muscle spasm: She has trapezius muscle spasms bilaterally.  She requested a right trigger point injection today.  She tolerated the procedure well.  The procedure note is completed above.  Age-related osteoporosis without current pathological fracture -She takes Fosamax 70 mg by mouth every week as well as a calcium and vitamin D supplement.  DDD (degenerative disc disease), cervical: She has limited range of motion with lateral rotation.  She has no symptoms of radiculopathy at this time.  She continues to have trapezius muscle spasms bilaterally worse on the right side.    DDD (degenerative disc disease), lumbar: Chronic pain.  She has painful range of motion of the lumbar spine.  No midline spinal tenderness was noted.  Status post total bilateral knee replacement using cement: Chronic pain.  She has good range of motion with no discomfort at this time.  No warmth or effusion noted.  History of vitamin D deficiency: She takes a vitamin D supplement.  Pain in both hands: She has PIP and DIP synovial thickening consistent with osteoarthritis of bilateral hands.  She has no synovitis on exam today.  She is been having increased pain and intermittent joint swelling in bilateral hands since December 2019.  She has no signs of inflammatory arthritis at this time.  She has no tenderness on exam.  She has  complete fist formation bilaterally.  Joint protection and muscle strengthening were discussed.  She was given a handout of exercises that she can perform at home.  Orders: Orders Placed This Encounter  Procedures  . Trigger Point Inj   No orders of the defined types were placed in this encounter.    Follow-Up Instructions: Return in about 6 months (around 08/04/2018) for Myofascial pain syndrome, DDD.   Gearldine Bienenstock, PA-C   I examined and evaluated the patient with Sherron Ales PA.  Patient is having a flare with increased pain and discomfort.  Right trapezius was injected with cortisone as described above.  She tolerated the procedure well.  The plan of care was discussed as noted above.  Pollyann SavoyShaili Deveshwar, MD  Note - This record has been created using Animal nutritionistDragon software.  Chart creation errors have been sought, but may not always  have been located. Such creation errors do not reflect on  the standard of medical care.

## 2018-02-03 ENCOUNTER — Ambulatory Visit: Payer: Medicare Other | Admitting: Rheumatology

## 2018-02-03 ENCOUNTER — Encounter: Payer: Self-pay | Admitting: Rheumatology

## 2018-02-03 VITALS — BP 145/80 | HR 72 | Resp 13 | Ht 65.5 in | Wt 176.8 lb

## 2018-02-03 DIAGNOSIS — Z96653 Presence of artificial knee joint, bilateral: Secondary | ICD-10-CM

## 2018-02-03 DIAGNOSIS — M810 Age-related osteoporosis without current pathological fracture: Secondary | ICD-10-CM | POA: Diagnosis not present

## 2018-02-03 DIAGNOSIS — Z5181 Encounter for therapeutic drug level monitoring: Secondary | ICD-10-CM

## 2018-02-03 DIAGNOSIS — M62838 Other muscle spasm: Secondary | ICD-10-CM | POA: Diagnosis not present

## 2018-02-03 DIAGNOSIS — M79641 Pain in right hand: Secondary | ICD-10-CM

## 2018-02-03 DIAGNOSIS — M5030 Other cervical disc degeneration, unspecified cervical region: Secondary | ICD-10-CM | POA: Diagnosis not present

## 2018-02-03 DIAGNOSIS — M79642 Pain in left hand: Secondary | ICD-10-CM

## 2018-02-03 DIAGNOSIS — Z8639 Personal history of other endocrine, nutritional and metabolic disease: Secondary | ICD-10-CM

## 2018-02-03 DIAGNOSIS — M7918 Myalgia, other site: Secondary | ICD-10-CM

## 2018-02-03 DIAGNOSIS — M5136 Other intervertebral disc degeneration, lumbar region: Secondary | ICD-10-CM

## 2018-02-03 MED ORDER — LIDOCAINE HCL 1 % IJ SOLN
0.5000 mL | INTRAMUSCULAR | Status: AC | PRN
  Administered 2018-02-03: .5 mL

## 2018-02-03 MED ORDER — TRIAMCINOLONE ACETONIDE 40 MG/ML IJ SUSP
10.0000 mg | INTRAMUSCULAR | Status: AC | PRN
  Administered 2018-02-03: 10 mg via INTRAMUSCULAR

## 2018-02-03 NOTE — Patient Instructions (Signed)

## 2018-05-22 ENCOUNTER — Other Ambulatory Visit: Payer: Self-pay | Admitting: Rheumatology

## 2018-05-23 NOTE — Telephone Encounter (Signed)
Last Visit: 02/03/18 Next Visit: 08/04/18  Okay to refill per Dr. Corliss Skains

## 2018-07-22 NOTE — Progress Notes (Deleted)
Office Visit Note  Patient: Meghan Alvarez             Date of Birth: Jan 16, 1934           MRN: [id number]             PCP: Hulan Fess, MD Referring: Hulan Fess, MD Visit Date: 07/25/2018 Occupation: @GUAROCC @  Subjective:  No chief complaint on file.   History of Present Illness: Meghan Alvarez is a 84 y.o. female ***   Activities of Daily Living:  Patient reports morning stiffness for *** {minute/hour:19697}.   Patient {ACTIONS;DENIES/REPORTS:21021675::"Denies"} nocturnal pain.  Difficulty dressing/grooming: {ACTIONS;DENIES/REPORTS:21021675::"Denies"} Difficulty climbing stairs: {ACTIONS;DENIES/REPORTS:21021675::"Denies"} Difficulty getting out of chair: {ACTIONS;DENIES/REPORTS:21021675::"Denies"} Difficulty using hands for taps, buttons, cutlery, and/or writing: {ACTIONS;DENIES/REPORTS:21021675::"Denies"}  No Rheumatology ROS completed.   PMFS History:  Patient Active Problem List   Diagnosis Date Noted  . Myofascial pain syndrome 02/04/2016  . Spondylosis of lumbar region without myelopathy or radiculopathy 02/04/2016  . DJD (degenerative joint disease), cervical 02/04/2016  . Age-related osteoporosis without current pathological fracture 02/04/2016  . Vitamin D deficiency 02/04/2016  . S/P TKR (total knee replacement) using cement 03/30/2014    Past Medical History:  Diagnosis Date  . Arthritis   . Complication of anesthesia   . Diabetes mellitus without complication ([facility])   . Fibromyalgia   . PONV (postoperative nausea and vomiting)     Family History  Problem Relation Age of Onset  . Cancer Sister        breast  . Diabetes Brother    Past Surgical History:  Procedure Laterality Date  . APPENDECTOMY  04   ruptured   . CERVICAL DISCECTOMY  91  . DILATION AND CURETTAGE OF UTERUS  05  . FRACTURE SURGERY Left 06   arm  . JOINT REPLACEMENT Left 08   knee repl  . SHOULDER ARTHROSCOPY Right 2001  . SHOULDER ARTHROSCOPY Left 07  . TONSILLECTOMY     . TOTAL KNEE ARTHROPLASTY Right 03/30/2014   DR NORRIS  . TOTAL KNEE ARTHROPLASTY Right 03/30/2014   Procedure: RIGHT TOTAL KNEE ARTHROPLASTY;  Surgeon: Netta Cedars, MD;  Location: [facility];  Service: Orthopedics;  Laterality: Right;   Social History   Social History Narrative  . Not on file    There is no immunization history on file for this patient.   Objective: Vital Signs: There were no vitals taken for this visit.   Physical Exam   Musculoskeletal Exam: ***  CDAI Exam: CDAI Score: - Patient Global: -; Provider Global: - Swollen: -; Tender: - Joint Exam   No joint exam has been documented for this visit   There is currently no information documented on the homunculus. Go to the Rheumatology activity and complete the homunculus joint exam.  Investigation: No additional findings.  Imaging: No results found.  Recent Labs: Lab Results  Component Value Date   WBC 7.8 08/17/2017   HGB 14.0 08/17/2017   PLT 209 08/17/2017   NA 139 08/17/2017   K 4.0 08/17/2017   CL 102 08/17/2017   CO2 29 08/17/2017   GLUCOSE 90 08/17/2017   BUN 13 08/17/2017   CREATININE 0.99 08/17/2017   BILITOT 0.5 08/17/2017   ALKPHOS 62 08/17/2017   AST 33 08/17/2017   ALT 33 08/17/2017   PROT 7.7 08/17/2017   ALBUMIN 4.5 08/17/2017   CALCIUM 9.8 08/17/2017   GFRAA >60 08/17/2017    Speciality Comments: No specialty comments available.  Procedures:  No procedures performed Allergies:  Azithromycin and Codeine   Assessment / Plan:     Visit Diagnoses: No diagnosis found.  Orders: No orders of the defined types were placed in this encounter.  No orders of the defined types were placed in this encounter.   Face-to-face time spent with patient was *** minutes. Greater than 50% of time was spent in counseling and coordination of care.  Follow-Up Instructions: No follow-ups on file.   Ellen HenriMarissa C Mckenlee Mangham, CMA  Note - This record has been created using Animal nutritionistDragon software.  Chart  creation errors have been sought, but may not always  have been located. Such creation errors do not reflect on  the standard of medical care.

## 2018-07-25 ENCOUNTER — Ambulatory Visit: Payer: Medicare Other | Admitting: Rheumatology

## 2018-08-04 ENCOUNTER — Ambulatory Visit: Payer: Self-pay | Admitting: Rheumatology

## 2018-08-10 NOTE — Progress Notes (Signed)
Office Visit Note  Patient: Meghan Alvarez             Date of Birth: July 31, 1934           MRN: [id number]             PCP: Hulan Fess, MD Referring: Hulan Fess, MD Visit Date: 08/12/2018 Occupation: @GUAROCC @  Subjective:  Pain in neck and shoulders    History of Present Illness: Meghan Alvarez is a 84 y.o. female with history of myofascial pain syndrome and degenerative disc disease.  She states she had some thoracic pain in the rib cage which eventually resolved.  Patient continues to have some discomfort in the trapezius area and in her knee joints.  She denies any joint swelling.  Her right total knee replacement is doing well.  Activities of Daily Living:  Patient reports morning stiffness for 10 minutes.   Patient Denies nocturnal pain.  Difficulty dressing/grooming: Denies Difficulty climbing stairs: Denies Difficulty getting out of chair: Denies Difficulty using hands for taps, buttons, cutlery, and/or writing: Denies  Review of Systems  Constitutional: Positive for fatigue. Negative for night sweats, weight gain and weight loss.  HENT: Negative for mouth sores, trouble swallowing, trouble swallowing, mouth dryness and nose dryness.   Eyes: Negative for pain, redness, visual disturbance and dryness.  Respiratory: Negative for cough, shortness of breath and difficulty breathing.   Cardiovascular: Negative for chest pain, palpitations, hypertension, irregular heartbeat and swelling in legs/feet.  Gastrointestinal: Positive for constipation. Negative for blood in stool and diarrhea.  Endocrine: Negative for increased urination.  Genitourinary: Negative for painful urination and vaginal dryness.  Musculoskeletal: Positive for arthralgias, joint pain, joint swelling, morning stiffness and muscle tenderness. Negative for myalgias, muscle weakness and myalgias.  Skin: Negative for color change, rash, hair loss, skin tightness, ulcers and sensitivity to sunlight.   Allergic/Immunologic: Negative for susceptible to infections.  Neurological: Negative for dizziness, numbness, memory loss, night sweats and weakness.  Hematological: Negative for bruising/bleeding tendency and swollen glands.  Psychiatric/Behavioral: Negative for depressed mood and sleep disturbance. The patient is not nervous/anxious.     PMFS History:  Patient Active Problem List   Diagnosis Date Noted  . Myofascial pain syndrome 02/04/2016  . Spondylosis of lumbar region without myelopathy or radiculopathy 02/04/2016  . DJD (degenerative joint disease), cervical 02/04/2016  . Age-related osteoporosis without current pathological fracture 02/04/2016  . Vitamin D deficiency 02/04/2016  . S/P TKR (total knee replacement) using cement 03/30/2014    Past Medical History:  Diagnosis Date  . Arthritis   . Complication of anesthesia   . Diabetes mellitus without complication ([place])   . Fibromyalgia   . PONV (postoperative nausea and vomiting)     Family History  Problem Relation Age of Onset  . Cancer Sister        breast  . Diabetes Brother    Past Surgical History:  Procedure Laterality Date  . APPENDECTOMY  04   ruptured   . CERVICAL DISCECTOMY  91  . DILATION AND CURETTAGE OF UTERUS  05  . FRACTURE SURGERY Left 06   arm  . JOINT REPLACEMENT Left 08   knee repl  . SHOULDER ARTHROSCOPY Right 2001  . SHOULDER ARTHROSCOPY Left 07  . TONSILLECTOMY    . TOTAL KNEE ARTHROPLASTY Right 03/30/2014   DR NORRIS  . TOTAL KNEE ARTHROPLASTY Right 03/30/2014   Procedure: RIGHT TOTAL KNEE ARTHROPLASTY;  Surgeon: Netta Cedars, MD;  Location: [facility];  Service: Orthopedics;  Laterality: Right;   Social History   Social History Narrative  . Not on file    There is no immunization history on file for this patient.   Objective: Vital Signs: BP (!) 149/73 (BP Location: Left Arm, Patient Position: Sitting, Cuff Size: Normal)   Pulse 78   Resp 16   Ht 5' 5.5" (1.664 m)   Wt 177 lb  (80.3 kg)   BMI 29.01 kg/m    Physical Exam Vitals signs and nursing note reviewed.  Constitutional:      Appearance: She is well-developed.  HENT:     Head: Normocephalic and atraumatic.  Eyes:     Conjunctiva/sclera: Conjunctivae normal.  Neck:     Musculoskeletal: Normal range of motion.  Cardiovascular:     Rate and Rhythm: Normal rate and regular rhythm.     Heart sounds: Normal heart sounds.  Pulmonary:     Effort: Pulmonary effort is normal.     Breath sounds: Normal breath sounds.  Abdominal:     General: Bowel sounds are normal.     Palpations: Abdomen is soft.  Lymphadenopathy:     Cervical: No cervical adenopathy.  Skin:    General: Skin is warm and dry.     Capillary Refill: Capillary refill takes less than 2 seconds.  Neurological:     Mental Status: She is alert and oriented to person, place, and time.  Psychiatric:        Behavior: Behavior normal.      Musculoskeletal Exam: Patient has some limitation range of motion of her cervical spine.  Shoulder joints, elbow joints, wrist joints with good range of motion with no synovitis.  MCPs, PIPs and DIPs with good range of motion.  She has some PIP and DIP thickening in her hands.  Hip joints, knee joints with good range of motion.  Her right knee joint has been replaced which is doing well.  CDAI Exam: CDAI Score: - Patient Global: -; Provider Global: - Swollen: -; Tender: - Joint Exam   No joint exam has been documented for this visit   There is currently no information documented on the homunculus. Go to the Rheumatology activity and complete the homunculus joint exam.  Investigation: No additional findings.  Imaging: No results found.  Recent Labs: Lab Results  Component Value Date   WBC 7.8 08/17/2017   HGB 14.0 08/17/2017   PLT 209 08/17/2017   NA 139 08/17/2017   K 4.0 08/17/2017   CL 102 08/17/2017   CO2 29 08/17/2017   GLUCOSE 90 08/17/2017   BUN 13 08/17/2017   CREATININE 0.99  08/17/2017   BILITOT 0.5 08/17/2017   ALKPHOS 62 08/17/2017   AST 33 08/17/2017   ALT 33 08/17/2017   PROT 7.7 08/17/2017   ALBUMIN 4.5 08/17/2017   CALCIUM 9.8 08/17/2017   GFRAA >60 08/17/2017    Speciality Comments: No specialty comments available.  Procedures:  Trigger Point Inj  Date/Time: 08/12/2018 12:11 PM Performed by: Pollyann Savoyeveshwar, Ishaaq Penna, MD Authorized by: Pollyann Savoyeveshwar, Shawntez Dickison, MD   Consent Given by:  Patient Site marked: the procedure site was marked   Timeout: prior to procedure the correct patient, procedure, and site was verified   Indications:  Muscle spasm and pain Total # of Trigger Points:  2 Location: neck   Needle Size:  27 G Approach:  Dorsal Medications #1:  0.5 mL lidocaine 1 %; 10 mg triamcinolone acetonide 40 MG/ML Medications #2:  0.5 mL lidocaine 1 %; 10 mg triamcinolone  acetonide 40 MG/ML Patient tolerance:  Patient tolerated the procedure well with no immediate complications   Allergies: Azithromycin and Codeine   Assessment / Plan:     Visit Diagnoses: Myofascial pain syndrome -she takes Robaxin twice a day, and Flexeril only on PRN basis.  She states recently she has been doing quite well and has not to take oxycodone long time.  She has been having some discomfort in her muscles but overall has improved.  Trapezius muscle spasm -patient had lot of discomfort on palpation of bilateral trapezius area.  Per her request but a trapezius area were injected with cortisone as described above.  She tolerated the procedure well.  DDD (degenerative disc disease), cervical -she has limited range of motion of cervical spine without much discomfort.  DDD (degenerative disc disease), lumbar -she had a flare few months ago with lower back pain but the pain has resolved now.  Status post total bilateral knee replacement using cement -doing well.  Age-related osteoporosis without current pathological fracture - Fosamax 70 mg by mouth every week prescribed and  managed by her PCP.  History of vitamin D deficiency -she takes over-the-counter supplement.  Orders: Orders Placed This Encounter  Procedures  . Trigger Point Inj   No orders of the defined types were placed in this encounter.   Follow-Up Instructions: Return in about 6 months (around 02/12/2019) for MFPS, DDD.   Pollyann SavoyShaili Jackee Glasner, MD  Note - This record has been created using Animal nutritionistDragon software.  Chart creation errors have been sought, but may not always  have been located. Such creation errors do not reflect on  the standard of medical care.

## 2018-08-12 ENCOUNTER — Encounter: Payer: Self-pay | Admitting: Rheumatology

## 2018-08-12 ENCOUNTER — Other Ambulatory Visit: Payer: Self-pay

## 2018-08-12 ENCOUNTER — Ambulatory Visit: Payer: Medicare Other | Admitting: Rheumatology

## 2018-08-12 VITALS — BP 149/73 | HR 78 | Resp 16 | Ht 65.5 in | Wt 177.0 lb

## 2018-08-12 DIAGNOSIS — M5030 Other cervical disc degeneration, unspecified cervical region: Secondary | ICD-10-CM

## 2018-08-12 DIAGNOSIS — M7918 Myalgia, other site: Secondary | ICD-10-CM

## 2018-08-12 DIAGNOSIS — M5136 Other intervertebral disc degeneration, lumbar region: Secondary | ICD-10-CM | POA: Diagnosis not present

## 2018-08-12 DIAGNOSIS — M62838 Other muscle spasm: Secondary | ICD-10-CM

## 2018-08-12 DIAGNOSIS — M810 Age-related osteoporosis without current pathological fracture: Secondary | ICD-10-CM

## 2018-08-12 DIAGNOSIS — Z96653 Presence of artificial knee joint, bilateral: Secondary | ICD-10-CM

## 2018-08-12 DIAGNOSIS — Z8639 Personal history of other endocrine, nutritional and metabolic disease: Secondary | ICD-10-CM

## 2018-08-12 MED ORDER — LIDOCAINE HCL 1 % IJ SOLN
0.5000 mL | INTRAMUSCULAR | Status: AC | PRN
  Administered 2018-08-12: .5 mL

## 2018-08-12 MED ORDER — TRIAMCINOLONE ACETONIDE 40 MG/ML IJ SUSP
10.0000 mg | INTRAMUSCULAR | Status: AC | PRN
  Administered 2018-08-12: 10 mg via INTRAMUSCULAR

## 2018-08-18 ENCOUNTER — Other Ambulatory Visit: Payer: Self-pay | Admitting: Rheumatology

## 2018-08-18 NOTE — Telephone Encounter (Signed)
Last Visit: 08/12/18 Next Visit: 02/09/19  Okay to refill per Dr. Estanislado Pandy

## 2018-09-07 ENCOUNTER — Ambulatory Visit: Payer: Medicare Other | Admitting: Rheumatology

## 2018-11-30 ENCOUNTER — Other Ambulatory Visit: Payer: Self-pay | Admitting: Rheumatology

## 2018-12-01 NOTE — Telephone Encounter (Signed)
Last Visit: 08/12/18 Next Visit: 02/09/19  Okay to refill per Dr. Estanislado Pandy

## 2019-02-01 NOTE — Progress Notes (Signed)
Office Visit Note  Patient: Meghan Alvarez             Date of Birth: 1934/06/21           MRN: [id number]             PCP: Catha Gosselin, MD Referring: Catha Gosselin, MD Visit Date: 02/09/2019 Occupation: @GUAROCC @  Subjective:  Trapezius muscle spasms   History of Present Illness: Meghan Alvarez is a 85 y.o. female with history of myofascial pain and DDD.  She continues have generalized muscle aches muscle tenderness due to fibromyalgia.  She has trapezius muscle tension and muscle tenderness bilaterally.  She requested trigger point injections today.  She trigger point junctions bilaterally on 08/12/2018 which provided significant relief.  She states that she received the second Shingrix vaccine in November 2020 and had significant discomfort in her left deltoid for about 2 months after.  She had 2 rounds of prednisone which eventually resolved her symptoms.  She takes robaxin 750 mg BID prn for muscle spasms and flexeril 10 mg po at bedtime as needed for muscle spasms. She takes tramadol very sparingly for severe pain relief. She is taking Savella 50 mg 1 tablet BID   Activities of Daily Living:  Patient reports morning stiffness for 1 hour.   Patient Reports nocturnal pain.  Difficulty dressing/grooming: Denies Difficulty climbing stairs: Denies Difficulty getting out of chair: Denies Difficulty using hands for taps, buttons, cutlery, and/or writing: Denies  Review of Systems  Constitutional: Negative for fatigue.  HENT: Negative for mouth sores, mouth dryness and nose dryness.   Eyes: Negative for pain, itching, visual disturbance and dryness.  Respiratory: Negative for cough, hemoptysis, shortness of breath, wheezing and difficulty breathing.   Cardiovascular: Negative for palpitations, hypertension, irregular heartbeat and swelling in legs/feet.  Gastrointestinal: Positive for constipation. Negative for blood in stool and diarrhea.  Endocrine: Negative for increased  urination.  Genitourinary: Negative for difficulty urinating and painful urination.  Musculoskeletal: Positive for arthralgias, joint pain, joint swelling and morning stiffness. Negative for myalgias, muscle weakness, muscle tenderness and myalgias.  Skin: Negative for color change, pallor, rash, hair loss, nodules/bumps, skin tightness, ulcers and sensitivity to sunlight.  Allergic/Immunologic: Negative for susceptible to infections.  Neurological: Negative for numbness, headaches, memory loss and weakness.  Hematological: Negative for bruising/bleeding tendency and swollen glands.  Psychiatric/Behavioral: Negative for depressed mood, confusion and sleep disturbance. The patient is not nervous/anxious.     PMFS History:  Patient Active Problem List   Diagnosis Date Noted  . Myofascial pain syndrome 02/04/2016  . Spondylosis of lumbar region without myelopathy or radiculopathy 02/04/2016  . DJD (degenerative joint disease), cervical 02/04/2016  . Age-related osteoporosis without current pathological fracture 02/04/2016  . Vitamin D deficiency 02/04/2016  . S/P TKR (total knee replacement) using cement 03/30/2014    Past Medical History:  Diagnosis Date  . Arthritis   . Complication of anesthesia   . Diabetes mellitus without complication ([facility])   . Fibromyalgia   . PONV (postoperative nausea and vomiting)     Family History  Problem Relation Age of Onset  . Cancer Sister        breast  . Diabetes Brother    Past Surgical History:  Procedure Laterality Date  . APPENDECTOMY  04   ruptured   . CERVICAL DISCECTOMY  91  . DILATION AND CURETTAGE OF UTERUS  05  . FRACTURE SURGERY Left 06   arm  . JOINT REPLACEMENT Left 08  knee repl  . SHOULDER ARTHROSCOPY Right 2001  . SHOULDER ARTHROSCOPY Left 07  . TONSILLECTOMY    . TOTAL KNEE ARTHROPLASTY Right 03/30/2014   DR NORRIS  . TOTAL KNEE ARTHROPLASTY Right 03/30/2014   Procedure: RIGHT TOTAL KNEE ARTHROPLASTY;  Surgeon: Netta Cedars, MD;  Location: [facility];  Service: Orthopedics;  Laterality: Right;   Social History   Social History Narrative  . Not on file    There is no immunization history on file for this patient.   Objective: Vital Signs: BP (!) 143/87 (BP Location: Left Arm, Patient Position: Sitting, Cuff Size: Normal)   Pulse 90   Resp 12   Ht 5' 5.5" (1.664 m)   Wt 176 lb (79.8 kg)   BMI 28.84 kg/m    Physical Exam Vitals and nursing note reviewed.  Constitutional:      Appearance: She is well-developed.  HENT:     Head: Normocephalic and atraumatic.  Eyes:     Conjunctiva/sclera: Conjunctivae normal.  Cardiovascular:     Rate and Rhythm: Normal rate and regular rhythm.     Heart sounds: Normal heart sounds.  Pulmonary:     Effort: Pulmonary effort is normal.     Breath sounds: Normal breath sounds.  Abdominal:     General: Bowel sounds are normal.     Palpations: Abdomen is soft.  Musculoskeletal:     Cervical back: Normal range of motion.  Lymphadenopathy:     Cervical: No cervical adenopathy.  Skin:    General: Skin is warm and dry.     Capillary Refill: Capillary refill takes less than 2 seconds.  Neurological:     Mental Status: She is alert and oriented to person, place, and time.  Psychiatric:        Behavior: Behavior normal.      Musculoskeletal Exam: C-spine limited range of motion with lateral rotation.  She is trapezius muscle tension and muscle spasms bilaterally.  Shoulder joints, elbow joints, wrist joints and MCPs and PIPs and DIPs good range of motion no synovitis.  She has PIP and DIP synovial thickening consistent with osteoarthritis of both hands.  She has complete fist formation bilaterally.  Hip joints have good range of motion with no discomfort.  Knee joints have good range of motion with no warmth or effusion.  Ankle joints have good range of motion with no tenderness or inflammation.  No tenderness over trochanteric bursa bilaterally.  CDAI Exam: CDAI  Score: -- Patient Global: --; Provider Global: -- Swollen: --; Tender: -- Joint Exam 02/09/2019   No joint exam has been documented for this visit   There is currently no information documented on the homunculus. Go to the Rheumatology activity and complete the homunculus joint exam.  Investigation: No additional findings.  Imaging: No results found.  Recent Labs: Lab Results  Component Value Date   WBC 7.8 08/17/2017   HGB 14.0 08/17/2017   PLT 209 08/17/2017   NA 139 08/17/2017   K 4.0 08/17/2017   CL 102 08/17/2017   CO2 29 08/17/2017   GLUCOSE 90 08/17/2017   BUN 13 08/17/2017   CREATININE 0.99 08/17/2017   BILITOT 0.5 08/17/2017   ALKPHOS 62 08/17/2017   AST 33 08/17/2017   ALT 33 08/17/2017   PROT 7.7 08/17/2017   ALBUMIN 4.5 08/17/2017   CALCIUM 9.8 08/17/2017   GFRAA >60 08/17/2017    Speciality Comments: No specialty comments available.  Procedures:  Trigger Point Inj  Date/Time: 02/09/2019 11:58  AM Performed by: Pollyann Savoy, MD Authorized by: Pollyann Savoy, MD   Consent Given by:  Patient Site marked: the procedure site was marked   Timeout: prior to procedure the correct patient, procedure, and site was verified   Indications:  Pain Total # of Trigger Points:  2 Location: neck   Needle Size:  27 G Approach:  Dorsal Medications #1:  0.5 mL lidocaine 1 %; 10 mg triamcinolone acetonide 40 MG/ML Medications #2:  0.5 mL lidocaine 1 %; 10 mg triamcinolone acetonide 40 MG/ML Patient tolerance:  Patient tolerated the procedure well with no immediate complications   Allergies: Azithromycin and Codeine   Assessment / Plan:     Visit Diagnoses: Myofascial pain syndrome -she continues have generalized muscle aches muscle tenderness due to myofascial pain syndrome.  She presents today with trapezius muscle tension and muscle spasms.  She received bilateral trigger point injections on 08/12/2018 which provided significant pain relief.  She requested  bilateral trigger point junctions today.  She tolerated procedure well.  The procedure note was completed above.  She continues to take Robaxin 750 mg 1 tablet twice daily for muscle spasms and Flexeril 10 mg by mouth at bedtime as needed.  She takes tramadol very sparingly for pain relief.  We discussed the importance of regular exercise and good sleep hygiene.  She has been walking for exercise on a regular basis.  She will follow-up in the office in 6 months.  Trapezius muscle spasm: She presents today with trapezius muscle tension and muscle tenderness bilaterally.  She has been experiencing muscle spasms intermittently.  She received her second Shingrix vaccine in November 2020 and experienced left shoulder discomfort for over 1 month afterward.  She took 2 rounds of prednisone which eventually resolved her discomfort.  She has good range of motion left shoulder joint on exam today.  She takes Robaxin 750 mg 1 tablet twice daily for muscle spasms and Flexeril 10 mg by mouth at bedtime.  She requested trigger point injections today.  She tolerated the procedure well.  The procedure note was completed above.  DDD (degenerative disc disease), cervical: She has limited range of motion with lateral rotation.  She has good flexion extension with some discomfort.  She has trapezius muscle tension and muscle spasms bilaterally.  She requested trigger point injections today.  DDD (degenerative disc disease), lumbar: She has good range of motion with some discomfort in her lower back.  She has no symptoms of radiculopathy at this time.  Status post total bilateral knee replacement using cement: Bilateral knee replacements have good range of motion with no discomfort.  No warmth or effusion was noted.  She has no difficulty climbing steps or getting up from a chair.  She walks for exercise on a regular basis.  Age-related osteoporosis without current pathological fracture -She is taking Fosamax 70 mg by mouth  every week prescribed and managed by her PCP.  She continues to take a vitamin D supplement on a daily basis.  History of vitamin D deficiency: She is taking vitamin D 1000 units by mouth daily.  Orders: Orders Placed This Encounter  Procedures  . Trigger Point Inj   No orders of the defined types were placed in this encounter.     Follow-Up Instructions: Return in about 6 months (around 08/09/2019) for Myofascial pain, DDD.   Gearldine Bienenstock, PA-C   I examined and evaluated the patient with Sherron Ales PA.  Patient continues to have myofascial pain.  She  has been having trapezius pain.  Per her request bilateral trapezius area were injected as described above.  She tolerated the procedure well.  The plan of care was discussed as noted above.  Pollyann Savoy, MD  Note - This record has been created using Animal nutritionist.  Chart creation errors have been sought, but may not always  have been located. Such creation errors do not reflect on  the standard of medical care.

## 2019-02-09 ENCOUNTER — Encounter: Payer: Self-pay | Admitting: Rheumatology

## 2019-02-09 ENCOUNTER — Other Ambulatory Visit: Payer: Self-pay

## 2019-02-09 ENCOUNTER — Ambulatory Visit: Payer: Medicare Other | Admitting: Rheumatology

## 2019-02-09 VITALS — BP 143/87 | HR 90 | Resp 12 | Ht 65.5 in | Wt 176.0 lb

## 2019-02-09 DIAGNOSIS — M5136 Other intervertebral disc degeneration, lumbar region: Secondary | ICD-10-CM | POA: Diagnosis not present

## 2019-02-09 DIAGNOSIS — M5030 Other cervical disc degeneration, unspecified cervical region: Secondary | ICD-10-CM

## 2019-02-09 DIAGNOSIS — M7918 Myalgia, other site: Secondary | ICD-10-CM

## 2019-02-09 DIAGNOSIS — M62838 Other muscle spasm: Secondary | ICD-10-CM

## 2019-02-09 DIAGNOSIS — Z96653 Presence of artificial knee joint, bilateral: Secondary | ICD-10-CM

## 2019-02-09 DIAGNOSIS — M810 Age-related osteoporosis without current pathological fracture: Secondary | ICD-10-CM

## 2019-02-09 DIAGNOSIS — Z8639 Personal history of other endocrine, nutritional and metabolic disease: Secondary | ICD-10-CM

## 2019-02-09 MED ORDER — TRIAMCINOLONE ACETONIDE 40 MG/ML IJ SUSP
10.0000 mg | INTRAMUSCULAR | Status: AC | PRN
  Administered 2019-02-09: 10 mg via INTRAMUSCULAR

## 2019-02-09 MED ORDER — LIDOCAINE HCL 1 % IJ SOLN
0.5000 mL | INTRAMUSCULAR | Status: AC | PRN
  Administered 2019-02-09: .5 mL

## 2019-03-11 ENCOUNTER — Other Ambulatory Visit: Payer: Self-pay | Admitting: Rheumatology

## 2019-03-13 NOTE — Telephone Encounter (Signed)
Last Visit: 02/09/19 Next Visit: 08/08/19  Okay to refill per Dr. Deveshwar  

## 2019-07-11 ENCOUNTER — Other Ambulatory Visit: Payer: Self-pay | Admitting: *Deleted

## 2019-07-11 NOTE — Telephone Encounter (Signed)
Received a Mining engineer from [employer] regarding Methocarbamol  Reviewed by Sherron Ales, PA-C  Recommendation: Please call the patient to clarify how she is taking Methocarbamol. Please explain risks: Sedation, increased risk of fracture.  She is on high dose methocarbamol and we recommend reducing the dose to 500 mg 1 tab PRN for muscle spasms if possible.

## 2019-07-18 MED ORDER — METHOCARBAMOL 500 MG PO TABS: 500.0000 mg | ORAL_TABLET | ORAL | 0 refills | Status: DC | PRN

## 2019-07-18 NOTE — Telephone Encounter (Signed)
Lurena Joiner from [facility] called to request prescription refill for the patient of Methocarbamol to be sent to new pharmacy [facility].   Lurena Joiner states "we need to know if the prescription cannot be filled since the patient is going out of town on Thursday."  Please call my direct line at #[phone]

## 2019-07-18 NOTE — Telephone Encounter (Signed)
Patient states she is taking the Methocarbamol  750 mg twice daily. Aptietn advised risks include Sedation and  increased risk of fracture.  She is on high dose methocarbamol. Patient advised we recommend reducing the dose to 500 mg 1 tab PRN for muscle spasms if possible. Patient is in agreement.

## 2019-07-27 NOTE — Progress Notes (Signed)
Office Visit Note  Patient: Meghan Alvarez             Date of Birth: 03/12/1934           MRN: [id number]             PCP: Catha GosselinLittle, Kevin, MD Referring: Catha GosselinLittle, Kevin, MD Visit Date: 08/08/2019 Occupation: @GUAROCC @  Subjective:  Right trapezius muscle tension   History of Present Illness: Meghan Alvarez is a 85 y.o. female with history of myofascial pain and osteoporosis.  She has been experiencing numbness and tingling in the right hand intermittently.  She states that she is also been having some nocturnal pain and paresthesias.  She is concerned about the discomfort she is experiencing since she will be restarting her bowling league next month.  She is currently having trapezius muscle tension and muscle tenderness especially on the right side and would like trigger point injections today.  She experiences temporary relief with these injections. She is taking tramadol 50 mg BID PRN for pain relief.  She uses voltaren gel topically as needed for trapezius muscle tension and tenderness. She has chronic pain in both knees which are replaced.  She is going to water aerobics twice a week.   She was previously taking fosamax but has discontinued after 5 years of use per recommendation of her PCP according to the patient. She denies any falls or fractures.    Activities of Daily Living:  Patient reports morning stiffness for 15 minutes.   Patient Denies nocturnal pain.  Difficulty dressing/grooming: Denies Difficulty climbing stairs: Denies Difficulty getting out of chair: Denies Difficulty using hands for taps, buttons, cutlery, and/or writing: Denies  Review of Systems  Constitutional: Positive for fatigue.  HENT: Negative for mouth sores, mouth dryness and nose dryness.   Eyes: Positive for dryness. Negative for pain and visual disturbance.  Respiratory: Negative for cough, hemoptysis, shortness of breath and difficulty breathing.   Cardiovascular: Positive for swelling in  legs/feet. Negative for chest pain, palpitations and hypertension.  Gastrointestinal: Positive for constipation. Negative for blood in stool and diarrhea.  Endocrine: Negative for excessive thirst and increased urination.  Genitourinary: Negative for difficulty urinating and painful urination.  Musculoskeletal: Positive for arthralgias, joint pain, joint swelling, muscle weakness, morning stiffness and muscle tenderness. Negative for myalgias and myalgias.  Skin: Negative for color change, pallor, rash, hair loss, nodules/bumps, skin tightness, ulcers and sensitivity to sunlight.  Allergic/Immunologic: Negative for susceptible to infections.  Neurological: Positive for numbness and weakness. Negative for dizziness and headaches.  Hematological: Negative for bruising/bleeding tendency and swollen glands.  Psychiatric/Behavioral: Negative for depressed mood and sleep disturbance. The patient is not nervous/anxious.     PMFS History:  Patient Active Problem List   Diagnosis Date Noted  . Myofascial pain syndrome 02/04/2016  . Spondylosis of lumbar region without myelopathy or radiculopathy 02/04/2016  . DJD (degenerative joint disease), cervical 02/04/2016  . Age-related osteoporosis without current pathological fracture 02/04/2016  . Vitamin D deficiency 02/04/2016  . S/P TKR (total knee replacement) using cement 03/30/2014    Past Medical History:  Diagnosis Date  . Arthritis   . Complication of anesthesia   . Diabetes mellitus without complication (HCC)   . Fibromyalgia   . PONV (postoperative nausea and vomiting)     Family History  Problem Relation Age of Onset  . Cancer Sister        breast  . Diabetes Brother    Past Surgical History:  Procedure Laterality  Date  . APPENDECTOMY  04   ruptured   . CERVICAL DISCECTOMY  91  . DILATION AND CURETTAGE OF UTERUS  05  . FRACTURE SURGERY Left 06   arm  . JOINT REPLACEMENT Left 08   knee repl  . SHOULDER ARTHROSCOPY Right 2001    . SHOULDER ARTHROSCOPY Left 07  . TONSILLECTOMY    . TOTAL KNEE ARTHROPLASTY Right 03/30/2014   DR NORRIS  . TOTAL KNEE ARTHROPLASTY Right 03/30/2014   Procedure: RIGHT TOTAL KNEE ARTHROPLASTY;  Surgeon: Beverely Low, MD;  Location: [facility] OR;  Service: Orthopedics;  Laterality: Right;   Social History   Social History Narrative  . Not on file    There is no immunization history on file for this patient.   Objective: Vital Signs: BP (!) 143/78 (BP Location: Left Arm, Patient Position: Sitting, Cuff Size: Normal)   Pulse 105   Ht 5\' 5"  (1.651 m)   Wt 182 lb 6.4 oz (82.7 kg)   BMI 30.35 kg/m    Physical Exam Vitals and nursing note reviewed.  Constitutional:      Appearance: She is well-developed.  HENT:     Head: Normocephalic and atraumatic.  Eyes:     Conjunctiva/sclera: Conjunctivae normal.  Pulmonary:     Effort: Pulmonary effort is normal.  Abdominal:     General: Bowel sounds are normal.     Palpations: Abdomen is soft.  Musculoskeletal:     Cervical back: Normal range of motion.  Lymphadenopathy:     Cervical: No cervical adenopathy.  Skin:    General: Skin is warm and dry.     Capillary Refill: Capillary refill takes less than 2 seconds.  Neurological:     Mental Status: She is alert and oriented to person, place, and time.  Psychiatric:        Behavior: Behavior normal.      Musculoskeletal Exam: C-spine limited ROM with lateral rotation.  Good flexion and extension of the C-spine.  Mild postural thoracic kyphosis.  Shoulder joints, elbow joints, wrist joints, MCPs, PIPs, and DIPs good ROM with no synovitis.  Complete fist formation bilaterally. PIP and DIP thickening consistent with osteoarthritis of both hands.  Hip joints good ROM.  Bilateral knee replacements good ROM with no warmth or effusion.  Ankle joints good ROM.  Tenderness over both ankles but no joint swelling.   CDAI Exam: CDAI Score: -- Patient Global: --; Provider Global: -- Swollen: --;  Tender: -- Joint Exam 08/08/2019   No joint exam has been documented for this visit   There is currently no information documented on the homunculus. Go to the Rheumatology activity and complete the homunculus joint exam.  Investigation: No additional findings.  Imaging: No results found.  Recent Labs: Lab Results  Component Value Date   WBC 7.8 08/17/2017   HGB 14.0 08/17/2017   PLT 209 08/17/2017   NA 139 08/17/2017   K 4.0 08/17/2017   CL 102 08/17/2017   CO2 29 08/17/2017   GLUCOSE 90 08/17/2017   BUN 13 08/17/2017   CREATININE 0.99 08/17/2017   BILITOT 0.5 08/17/2017   ALKPHOS 62 08/17/2017   AST 33 08/17/2017   ALT 33 08/17/2017   PROT 7.7 08/17/2017   ALBUMIN 4.5 08/17/2017   CALCIUM 9.8 08/17/2017   GFRAA >60 08/17/2017    Speciality Comments: No specialty comments available.  Procedures:  Trigger Point Inj  Date/Time: 08/08/2019 2:29 PM Performed by: 08/10/2019, MD Authorized by: Pollyann Savoy, MD  Consent Given by:  Patient Site marked: the procedure site was marked   Timeout: prior to procedure the correct patient, procedure, and site was verified   Indications:  Pain Total # of Trigger Points:  1 Location: neck   Location comment:  Right trapezius muscle Needle Size:  27 G Approach:  Dorsal Medications #1:  0.5 mL lidocaine 1 %; 10 mg triamcinolone acetonide 40 MG/ML Patient tolerance:  Patient tolerated the procedure well with no immediate complications   Allergies: Azithromycin and Codeine   Assessment / Plan:     Visit Diagnoses: Myofascial pain syndrome -She experiences intermittent myofascial pain.  She takes Robaxin 500 mg 1 tablet by mouth daily as needed and Flexeril 10 mg 1 tablet by mouth at bedtime for muscle spasms.  She presents today with trapezius muscle tension and muscle tenderness bilaterally.  She has been experiencing more frequent muscle spasms on the right side.  She requested trigger point injections today.   She tolerated the procedure well.  She plans on continue to use Voltaren gel topically as needed and tramadol 50 mg 1 tablet twice daily as needed for pain relief.  She has been trying to take tramadol very sparingly.  We discussed the importance of performing neck exercises.  We also discussed the importance of regular exercise and good sleep hygiene.  She has been going to water aerobics twice a week which she feels has been beneficial.  She will follow-up in the office in 6 months.   Trapezius muscle spasm: She presents today with trapezius muscle tension and muscle tenderness bilaterally.  She has been experiencing muscle spasms on the right side which have been severe.  She has limited range of motion with lateral rotation.  Good flexion-extension of the C-spine noted.  She takes Robaxin 500 mg 1 tablet as needed and Flexeril 10 mg by mouth at bedtime as needed for muscle spasms.  She requested trigger point injections today on the right side.  She tolerated the procedure.  The procedure note was completed above.  Aftercare was discussed. She is been experiencing increased neck pain and stiffness and has symptoms of right-sided radiculopathy.  Referral to Dr. Otelia Sergeant for further evaluation and management was placed today.  DDD (degenerative disc disease), cervical - She has limited ROM with lateral rotation.  She has good flexion and extension of the C-spine.  She has noticed increased neck pain and stiffness recently.  She has been performing stretching exercises daily.  She has started to have right sided radiculopathy, which is concerning her due to restarting her bowling league in 1 month.  A referral to Dr. Winifred Olive for further evaluation and treatment was placed today. She will continue taking tramadol sparingly for pain relief. Plan: Ambulatory referral to Orthopedic Surgery  DDD (degenerative disc disease), lumbar: She is not having any lower back pain at this time.    Status post total bilateral  knee replacement using cement: Chronic pain.  She has good ROM with no warmth or effusion.    Age-related osteoporosis without current pathological fracture -DEXA was ordered by PCP.  According to the patient she was advised to discontinue the use of Fosamax since she had been taking it for over 5 years.  She continues to take vitamin D 1000 units daily.  History of vitamin D deficiency: She takes vitamin D 1000 units daily.  Orders: Orders Placed This Encounter  Procedures  . Trigger Point Inj  . Ambulatory referral to Orthopedic Surgery   No  orders of the defined types were placed in this encounter.   Face-to-face time spent with patient was 30 minutes. Greater than 50% of time was spent in counseling and coordination of care.  Follow-Up Instructions: Return in about 6 months (around 02/08/2020) for Myofascial pain syndrome , DDD, Osteoporosis.   Sherron Ales, PA-C  I examined and evaluated the patient with Sherron Ales PA.  Patient continues to have generalized pain and discomfort.  As per her request I injected her right trapezius.  Stretching exercises were emphasized.  The plan of care was discussed as noted above.  Pollyann Savoy, MD  Note - This record has been created using Animal nutritionist.  Chart creation errors have been sought, but may not always  have been located. Such creation errors do not reflect on  the standard of medical care.

## 2019-08-08 ENCOUNTER — Other Ambulatory Visit: Payer: Self-pay

## 2019-08-08 ENCOUNTER — Encounter: Payer: Self-pay | Admitting: Rheumatology

## 2019-08-08 ENCOUNTER — Ambulatory Visit: Payer: Medicare Other | Admitting: Rheumatology

## 2019-08-08 VITALS — BP 143/78 | HR 105 | Ht 65.0 in | Wt 182.4 lb

## 2019-08-08 DIAGNOSIS — M810 Age-related osteoporosis without current pathological fracture: Secondary | ICD-10-CM

## 2019-08-08 DIAGNOSIS — M7918 Myalgia, other site: Secondary | ICD-10-CM

## 2019-08-08 DIAGNOSIS — M62838 Other muscle spasm: Secondary | ICD-10-CM | POA: Diagnosis not present

## 2019-08-08 DIAGNOSIS — M5136 Other intervertebral disc degeneration, lumbar region: Secondary | ICD-10-CM

## 2019-08-08 DIAGNOSIS — Z96653 Presence of artificial knee joint, bilateral: Secondary | ICD-10-CM

## 2019-08-08 DIAGNOSIS — Z8639 Personal history of other endocrine, nutritional and metabolic disease: Secondary | ICD-10-CM

## 2019-08-08 DIAGNOSIS — M5030 Other cervical disc degeneration, unspecified cervical region: Secondary | ICD-10-CM | POA: Diagnosis not present

## 2019-08-16 ENCOUNTER — Other Ambulatory Visit: Payer: Self-pay | Admitting: Physician Assistant

## 2019-08-16 NOTE — Telephone Encounter (Signed)
Last Visit: 08/08/2019 Next Visit: 02/07/2020  Last Fill: 07/18/2019  Okay to refill Methocarbamol?

## 2019-08-21 ENCOUNTER — Ambulatory Visit: Payer: Self-pay

## 2019-08-21 ENCOUNTER — Encounter: Payer: Self-pay | Admitting: Specialist

## 2019-08-21 ENCOUNTER — Ambulatory Visit: Payer: Medicare Other | Admitting: Specialist

## 2019-08-21 ENCOUNTER — Other Ambulatory Visit: Payer: Self-pay

## 2019-08-21 VITALS — BP 147/79 | HR 71 | Ht 65.0 in | Wt 183.0 lb

## 2019-08-21 DIAGNOSIS — M5030 Other cervical disc degeneration, unspecified cervical region: Secondary | ICD-10-CM | POA: Diagnosis not present

## 2019-08-21 DIAGNOSIS — M542 Cervicalgia: Secondary | ICD-10-CM

## 2019-08-21 DIAGNOSIS — M4722 Other spondylosis with radiculopathy, cervical region: Secondary | ICD-10-CM

## 2019-08-21 DIAGNOSIS — R200 Anesthesia of skin: Secondary | ICD-10-CM | POA: Diagnosis not present

## 2019-08-21 MED ORDER — MELOXICAM 15 MG PO TABS: 15.0000 mg | ORAL_TABLET | Freq: Every day | ORAL | 3 refills | Status: AC

## 2019-08-21 MED ORDER — MELOXICAM 15 MG PO TABS: 15.0000 mg | ORAL_TABLET | Freq: Every day | ORAL | 3 refills | Status: DC

## 2019-08-21 NOTE — Addendum Note (Signed)
Addended by: Vira Browns on: 08/21/2019 12:31 PM   Modules accepted: Orders

## 2019-08-21 NOTE — Patient Instructions (Addendum)
Plan: Avoid overhead lifting and overhead use of the arms. Do not lift greater than 5 lbs. Adjust head rest in vehicle to prevent hyperextension if rear ended. Take extra precautions to avoid falling. Hot showers in the AM and use of ice at the end of the day to relieve joint swelling. Biofreeze roll on applicator or local pain relieve. Plan: Knee is suffering from osteoarthritis, only real proven treatments are Hemp CBD capsules, amazon.com 5,000-7,000 mg per bottle, 60 capsules per bottle, take one capsule twice a day. Therapy at [facility] PT facility. EMGs/NCV to assess for cervical radiculopathy versus CTS  Follow-Up Instructions: No follow-ups on file.

## 2019-08-21 NOTE — Progress Notes (Addendum)
Office Visit Note   Patient: Meghan Alvarez           Date of Birth: 29-Mar-1934           MRN: [id number] Visit Date: 08/21/2019              Requested by: Gearldine Bienenstock, PA-C [address] [place],  [place] [address] PCP: Catha Gosselin, MD   Assessment & Plan: Visit Diagnoses:  1. Cervicalgia   2. Degenerative disc disease, cervical   3. Other spondylosis with radiculopathy, cervical region   4. Numbness of right hand     Plan: Avoid overhead lifting and overhead use of the arms. Do not lift greater than 5 lbs. Adjust head rest in vehicle to prevent hyperextension if rear ended. Take extra precautions to avoid falling. Hot showers in the AM and use of ice at the end of the day to relieve joint swelling. Biofreeze roll on applicator or local pain relieve. Plan: Knee is suffering from osteoarthritis, only real proven treatments are Hemp CBD capsules, amazon.com 5,000-7,000 mg per bottle, 60 capsules per bottle, take one capsule twice a day. Therapy at [facility] PT facility. EMGs/NCV to assess for cervical radiculopathy versus CTS.  Follow-Up Instructions: No follow-ups on file.   Orders:  Orders Placed This Encounter  Procedures   XR Cervical Spine 2 or 3 views   No orders of the defined types were placed in this encounter.     Procedures: No procedures performed   Clinical Data: No additional findings.   Subjective: Chief Complaint  Patient presents with   Neck - Follow-up    85 year old female right handed with history of right handed numbness that is worsening. Last week she had the hand go numb for almost a whole week. The whole hand becomes numb and this may happen day or night but especially when she does anything with the right hand. She has no Difficulty standing or walking. Her grip strength may be weaker and her husband is helping with opening bottles. There is no increase in clumbsiness. She has had previous surgery in 1991 and she  has had problems with her neck and shoulders every since then. She reports that she did tell Dr. Gerlene Fee about this and he sent her to [facility] and she was doing okay so she cancelled the appointment but she has been having pain every since. There is no grating but painful ROM turning the head side to side and with extension of the neck. She notices pain with water aerobic classes and ROM. Normal bowel or bladder difficulty.   Review of Systems  Constitutional: Positive for unexpected weight change (weight gain, told she has diabetes). Negative for activity change, appetite change, chills, diaphoresis, fatigue and fever.  HENT: Positive for hearing loss. Negative for congestion, dental problem, drooling, ear discharge, ear pain, facial swelling, mouth sores, nosebleeds, postnasal drip, rhinorrhea, sinus pressure, sinus pain, sneezing, sore throat, tinnitus, trouble swallowing and voice change.   Eyes: Positive for visual disturbance (has cataracts Dr. Garlan Fillers is here eye doctor.). Negative for photophobia, pain, discharge, redness and itching.  Respiratory: Positive for cough.   Cardiovascular: Negative.  Negative for chest pain (Occasional left chest discomfort.), palpitations and leg swelling.  Gastrointestinal: Negative.  Negative for abdominal distention, abdominal pain, anal bleeding, blood in stool, constipation, diarrhea, nausea, rectal pain and vomiting.  Endocrine: Negative.   Genitourinary: Negative.   Musculoskeletal: Positive for back pain, neck pain and  neck stiffness. Negative for arthralgias, gait problem and joint swelling.  Skin: Negative.  Negative for color change, pallor, rash and wound.  Allergic/Immunologic: Negative.   Neurological: Positive for dizziness, tremors, seizures, syncope, facial asymmetry, speech difficulty, weakness, light-headedness and headaches. Negative for numbness.  Hematological: Negative.  Negative for adenopathy. Does not bruise/bleed easily.    Psychiatric/Behavioral: Negative.      Objective: Vital Signs: BP (!) 147/79 (BP Location: Left Arm, Patient Position: Sitting)    Pulse 71    Ht 5\' 5"  (1.651 m)    Wt 183 lb (83 kg)    BMI 30.45 kg/m   Physical Exam Constitutional:      Appearance: She is well-developed.  HENT:     Head: Normocephalic and atraumatic.  Eyes:     Pupils: Pupils are equal, round, and reactive to light.  Pulmonary:     Effort: Pulmonary effort is normal.     Breath sounds: Normal breath sounds.  Abdominal:     General: Bowel sounds are normal.     Palpations: Abdomen is soft.  Musculoskeletal:     Cervical back: Normal range of motion and neck supple.     Lumbar back: Negative right straight leg raise test and negative left straight leg raise test.  Skin:    General: Skin is warm and dry.  Neurological:     Mental Status: She is alert and oriented to person, place, and time.  Psychiatric:        Behavior: Behavior normal.        Thought Content: Thought content normal.        Judgment: Judgment normal.     Back Exam   Tenderness  The patient is experiencing tenderness in the cervical.  Range of Motion  Extension:  50 abnormal  Flexion:  80 normal  Lateral bend right: 80  Lateral bend left: 70  Rotation right: 60  Rotation left: 60   Muscle Strength  The patient has normal back strength. Right Quadriceps:  5/5  Left Quadriceps:  5/5  Right Hamstrings:  5/5  Left Hamstrings:  5/5   Tests  Straight leg raise right: negative Straight leg raise left: negative  Reflexes  Patellar: normal Achilles: normal Biceps: 2/4 Babinski's sign: normal   Other  Toe walk: normal Heel walk: normal Sensation: normal Gait: normal  Erythema: no back redness Scars: absent      Specialty Comments:  No specialty comments available.  Imaging: XR Cervical Spine 2 or 3 views  Result Date: 08/21/2019 AP and lateral flexion and extension radiographs of the cervical spine demonstrate A  solid interbody fusion C4-5, DDD C5-6 and C6-7 with anterolisthesis at both C3-4 and C7-T1 likely compensatory due to stiffness at the C4 to C7 levels. Bilateral uncovertebral hypertrophy is present C5-6 and C6-7. Reversal of the normal lordotic curve.    PMFS History: Patient Active Problem List   Diagnosis Date Noted   Myofascial pain syndrome 02/04/2016   Spondylosis of lumbar region without myelopathy or radiculopathy 02/04/2016   DJD (degenerative joint disease), cervical 02/04/2016   Age-related osteoporosis without current pathological fracture 02/04/2016   Vitamin D deficiency 02/04/2016   S/P TKR (total knee replacement) using cement 03/30/2014   Past Medical History:  Diagnosis Date   Arthritis    Complication of anesthesia    Diabetes mellitus without complication (HCC)    Fibromyalgia    PONV (postoperative nausea and vomiting)     Family History  Problem Relation Age of Onset  Cancer Sister        breast   Diabetes Brother     Past Surgical History:  Procedure Laterality Date   APPENDECTOMY  04   ruptured    CERVICAL DISCECTOMY  63   DILATION AND CURETTAGE OF UTERUS  05   FRACTURE SURGERY Left 06   arm   JOINT REPLACEMENT Left 08   knee repl   SHOULDER ARTHROSCOPY Right 2001   SHOULDER ARTHROSCOPY Left 07   TONSILLECTOMY     TOTAL KNEE ARTHROPLASTY Right 03/30/2014   DR NORRIS   TOTAL KNEE ARTHROPLASTY Right 03/30/2014   Procedure: RIGHT TOTAL KNEE ARTHROPLASTY;  Surgeon: Beverely Low, MD;  Location: [facility] OR;  Service: Orthopedics;  Laterality: Right;   Social History   Occupational History   Not on file  Tobacco Use   Smoking status: Never Smoker   Smokeless tobacco: Never Used  Vaping Use   Vaping Use: Never used  Substance and Sexual Activity   Alcohol use: Yes    Comment: RARELY   Drug use: Never   Sexual activity: Not on file

## 2019-08-28 ENCOUNTER — Telehealth: Payer: Self-pay | Admitting: Specialist

## 2019-08-28 NOTE — Telephone Encounter (Signed)
Patient called.   She is requesting a call back to clarify the instructions for her medication  Call back: [phone]

## 2019-08-28 NOTE — Telephone Encounter (Signed)
Patient was questioning the meloxicam I advised she should take 1 after breakfast each day, and she can get the CBD capsule and that one would be 2 times a day, she states that she understands now.

## 2019-08-29 ENCOUNTER — Encounter: Payer: Self-pay | Admitting: Physical Therapy

## 2019-08-29 ENCOUNTER — Other Ambulatory Visit: Payer: Self-pay

## 2019-08-29 ENCOUNTER — Ambulatory Visit: Payer: Medicare Other | Attending: Specialist | Admitting: Physical Therapy

## 2019-08-29 DIAGNOSIS — M542 Cervicalgia: Secondary | ICD-10-CM

## 2019-08-29 DIAGNOSIS — M5412 Radiculopathy, cervical region: Secondary | ICD-10-CM

## 2019-08-29 DIAGNOSIS — M6281 Muscle weakness (generalized): Secondary | ICD-10-CM

## 2019-08-29 NOTE — Patient Instructions (Signed)
Access Code: URL: [email or web address] Date: 08/29/2019 Prepared by: Lysle Rubens  Exercises Seated Scapular Retraction - 1 x daily - 7 x weekly - 3 sets - 10 reps - 3 sec hold Seated Cervical Retraction - 1 x daily - 7 x weekly - 3 sets - 10 reps - 3 sec hold Seated Gentle Upper Trapezius Stretch - 1 x daily - 7 x weekly - 3 sets - 2 reps - 20-30 sec hold Seated Levator Scapulae Stretch - 1 x daily - 7 x weekly - 3 sets - 2 reps - 20-30 sec hold

## 2019-08-29 NOTE — Therapy (Signed)
[facility]- [place] [address] [place], [place], [address] Phone: [phone]   Fax:  [phone]  Physical Therapy Evaluation  Patient Details  Name: Meghan Alvarez MRN: [id number] Date of Birth: 07/01/1934 Referring Provider (Meghan Alvarez): Otelia Sergeant   Encounter Date: 08/29/2019   Meghan Alvarez End of Session - 08/29/19 1027    Visit Number 1    Date for Meghan Alvarez Re-Evaluation 10/29/19    Meghan Alvarez Start Time 0931    Meghan Alvarez Stop Time 1010    Meghan Alvarez Time Calculation (min) 39 min    Activity Tolerance Patient tolerated treatment well    Behavior During Therapy [facility] for tasks assessed/performed           Past Medical History:  Diagnosis Date  . Arthritis   . Complication of anesthesia   . Diabetes mellitus without complication (HCC)   . Fibromyalgia   . PONV (postoperative nausea and vomiting)     Past Surgical History:  Procedure Laterality Date  . APPENDECTOMY  04   ruptured   . CERVICAL DISCECTOMY  91  . DILATION AND CURETTAGE OF UTERUS  05  . FRACTURE SURGERY Left 06   arm  . JOINT REPLACEMENT Left 08   knee repl  . SHOULDER ARTHROSCOPY Right 2001  . SHOULDER ARTHROSCOPY Left 07  . TONSILLECTOMY    . TOTAL KNEE ARTHROPLASTY Right 03/30/2014   DR NORRIS  . TOTAL KNEE ARTHROPLASTY Right 03/30/2014   Procedure: RIGHT TOTAL KNEE ARTHROPLASTY;  Surgeon: Beverely Low, MD;  Location: [facility] OR;  Service: Orthopedics;  Laterality: Right;    There were no vitals filed for this visit.    Subjective Assessment - 08/29/19 0934    Subjective Meghan Alvarez reports that she has been experiencing neck pain with intermittent N/T in R hand for years but worsening over the past couple of months. Meghan Alvarez states that she has been having shoulder and upper back pain with shoulder feeling very tight. Meghan Alvarez states that she does water aerobics twice a week which helps some.    Limitations House hold activities    Diagnostic tests cervical xrays    Patient Stated Goals reduce pain and reduce  N/T in hand    Currently in Pain? Yes    Pain Score 5     Pain Location Neck    Pain Orientation Right    Pain Descriptors / Indicators Dull;Numbness;Tingling    Pain Type Chronic pain    Pain Radiating Towards R UE; reports entire R hand goes numb intermittently    Pain Onset More than a month ago    Pain Frequency Intermittent    Aggravating Factors  extending neck, prolonged sitting, reaching overhead    Pain Relieving Factors moving around, holding hand up relieves numbness              [facility] Meghan Alvarez Assessment - 08/29/19 0001      Assessment   Medical Diagnosis Cervicalgia    Referring Provider (Meghan Alvarez) Otelia Sergeant    Next MD Visit 09/29/2019      Precautions   Precautions None      Restrictions   Weight Bearing Restrictions No      Balance Screen   Has the patient fallen in the past 6 months No    Has the patient had a decrease in activity level because of a fear of falling?  No    Is the patient reluctant to leave their home because of a fear of falling?  No  Home Environment   Additional Comments cooking, cleaning; no stairs at home      Prior Function   Level of Independence Independent    Vocation Retired    Leisure Lexicographer Appears Intact      Posture/Postural Control   Posture/Postural Control Postural limitations    Postural Limitations Rounded Shoulders;Forward head      ROM / Strength   AROM / PROM / Strength AROM;Strength      AROM   AROM Assessment Site Cervical    Cervical Flexion WFL    Cervical Extension 30    Cervical - Right Side Bend 20    Cervical - Left Side Bend 20    Cervical - Right Rotation 60    Cervical - Left Rotation 55      Strength   Overall Strength Comments BUE strength 4+/5    Strength Assessment Site Hand    Right/Left hand Right;Left    Right Hand Grip (lbs) 55    Left Hand Grip (lbs) 60      Palpation   Palpation comment tender to palpation B UT and suboccipitals R>L      Special  Tests    Special Tests Cervical    Cervical Tests Dictraction;Spurling's      Spurling's   Findings Negative    Side Right      Distraction Test   Findngs Positive    side Right    Comment slight relief reported with manual distraction                      Objective measurements completed on examination: See above findings.       [facility] Adult Meghan Alvarez Treatment/Exercise - 08/29/19 0001      Exercises   Exercises Neck      Neck Exercises: Theraband   Scapula Retraction 10 reps      Neck Exercises: Standing   Neck Retraction 10 reps;3 secs      Neck Exercises: Stretches   Upper Trapezius Stretch Right;Left;1 rep;30 seconds    Levator Stretch Right;Left;1 rep;30 seconds                  Meghan Alvarez Education - 08/29/19 1027    Education Details Meghan Alvarez educated on POC and HEP    Person(s) Educated Patient    Methods Explanation;Demonstration;Handout    Comprehension Verbalized understanding;Returned demonstration            Meghan Alvarez Short Term Goals - 08/29/19 1109      Meghan Alvarez SHORT TERM GOAL #1   Title Meghan Alvarez will be independent with HEP    Time 2    Period Weeks    Status New    Target Date 09/12/19             Meghan Alvarez Long Term Goals - 08/29/19 1109      Meghan Alvarez LONG TERM GOAL #1   Title Meghan Alvarez will demo cervical ROM WFL and without increase in cervical pain    Time 6    Period Weeks    Status New    Target Date 10/10/19      Meghan Alvarez LONG TERM GOAL #2   Title Meghan Alvarez will report resolution of N/T in RUE    Time 6    Period Weeks    Status New    Target Date 10/10/19      Meghan Alvarez LONG TERM GOAL #3   Title Meghan Alvarez  will report 50% reduction in cervical pain    Time 6    Period Weeks    Status New    Target Date 10/10/19                  Plan - 08/29/19 1042    Clinical Impression Statement Meghan Alvarez presents to clinic with reports of chronic cervical pain with radiating RUE pain present for years and worsening over the last few months. Meghan Alvarez states that neck pain began in 1991  following a neck surgery and she has experienced intermittent pain ever since. Meghan Alvarez demos mild decrease in R grip strength as compared to L, cervical ROM deficits, tenderness to palpation in B UT/suboccipitals, and N/T in R UE. Meghan Alvarez would like to return to bowling league at the end of August. Meghan Alvarez would benefit from skilled Meghan Alvarez to address the above impairments.    Personal Factors and Comorbidities Time since onset of injury/illness/exacerbation;Age    Examination-Activity Limitations Lift;Reach Overhead;Carry    Examination-Participation Restrictions Community Activity;Interpersonal Relationship    Stability/Clinical Decision Making Evolving/Moderate complexity    Clinical Decision Making Low    Rehab Potential Good    Meghan Alvarez Frequency 2x / week    Meghan Alvarez Duration 8 weeks    Meghan Alvarez Treatment/Interventions ADLs/Self Care Home Management;Electrical Stimulation;Traction;Moist Heat;Therapeutic activities;Therapeutic exercise;Neuromuscular re-education;Manual techniques;Patient/family education;Passive range of motion;Dry needling    Meghan Alvarez Next Visit Plan cervical ROM/flexibility, scap stab, cervical traction if indicated    Meghan Alvarez Home Exercise Plan scap retraction, neck retraction, UT stretch, levator stretch    Consulted and Agree with Plan of Care Patient           Patient will benefit from skilled therapeutic intervention in order to improve the following deficits and impairments:  Decreased range of motion, Increased muscle spasms, Impaired UE functional use, Pain, Impaired flexibility, Postural dysfunction, Decreased strength  Visit Diagnosis: Neck pain  Muscle weakness (generalized)  Radiculopathy, cervical region     Problem List Patient Active Problem List   Diagnosis Date Noted  . Myofascial pain syndrome 02/04/2016  . Spondylosis of lumbar region without myelopathy or radiculopathy 02/04/2016  . DJD (degenerative joint disease), cervical 02/04/2016  . Age-related osteoporosis without current  pathological fracture 02/04/2016  . Vitamin D deficiency 02/04/2016  . S/P TKR (total knee replacement) using cement 03/30/2014   Meghan Alvarez, Meghan Alvarez, Meghan Alvarez Meghan Alvarez 08/29/2019, 11:11 AM  [facility]- [place] [address] [place], [place], [address] Phone: [phone]   Fax:  [phone]  Name: Meghan Alvarez MRN: [id number] Date of Birth: [birth year]

## 2019-09-06 ENCOUNTER — Encounter: Payer: Medicare Other | Admitting: Physical Therapy

## 2019-09-08 ENCOUNTER — Encounter: Payer: Self-pay | Admitting: Physical Medicine and Rehabilitation

## 2019-09-08 ENCOUNTER — Other Ambulatory Visit: Payer: Self-pay

## 2019-09-08 ENCOUNTER — Ambulatory Visit: Payer: Medicare Other | Admitting: Physical Medicine and Rehabilitation

## 2019-09-08 DIAGNOSIS — R202 Paresthesia of skin: Secondary | ICD-10-CM | POA: Diagnosis not present

## 2019-09-08 NOTE — Progress Notes (Signed)
Right arm numbness. Unable to sleep on right side. Had constant numbness for about 2 weeks, but this usually comes and goes.  Right hand dominant Wearing lotion Numeric Pain Rating Scale and Functional Assessment Average Pain 2   In the last MONTH (on 0-10 scale) has pain interfered with the following?  1. General activity like being  able to carry out your everyday physical activities such as walking, climbing stairs, carrying groceries, or moving a chair?  Rating(5)

## 2019-09-12 NOTE — Progress Notes (Signed)
Meghan Alvarez - 85 y.o. female MRN [id number]  Date of birth: 12-09-1934  Office Visit Note: Visit Date: 09/08/2019 PCP: Catha Gosselin, MD Referred by: Catha Gosselin, MD  Subjective: Chief Complaint  Patient presents with  . Right Hand - Pain   HPI: Meghan Alvarez is a 85 y.o. female who comes in today at the request of Dr. Vira Browns for electrodiagnostic study of the Right upper extremities.  Patient is Right hand dominant.  Patient reports intermittent history of right hand pain and numbness and tingling.  Meghan Alvarez reports several weeks now though of progressive numbness that just will not go away.  Meghan Alvarez reports that it is difficult to sleep at night because of the symptoms in the right hand.  Meghan Alvarez reports more of the symptoms over the radial digits but it can feel like the whole hand.  It does radiate up the arm.  No frank radicular symptoms down the arms.  Meghan Alvarez does not have a history of diabetes or prior electrodiagnostic study.  Meghan Alvarez does have a history of chronic pain syndrome.  ROS Otherwise per HPI.  Assessment & Plan: Visit Diagnoses:  1. Paresthesia of skin     Plan: Impression: The above electrodiagnostic study is ABNORMAL and reveals evidence of a moderate right median nerve entrapment at the wrist (carpal tunnel syndrome) affecting sensory and motor components.   There is no significant electrodiagnostic evidence of any other focal nerve entrapment, brachial plexopathy or cervical radiculopathy.   Recommendations: 1.  Follow-up with referring physician. 2.  Continue current management of symptoms. 3.  Continue use of resting splint at night-time and as needed during the day. 4.  Suggest surgical evaluation.  Meds & Orders: No orders of the defined types were placed in this encounter.   Orders Placed This Encounter  Procedures  . NCV with EMG (electromyography)    Follow-up: Return for Vira Browns, MD as scheduled.   Procedures: No procedures performed  EMG & NCV  Findings: Evaluation of the right median motor nerve showed prolonged distal onset latency (4.7 ms) and decreased conduction velocity (Elbow-Wrist, 47 m/s).  The right median (across palm) sensory nerve showed prolonged distal peak latency (Wrist, 4.9 ms) and prolonged distal peak latency (Palm, 2.1 ms).  All remaining nerves (as indicated in the following tables) were within normal limits.    All examined muscles (as indicated in the following table) showed no evidence of electrical instability.    Impression: The above electrodiagnostic study is ABNORMAL and reveals evidence of a moderate right median nerve entrapment at the wrist (carpal tunnel syndrome) affecting sensory and motor components.   There is no significant electrodiagnostic evidence of any other focal nerve entrapment, brachial plexopathy or cervical radiculopathy.   Recommendations: 1.  Follow-up with referring physician. 2.  Continue current management of symptoms. 3.  Continue use of resting splint at night-time and as needed during the day. 4.  Suggest surgical evaluation.  ___________________________ Naaman Plummer FAAPMR Board Certified, American Board of Physical Medicine and Rehabilitation    Nerve Conduction Studies Anti Sensory Summary Table   Stim Site NR Peak (ms) Norm Peak (ms) P-T Amp (V) Norm P-T Amp Site1 Site2 Delta-P (ms) Dist (cm) Vel (m/s) Norm Vel (m/s)  Right Median Acr Palm Anti Sensory (2nd Digit)  30.8C  Wrist    *4.9 <3.6 10.4 >10 Wrist Palm 2.8 0.0    Palm    *2.1 <2.0 13.5         Right Radial  Anti Sensory (Base 1st Digit)  31.3C  Wrist    2.3 <3.1 24.0  Wrist Base 1st Digit 2.3 0.0    Right Ulnar Anti Sensory (5th Digit)  31.3C  Wrist    3.5 <3.7 19.8 >15.0 Wrist 5th Digit 3.5 14.0 40 >38   Motor Summary Table   Stim Site NR Onset (ms) Norm Onset (ms) O-P Amp (mV) Norm O-P Amp Site1 Site2 Delta-0 (ms) Dist (cm) Vel (m/s) Norm Vel (m/s)  Right Median Motor (Abd Poll Brev)  32C  Wrist     *4.7 <4.2 6.9 >5 Elbow Wrist 4.5 21.0 *47 >50  Elbow    9.2  5.6         Right Ulnar Motor (Abd Dig Min)  32.4C  Wrist    3.1 <4.2 7.8 >3 B Elbow Wrist 3.6 19.5 54 >53  B Elbow    6.7  7.3  A Elbow B Elbow 1.5 10.0 67 >53  A Elbow    8.2  7.0          EMG   Side Muscle Nerve Root Ins Act Fibs Psw Amp Dur Poly Recrt Int Dennie Bible Comment  Right Abd Poll Brev Median C8-T1 Nml Nml Nml Nml Nml 0 Nml Nml   Right 1stDorInt Ulnar C8-T1 Nml Nml Nml Nml Nml 0 Nml Nml   Right PronatorTeres Median C6-7 Nml Nml Nml Nml Nml 0 Nml Nml   Right Biceps Musculocut C5-6 Nml Nml Nml Nml Nml 0 Nml Nml   Right Deltoid Axillary C5-6 Nml Nml Nml Nml Nml 0 Nml Nml     Nerve Conduction Studies Anti Sensory Left/Right Comparison   Stim Site L Lat (ms) R Lat (ms) L-R Lat (ms) L Amp (V) R Amp (V) L-R Amp (%) Site1 Site2 L Vel (m/s) R Vel (m/s) L-R Vel (m/s)  Median Acr Palm Anti Sensory (2nd Digit)  30.8C  Wrist  *4.9   10.4  Wrist Palm     Palm  *2.1   13.5        Radial Anti Sensory (Base 1st Digit)  31.3C  Wrist  2.3   24.0  Wrist Base 1st Digit     Ulnar Anti Sensory (5th Digit)  31.3C  Wrist  3.5   19.8  Wrist 5th Digit  40    Motor Left/Right Comparison   Stim Site L Lat (ms) R Lat (ms) L-R Lat (ms) L Amp (mV) R Amp (mV) L-R Amp (%) Site1 Site2 L Vel (m/s) R Vel (m/s) L-R Vel (m/s)  Median Motor (Abd Poll Brev)  32C  Wrist  *4.7   6.9  Elbow Wrist  *47   Elbow  9.2   5.6        Ulnar Motor (Abd Dig Min)  32.4C  Wrist  3.1   7.8  B Elbow Wrist  54   B Elbow  6.7   7.3  A Elbow B Elbow  67   A Elbow  8.2   7.0           Waveforms:             Clinical History: No specialty comments available.   Meghan Alvarez reports that Meghan Alvarez has never smoked. Meghan Alvarez has never used smokeless tobacco. No results for input(s): HGBA1C, LABURIC in the last 8760 hours.  Objective:  VS:  HT:    WT:   BMI:     BP:   HR: bpm  TEMP: ( )  RESP:  Physical  Exam Vitals and nursing note reviewed.  Constitutional:       General: Meghan Alvarez is not in acute distress.    Appearance: Normal appearance. Meghan Alvarez is not ill-appearing.  HENT:     Head: Normocephalic and atraumatic.     Right Ear: External ear normal.     Left Ear: External ear normal.  Eyes:     Extraocular Movements: Extraocular movements intact.  Cardiovascular:     Rate and Rhythm: Normal rate.     Pulses: Normal pulses.  Musculoskeletal:        General: No swelling or deformity.     Cervical back: Tenderness present. No rigidity.     Right lower leg: No edema.     Left lower leg: No edema.     Comments: Inspection reveals no atrophy of the bilateral APB or FDI or hand intrinsics. There is no swelling, color changes, allodynia or dystrophic changes. There is 5 out of 5 strength in the bilateral wrist extension, finger abduction and long finger flexion. There is intact sensation to light touch in all dermatomal and peripheral nerve distributions.  There is a positive Phalen's test on the right. There is a negative Hoffmann's test bilaterally.  Lymphadenopathy:     Cervical: No cervical adenopathy.  Skin:    General: Skin is warm and dry.     Findings: No erythema, lesion or rash.  Neurological:     General: No focal deficit present.     Mental Status: Meghan Alvarez is alert and oriented to person, place, and time.     Sensory: No sensory deficit.     Motor: No weakness or abnormal muscle tone.     Coordination: Coordination normal.  Psychiatric:        Mood and Affect: Mood normal.        Behavior: Behavior normal.     Ortho Exam  Imaging: No results found.  Past Medical/Family/Surgical/Social History: Medications & Allergies reviewed per EMR, new medications updated. Patient Active Problem List   Diagnosis Date Noted  . Myofascial pain syndrome 02/04/2016  . Spondylosis of lumbar region without myelopathy or radiculopathy 02/04/2016  . DJD (degenerative joint disease), cervical 02/04/2016  . Age-related osteoporosis without current pathological  fracture 02/04/2016  . Vitamin D deficiency 02/04/2016  . S/P TKR (total knee replacement) using cement 03/30/2014   Past Medical History:  Diagnosis Date  . Arthritis   . Complication of anesthesia   . Diabetes mellitus without complication (HCC)   . Fibromyalgia   . PONV (postoperative nausea and vomiting)    Family History  Problem Relation Age of Onset  . Cancer Sister        breast  . Diabetes Brother    Past Surgical History:  Procedure Laterality Date  . APPENDECTOMY  04   ruptured   . CERVICAL DISCECTOMY  91  . DILATION AND CURETTAGE OF UTERUS  05  . FRACTURE SURGERY Left 06   arm  . JOINT REPLACEMENT Left 08   knee repl  . SHOULDER ARTHROSCOPY Right 2001  . SHOULDER ARTHROSCOPY Left 07  . TONSILLECTOMY    . TOTAL KNEE ARTHROPLASTY Right 03/30/2014   DR NORRIS  . TOTAL KNEE ARTHROPLASTY Right 03/30/2014   Procedure: RIGHT TOTAL KNEE ARTHROPLASTY;  Surgeon: Beverely Low, MD;  Location: [facility] OR;  Service: Orthopedics;  Laterality: Right;   Social History   Occupational History  . Not on file  Tobacco Use  . Smoking status: Never Smoker  . Smokeless tobacco: Never  Used  Vaping Use  . Vaping Use: Never used  Substance and Sexual Activity  . Alcohol use: Yes    Comment: RARELY  . Drug use: Never  . Sexual activity: Not on file

## 2019-09-12 NOTE — Procedures (Signed)
EMG & NCV Findings: Evaluation of the right median motor nerve showed prolonged distal onset latency (4.7 ms) and decreased conduction velocity (Elbow-Wrist, 47 m/s).  The right median (across palm) sensory nerve showed prolonged distal peak latency (Wrist, 4.9 ms) and prolonged distal peak latency (Palm, 2.1 ms).  All remaining nerves (as indicated in the following tables) were within normal limits.    All examined muscles (as indicated in the following table) showed no evidence of electrical instability.    Impression: The above electrodiagnostic study is ABNORMAL and reveals evidence of a moderate right median nerve entrapment at the wrist (carpal tunnel syndrome) affecting sensory and motor components.   There is no significant electrodiagnostic evidence of any other focal nerve entrapment, brachial plexopathy or cervical radiculopathy.   Recommendations: 1.  Follow-up with referring physician. 2.  Continue current management of symptoms. 3.  Continue use of resting splint at night-time and as needed during the day. 4.  Suggest surgical evaluation.  ___________________________ Naaman Plummer FAAPMR Board Certified, American Board of Physical Medicine and Rehabilitation    Nerve Conduction Studies Anti Sensory Summary Table   Stim Site NR Peak (ms) Norm Peak (ms) P-T Amp (V) Norm P-T Amp Site1 Site2 Delta-P (ms) Dist (cm) Vel (m/s) Norm Vel (m/s)  Right Median Acr Palm Anti Sensory (2nd Digit)  30.8C  Wrist    *4.9 <3.6 10.4 >10 Wrist Palm 2.8 0.0    Palm    *2.1 <2.0 13.5         Right Radial Anti Sensory (Base 1st Digit)  31.3C  Wrist    2.3 <3.1 24.0  Wrist Base 1st Digit 2.3 0.0    Right Ulnar Anti Sensory (5th Digit)  31.3C  Wrist    3.5 <3.7 19.8 >15.0 Wrist 5th Digit 3.5 14.0 40 >38   Motor Summary Table   Stim Site NR Onset (ms) Norm Onset (ms) O-P Amp (mV) Norm O-P Amp Site1 Site2 Delta-0 (ms) Dist (cm) Vel (m/s) Norm Vel (m/s)  Right Median Motor (Abd Poll Brev)   32C  Wrist    *4.7 <4.2 6.9 >5 Elbow Wrist 4.5 21.0 *47 >50  Elbow    9.2  5.6         Right Ulnar Motor (Abd Dig Min)  32.4C  Wrist    3.1 <4.2 7.8 >3 B Elbow Wrist 3.6 19.5 54 >53  B Elbow    6.7  7.3  A Elbow B Elbow 1.5 10.0 67 >53  A Elbow    8.2  7.0          EMG   Side Muscle Nerve Root Ins Act Fibs Psw Amp Dur Poly Recrt Int Dennie Bible Comment  Right Abd Poll Brev Median C8-T1 Nml Nml Nml Nml Nml 0 Nml Nml   Right 1stDorInt Ulnar C8-T1 Nml Nml Nml Nml Nml 0 Nml Nml   Right PronatorTeres Median C6-7 Nml Nml Nml Nml Nml 0 Nml Nml   Right Biceps Musculocut C5-6 Nml Nml Nml Nml Nml 0 Nml Nml   Right Deltoid Axillary C5-6 Nml Nml Nml Nml Nml 0 Nml Nml     Nerve Conduction Studies Anti Sensory Left/Right Comparison   Stim Site L Lat (ms) R Lat (ms) L-R Lat (ms) L Amp (V) R Amp (V) L-R Amp (%) Site1 Site2 L Vel (m/s) R Vel (m/s) L-R Vel (m/s)  Median Acr Palm Anti Sensory (2nd Digit)  30.8C  Wrist  *4.9   10.4  Wrist Palm  Palm  *2.1   13.5        Radial Anti Sensory (Base 1st Digit)  31.3C  Wrist  2.3   24.0  Wrist Base 1st Digit     Ulnar Anti Sensory (5th Digit)  31.3C  Wrist  3.5   19.8  Wrist 5th Digit  40    Motor Left/Right Comparison   Stim Site L Lat (ms) R Lat (ms) L-R Lat (ms) L Amp (mV) R Amp (mV) L-R Amp (%) Site1 Site2 L Vel (m/s) R Vel (m/s) L-R Vel (m/s)  Median Motor (Abd Poll Brev)  32C  Wrist  *4.7   6.9  Elbow Wrist  *47   Elbow  9.2   5.6        Ulnar Motor (Abd Dig Min)  32.4C  Wrist  3.1   7.8  B Elbow Wrist  54   B Elbow  6.7   7.3  A Elbow B Elbow  67   A Elbow  8.2   7.0           Waveforms:

## 2019-09-13 ENCOUNTER — Other Ambulatory Visit: Payer: Self-pay

## 2019-09-13 ENCOUNTER — Encounter: Payer: Self-pay | Admitting: Physical Therapy

## 2019-09-13 ENCOUNTER — Ambulatory Visit: Payer: Medicare Other | Attending: Specialist | Admitting: Physical Therapy

## 2019-09-13 DIAGNOSIS — M5412 Radiculopathy, cervical region: Secondary | ICD-10-CM | POA: Insufficient documentation

## 2019-09-13 DIAGNOSIS — M542 Cervicalgia: Secondary | ICD-10-CM

## 2019-09-13 DIAGNOSIS — M6281 Muscle weakness (generalized): Secondary | ICD-10-CM | POA: Diagnosis present

## 2019-09-13 NOTE — Therapy (Signed)
[facility] [place] [address] [place], [place], [address] Phone: [phone]   Fax:  [phone]  Physical Therapy Treatment  Patient Details  Name: Meghan Alvarez MRN: [id number] Date of Birth: 19-Jul-1934 Referring Provider (PT): Otelia Sergeant   Encounter Date: 09/13/2019   PT End of Session - 09/13/19 1605    Visit Number 2    Date for PT Re-Evaluation 10/29/19    PT Start Time 1531    PT Stop Time 1617    PT Time Calculation (min) 46 min    Activity Tolerance Patient tolerated treatment well    Behavior During Therapy [facility] for tasks assessed/performed           Past Medical History:  Diagnosis Date  . Arthritis   . Complication of anesthesia   . Diabetes mellitus without complication (HCC)   . Fibromyalgia   . PONV (postoperative nausea and vomiting)     Past Surgical History:  Procedure Laterality Date  . APPENDECTOMY  04   ruptured   . CERVICAL DISCECTOMY  91  . DILATION AND CURETTAGE OF UTERUS  05  . FRACTURE SURGERY Left 06   arm  . JOINT REPLACEMENT Left 08   knee repl  . SHOULDER ARTHROSCOPY Right 2001  . SHOULDER ARTHROSCOPY Left 07  . TONSILLECTOMY    . TOTAL KNEE ARTHROPLASTY Right 03/30/2014   DR NORRIS  . TOTAL KNEE ARTHROPLASTY Right 03/30/2014   Procedure: RIGHT TOTAL KNEE ARTHROPLASTY;  Surgeon: Beverely Low, MD;  Location: [facility] OR;  Service: Orthopedics;  Laterality: Right;    There were no vitals filed for this visit.   Subjective Assessment - 09/13/19 1534    Subjective Pt reports that neck pain is slowly getting better but that she has had a lot of pain over the past few weeks. States this is why she has not been doing exercises at home.    Currently in Pain? Yes    Pain Score 4     Pain Location Neck    Pain Orientation Right                             [facility] Adult PT Treatment/Exercise - 09/13/19 0001      Neck Exercises: Machines for Strengthening   UBE (Upper Arm  Bike) L3 3 min fwd/3 min bkwd      Neck Exercises: Theraband   Shoulder Extension 20 reps;Green    Rows 20 reps;Green      Neck Exercises: Seated   Neck Retraction 20 reps;3 secs   into ball     Modalities   Modalities Moist Heat;Electrical Stimulation      Moist Heat Therapy   Number Minutes Moist Heat 12 Minutes    Moist Heat Location Cervical;Lumbar Spine      Electrical Stimulation   Electrical Stimulation Location Cervical/Thoracic    Electrical Stimulation Action IFC    Electrical Stimulation Parameters sitting    Electrical Stimulation Goals Pain      Manual Therapy   Manual Therapy Soft tissue mobilization    Soft tissue mobilization to thoracic paraspinals, UT, and suboccipitals B                    PT Short Term Goals - 09/13/19 1607      PT SHORT TERM GOAL #1   Title Pt will be independent with HEP    Time 2  Period Weeks    Status On-going    Target Date 09/12/19             PT Long Term Goals - 08/29/19 1109      PT LONG TERM GOAL #1   Title Pt will demo cervical ROM WFL and without increase in cervical pain    Time 6    Period Weeks    Status New    Target Date 10/10/19      PT LONG TERM GOAL #2   Title Pt will report resolution of N/T in RUE    Time 6    Period Weeks    Status New    Target Date 10/10/19      PT LONG TERM GOAL #3   Title Pt will report 50% reduction in cervical pain    Time 6    Period Weeks    Status New    Target Date 10/10/19                 Plan - 09/13/19 1605    Clinical Impression Statement Pt tolerated progression to TE well with no increased cervical pain. Pt did report mild increase in thoracic pain with going backwards on UBE. Pt required cuing for form with all ex's. Pt reporting tenderness in upper thoracic area; states relief with STM to this area. Pt able to go bowling today with no increase in pain.    PT Treatment/Interventions ADLs/Self Care Home Management;Electrical  Stimulation;Traction;Moist Heat;Therapeutic activities;Therapeutic exercise;Neuromuscular re-education;Manual techniques;Patient/family education;Passive range of motion;Dry needling    PT Next Visit Plan cervical ROM/flexibility, scap stab    Consulted and Agree with Plan of Care Patient           Patient will benefit from skilled therapeutic intervention in order to improve the following deficits and impairments:  Decreased range of motion, Increased muscle spasms, Impaired UE functional use, Pain, Impaired flexibility, Postural dysfunction, Decreased strength  Visit Diagnosis: Neck pain  Muscle weakness (generalized)  Radiculopathy, cervical region     Problem List Patient Active Problem List   Diagnosis Date Noted  . Myofascial pain syndrome 02/04/2016  . Spondylosis of lumbar region without myelopathy or radiculopathy 02/04/2016  . DJD (degenerative joint disease), cervical 02/04/2016  . Age-related osteoporosis without current pathological fracture 02/04/2016  . Vitamin D deficiency 02/04/2016  . S/P TKR (total knee replacement) using cement 03/30/2014   Lysle Rubens, PT, DPT Meghan Alvarez 09/13/2019, 4:08 PM  [facility] [place] [address] [facility] [address] [place], [place], [address] Phone: [phone]   Fax:  [phone]  Name: Meghan Alvarez MRN: [id number] Date of Birth: April 08, 1934

## 2019-09-21 ENCOUNTER — Ambulatory Visit: Payer: Medicare Other | Admitting: Specialist

## 2019-09-25 ENCOUNTER — Ambulatory Visit: Payer: Medicare Other | Admitting: Physical Therapy

## 2019-09-25 ENCOUNTER — Other Ambulatory Visit: Payer: Self-pay

## 2019-09-25 ENCOUNTER — Encounter: Payer: Self-pay | Admitting: Physical Therapy

## 2019-09-25 DIAGNOSIS — M6281 Muscle weakness (generalized): Secondary | ICD-10-CM

## 2019-09-25 DIAGNOSIS — M5412 Radiculopathy, cervical region: Secondary | ICD-10-CM

## 2019-09-25 DIAGNOSIS — M542 Cervicalgia: Secondary | ICD-10-CM | POA: Diagnosis not present

## 2019-09-25 NOTE — Therapy (Signed)
[place], [place], [address] Phone: [phone]   Fax:  [phone]  Physical Therapy Treatment  Patient Details  Name: Meghan Alvarez MRN: [id number] Date of Birth: Sep 22, 1934 Referring Provider (PT): Louanne Skye   Encounter Date: 09/25/2019   PT End of Session - 09/25/19 1611    Visit Number 3    Date for PT Re-Evaluation 10/29/19    PT Start Time 1526    PT Stop Time 1619    PT Time Calculation (min) 53 min    Activity Tolerance Patient tolerated treatment well    Behavior During Therapy [facility] for tasks assessed/performed           Past Medical History:  Diagnosis Date  . Arthritis   . Complication of anesthesia   . Diabetes mellitus without complication ([place])   . Fibromyalgia   . PONV (postoperative nausea and vomiting)     Past Surgical History:  Procedure Laterality Date  . APPENDECTOMY  04   ruptured   . CERVICAL DISCECTOMY  91  . DILATION AND CURETTAGE OF UTERUS  05  . FRACTURE SURGERY Left 06   arm  . JOINT REPLACEMENT Left 08   knee repl  . SHOULDER ARTHROSCOPY Right 2001  . SHOULDER ARTHROSCOPY Left 07  . TONSILLECTOMY    . TOTAL KNEE ARTHROPLASTY Right 03/30/2014   DR NORRIS  . TOTAL KNEE ARTHROPLASTY Right 03/30/2014   Procedure: RIGHT TOTAL KNEE ARTHROPLASTY;  Surgeon: Netta Cedars, MD;  Location: [place];  Service: Orthopedics;  Laterality: Right;    There were no vitals filed for this visit.   Subjective Assessment - 09/25/19 1527    Subjective Pt reports that N/T in hand is getting better    Currently in Pain? Yes    Pain Score 4     Pain Location Neck                             [facility] Adult PT Treatment/Exercise - 09/25/19 0001      Neck Exercises: Machines for Strengthening   UBE (Upper Arm Bike) L3 3 min fwd/3 min bkwd    Cybex Row 15# 2x10    Cybex Chest Press 10# 2x10    Lat Pull 15# 2x10    Power Tower shoulder ext 10# 2x10       Neck Exercises: Seated   Neck Retraction 20 reps;3 secs   into ball     Moist Heat Therapy   Number Minutes Moist Heat 10 Minutes    Moist Heat Location Cervical      Manual Therapy   Manual Therapy Soft tissue mobilization    Soft tissue mobilization to thoracic paraspinals, UT, and suboccipitals B      Neck Exercises: Stretches   Upper Trapezius Stretch Right;Left;1 rep;30 seconds    Levator Stretch Right;Left;1 rep;30 seconds                    PT Short Term Goals - 09/13/19 1607      PT SHORT TERM GOAL #1   Title Pt will be independent with HEP    Time 2    Period Weeks    Status On-going    Target Date 09/12/19             PT Long Term Goals - 09/25/19 1532      PT LONG TERM GOAL #1  Title Pt will demo cervical ROM WFL and without increase in cervical pain    Time 6    Period Weeks    Status Partially Met      PT LONG TERM GOAL #2   Title Pt will report resolution of N/T in RUE    Time 6    Period Weeks    Status Partially Met      PT LONG TERM GOAL #3   Title Pt will report 50% reduction in cervical pain    Time 6    Period Weeks    Status Partially Met                 Plan - 09/25/19 1611    Clinical Impression Statement Pt tolerated progression to machine interventions well with no increase in cervical pain with exercise. Required cues for posture for all machine level interventions. Has been participating in water aerobics and wants to start working out on machines at [employer]. Educated on how to use several machines for neck/back; reinforce next rx. Pt reports relief with heat and STM.    PT Treatment/Interventions ADLs/Self Care Home Management;Electrical Stimulation;Traction;Moist Heat;Therapeutic activities;Therapeutic exercise;Neuromuscular re-education;Manual techniques;Patient/family education;Passive range of motion;Dry needling    PT Next Visit Plan cervical ROM/flexibility, scap stab    Consulted and Agree with Plan of  Care Patient           Patient will benefit from skilled therapeutic intervention in order to improve the following deficits and impairments:  Decreased range of motion, Increased muscle spasms, Impaired UE functional use, Pain, Impaired flexibility, Postural dysfunction, Decreased strength  Visit Diagnosis: Neck pain  Muscle weakness (generalized)  Radiculopathy, cervical region     Problem List Patient Active Problem List   Diagnosis Date Noted  . Myofascial pain syndrome 02/04/2016  . Spondylosis of lumbar region without myelopathy or radiculopathy 02/04/2016  . DJD (degenerative joint disease), cervical 02/04/2016  . Age-related osteoporosis without current pathological fracture 02/04/2016  . Vitamin D deficiency 02/04/2016  . S/P TKR (total knee replacement) using cement 03/30/2014   Amador Cunas, PT, DPT Donald Prose Marvell Stavola 09/25/2019, 4:14 PM  [place], [place], [address] Phone: [phone]   Fax:  [phone]  Name: Meghan Alvarez MRN: [id number] Date of Birth: 12-17-1934

## 2019-09-27 ENCOUNTER — Ambulatory Visit: Payer: Medicare Other | Admitting: Specialist

## 2019-09-29 ENCOUNTER — Encounter: Payer: Self-pay | Admitting: Specialist

## 2019-09-29 ENCOUNTER — Other Ambulatory Visit: Payer: Self-pay

## 2019-09-29 ENCOUNTER — Ambulatory Visit: Payer: Medicare Other | Admitting: Specialist

## 2019-09-29 ENCOUNTER — Encounter: Payer: Medicare Other | Admitting: Physical Medicine and Rehabilitation

## 2019-09-29 VITALS — BP 146/84 | HR 76 | Ht 65.0 in | Wt 183.0 lb

## 2019-09-29 DIAGNOSIS — R200 Anesthesia of skin: Secondary | ICD-10-CM | POA: Diagnosis not present

## 2019-09-29 DIAGNOSIS — M542 Cervicalgia: Secondary | ICD-10-CM | POA: Diagnosis not present

## 2019-09-29 DIAGNOSIS — M4722 Other spondylosis with radiculopathy, cervical region: Secondary | ICD-10-CM | POA: Diagnosis not present

## 2019-09-29 DIAGNOSIS — G5601 Carpal tunnel syndrome, right upper limb: Secondary | ICD-10-CM

## 2019-09-29 NOTE — Progress Notes (Addendum)
Office Visit Note   Patient: Meghan Alvarez           Date of Birth: 12/10/1934           MRN: [id number] Visit Date: 09/29/2019              Requested by: Catha Gosselin, MD [address] [place],  [place] [address] PCP: Catha Gosselin, MD   Assessment & Plan: Visit Diagnoses:  1. Cervicalgia   2. Other spondylosis with radiculopathy, cervical region   3. Numbness of right hand   4. Carpal tunnel syndrome, right upper limb     Plan:  Carpal tunnel syndrome is a condition that causes pain in your hand and arm. The carpal tunnel is a narrow area located on the palm side of your wrist. Repeated wrist motion or certain diseases may cause swelling within the tunnel. This swelling pinches the main nerve in the wrist (median nerve). What are the causes? This condition may be caused by:  Repeated wrist motions.  Wrist injuries.  Arthritis.  A cyst or tumor in the carpal tunnel.  Fluid buildup during pregnancy. Sometimes the cause of this condition is not known. What increases the risk? This condition is more likely to develop in:  People who have jobs that cause them to repeatedly move their wrists in the same motion, such as Health visitor.  Women.  People with certain conditions, such as: ? Diabetes. ? Obesity. ? An underactive thyroid (hypothyroidism). ? Kidney failure. What are the signs or symptoms? Symptoms of this condition include:  A tingling feeling in your fingers, especially in your thumb, index, and middle fingers.  Tingling or numbness in your hand.  An aching feeling in your entire arm, especially when your wrist and elbow are bent for long periods of time.  Wrist pain that goes up your arm to your shoulder.  Pain that goes down into your palm or fingers.  A weak feeling in your hands. You may have trouble grabbing and holding items. Your symptoms may feel worse during the night. How is this diagnosed? This condition is diagnosed with a  medical history and physical exam. You may also have tests, including:  An electromyogram (EMG). This test measures electrical signals sent by your nerves into the muscles.  X-rays. How is this treated? Treatment for this condition includes:  Lifestyle changes. It is important to stop doing or modify the activity that caused your condition.  Physical or occupational therapy.  Medicines for pain and inflammation. This may include medicine that is injected into your wrist.  A wrist splint.  Surgery. Follow these instructions at home: If you have a splint:   Wear it as told by your health care provider. Remove it only as told by your health care provider.  Loosen the splint if your fingers become numb and tingle, or if they turn cold and blue.  Keep the splint clean and dry. General instructions   Take over-the-counter and prescription medicines only as told by your health care provider.  Rest your wrist from any activity that may be causing your pain. If your condition is work related, talk to your employer about changes that can be made, such as getting a wrist pad to use while typing.  If directed, apply ice to the painful area: ? Put ice in a plastic bag. ? Place a towel between your skin and the bag. ? Leave the ice on for 20 minutes, 2-3 times per day.  Keep all follow-up visits as told by your health care provider. This is important.  Do any exercises as told by your health care provider, physical therapist, or occupational therapist. Contact a health care provider if:  You have new symptoms.  Your pain is not controlled with medicines.  Your symptoms get worse. This information is not intended to replace advice given to you by your health care provider. Make sure you discuss any questions you have with your health care provider. Document Released: 12/27/1999 Document Revised: 05/09/2015 Document Reviewed: 09/09/2016 Elsevier Interactive Patient Education  2017  [employer] Vitamin B6 and folic acid and B12 are helpful building blocks for nerve regeneration and do improve symptoms of  Carpal tunnel syndrome I recommend using a right wrist splint for 2 weeks than at night for 2 weeks then as needed for symptoms of hand numbness and tingling.   Follow-Up Instructions: Return in about 3 months (around 12/29/2019).   Orders:  No orders of the defined types were placed in this encounter.  No orders of the defined types were placed in this encounter.     Procedures: No procedures performed   Clinical Data: No additional findings.   Subjective: Chief Complaint  Patient presents with  . Neck - Follow-up  . Right Hand - Follow-up    EMG/NCS Review    85 year old right handed with history of right hand numbness and tingling. Underwent EMG/NCV right hand with findings of  Right carpal tunnel syndrome of a moderated degree. She has not tried conservative treatment as yet. No bowel or bladder  Difficulty. No neck stiffness. She does water aerobics and there is back and upper shoulder discomfort.    Review of Systems  Constitutional: Negative.   HENT: Negative.   Eyes: Negative.   Respiratory: Negative.   Cardiovascular: Negative.   Gastrointestinal: Negative.   Endocrine: Negative.   Genitourinary: Negative.   Musculoskeletal: Negative.   Skin: Negative.   Allergic/Immunologic: Negative.   Neurological: Negative.   Hematological: Negative.   Psychiatric/Behavioral: Negative.      Objective: Vital Signs: BP (!) 146/84   Pulse 76   Ht 5\' 5"  (1.651 m)   Wt 183 lb (83 kg)   BMI 30.45 kg/m   Physical Exam Constitutional:      Appearance: She is well-developed.  HENT:     Head: Normocephalic and atraumatic.  Eyes:     Pupils: Pupils are equal, round, and reactive to light.  Pulmonary:     Effort: Pulmonary effort is normal.     Breath sounds: Normal breath sounds.  Abdominal:     General: Bowel sounds are normal.      Palpations: Abdomen is soft.  Musculoskeletal:     Cervical back: Normal range of motion and neck supple.     Lumbar back: Negative right straight leg raise test and negative left straight leg raise test.  Skin:    General: Skin is warm and dry.  Neurological:     Mental Status: She is alert and oriented to person, place, and time.  Psychiatric:        Behavior: Behavior normal.        Thought Content: Thought content normal.        Judgment: Judgment normal.     Back Exam   Tenderness  The patient is experiencing tenderness in the cervical.  Range of Motion  Extension: abnormal  Flexion: abnormal  Lateral bend right: normal  Lateral bend left: normal  Rotation  right: normal  Rotation left: normal   Muscle Strength  Right Quadriceps:  5/5  Left Quadriceps:  5/5  Right Hamstrings:  5/5  Left Hamstrings:  5/5   Tests  Straight leg raise right: negative Straight leg raise left: negative  Other  Toe walk: abnormal Heel walk: abnormal Gait: normal  Erythema: no back redness Scars: absent      Specialty Comments:  No specialty comments available.  Imaging: No results found.   PMFS History: Patient Active Problem List   Diagnosis Date Noted  . Myofascial pain syndrome 02/04/2016  . Spondylosis of lumbar region without myelopathy or radiculopathy 02/04/2016  . DJD (degenerative joint disease), cervical 02/04/2016  . Age-related osteoporosis without current pathological fracture 02/04/2016  . Vitamin D deficiency 02/04/2016  . S/P TKR (total knee replacement) using cement 03/30/2014   Past Medical History:  Diagnosis Date  . Arthritis   . Complication of anesthesia   . Diabetes mellitus without complication (HCC)   . Fibromyalgia   . PONV (postoperative nausea and vomiting)     Family History  Problem Relation Age of Onset  . Cancer Sister        breast  . Diabetes Brother     Past Surgical History:  Procedure Laterality Date  . APPENDECTOMY   04   ruptured   . CERVICAL DISCECTOMY  91  . DILATION AND CURETTAGE OF UTERUS  05  . FRACTURE SURGERY Left 06   arm  . JOINT REPLACEMENT Left 08   knee repl  . SHOULDER ARTHROSCOPY Right 2001  . SHOULDER ARTHROSCOPY Left 07  . TONSILLECTOMY    . TOTAL KNEE ARTHROPLASTY Right 03/30/2014   DR NORRIS  . TOTAL KNEE ARTHROPLASTY Right 03/30/2014   Procedure: RIGHT TOTAL KNEE ARTHROPLASTY;  Surgeon: Beverely Low, MD;  Location: [facility] OR;  Service: Orthopedics;  Laterality: Right;   Social History   Occupational History  . Not on file  Tobacco Use  . Smoking status: Never Smoker  . Smokeless tobacco: Never Used  Vaping Use  . Vaping Use: Never used  Substance and Sexual Activity  . Alcohol use: Yes    Comment: RARELY  . Drug use: Never  . Sexual activity: Not on file

## 2019-09-29 NOTE — Patient Instructions (Signed)
Carpal Tunnel Syndrome  Carpal tunnel syndrome is a condition that causes pain in your hand and arm. The carpal tunnel is a narrow area located on the palm side of your wrist. Repeated wrist motion or certain diseases may cause swelling within the tunnel. This swelling pinches the main nerve in the wrist (median nerve). What are the causes? This condition may be caused by:  Repeated wrist motions.  Wrist injuries.  Arthritis.  A cyst or tumor in the carpal tunnel.  Fluid buildup during pregnancy. Sometimes the cause of this condition is not known. What increases the risk? This condition is more likely to develop in:  People who have jobs that cause them to repeatedly move their wrists in the same motion, such as Health visitor.  Women.  People with certain conditions, such as: ? Diabetes. ? Obesity. ? An underactive thyroid (hypothyroidism). ? Kidney failure. What are the signs or symptoms? Symptoms of this condition include:  A tingling feeling in your fingers, especially in your thumb, index, and middle fingers.  Tingling or numbness in your hand.  An aching feeling in your entire arm, especially when your wrist and elbow are bent for long periods of time.  Wrist pain that goes up your arm to your shoulder.  Pain that goes down into your palm or fingers.  A weak feeling in your hands. You may have trouble grabbing and holding items. Your symptoms may feel worse during the night. How is this diagnosed? This condition is diagnosed with a medical history and physical exam. You may also have tests, including:  An electromyogram (EMG). This test measures electrical signals sent by your nerves into the muscles.  X-rays. How is this treated? Treatment for this condition includes:  Lifestyle changes. It is important to stop doing or modify the activity that caused your condition.  Physical or occupational therapy.  Medicines for pain and inflammation. This may  include medicine that is injected into your wrist.  A wrist splint.  Surgery. Follow these instructions at home: If you have a splint:   Wear it as told by your health care provider. Remove it only as told by your health care provider.  Loosen the splint if your fingers become numb and tingle, or if they turn cold and blue.  Keep the splint clean and dry. General instructions   Take over-the-counter and prescription medicines only as told by your health care provider.  Rest your wrist from any activity that may be causing your pain. If your condition is work related, talk to your employer about changes that can be made, such as getting a wrist pad to use while typing.  If directed, apply ice to the painful area: ? Put ice in a plastic bag. ? Place a towel between your skin and the bag. ? Leave the ice on for 20 minutes, 2-3 times per day.  Keep all follow-up visits as told by your health care provider. This is important.  Do any exercises as told by your health care provider, physical therapist, or occupational therapist. Contact a health care provider if:  You have new symptoms.  Your pain is not controlled with medicines.  Your symptoms get worse. This information is not intended to replace advice given to you by your health care provider. Make sure you discuss any questions you have with your health care provider. Document Released: 12/27/1999 Document Revised: 05/09/2015 Document Reviewed: 09/09/2016 Elsevier Interactive Patient Education  2017 [employer]. Vitamin B6 and folic acid  and B12 are helpful building blocks for nerve regeneration and do improve symptoms of  Carpal tunnel syndrome I recommend using a right wrist splint for 2 weeks than at night for 2 weeks then as needed for symptoms of hand numbness and tingling.

## 2019-10-04 ENCOUNTER — Encounter: Payer: Self-pay | Admitting: Physical Therapy

## 2019-10-04 ENCOUNTER — Other Ambulatory Visit: Payer: Self-pay

## 2019-10-04 ENCOUNTER — Ambulatory Visit: Payer: Medicare Other | Admitting: Physical Therapy

## 2019-10-04 DIAGNOSIS — M542 Cervicalgia: Secondary | ICD-10-CM | POA: Diagnosis not present

## 2019-10-04 DIAGNOSIS — M6281 Muscle weakness (generalized): Secondary | ICD-10-CM

## 2019-10-04 DIAGNOSIS — M5412 Radiculopathy, cervical region: Secondary | ICD-10-CM

## 2019-10-04 NOTE — Therapy (Signed)
[facility] [place], [place], [address] Phone: [phone]   Fax:  [phone]  Physical Therapy Treatment  Patient Details  Name: Meghan Alvarez MRN: [id number] Date of Birth: 1934-07-26 Referring Provider (PT): Louanne Skye   Encounter Date: 10/04/2019   PT End of Session - 10/04/19 1434    Visit Number 4    Date for PT Re-Evaluation 10/29/19    PT Start Time 1400    PT Stop Time 1446    PT Time Calculation (min) 46 min    Activity Tolerance Patient tolerated treatment well    Behavior During Therapy [facility] for tasks assessed/performed           Past Medical History:  Diagnosis Date  . Arthritis   . Complication of anesthesia   . Diabetes mellitus without complication ([facility])   . Fibromyalgia   . PONV (postoperative nausea and vomiting)     Past Surgical History:  Procedure Laterality Date  . APPENDECTOMY  04   ruptured   . CERVICAL DISCECTOMY  91  . DILATION AND CURETTAGE OF UTERUS  05  . FRACTURE SURGERY Left 06   arm  . JOINT REPLACEMENT Left 08   knee repl  . SHOULDER ARTHROSCOPY Right 2001  . SHOULDER ARTHROSCOPY Left 07  . TONSILLECTOMY    . TOTAL KNEE ARTHROPLASTY Right 03/30/2014   DR NORRIS  . TOTAL KNEE ARTHROPLASTY Right 03/30/2014   Procedure: RIGHT TOTAL KNEE ARTHROPLASTY;  Surgeon: Netta Cedars, MD;  Location: [place] [facility];  Service: Orthopedics;  Laterality: Right;    There were no vitals filed for this visit.   Subjective Assessment - 10/04/19 1359    Subjective Pt reports that received nerve conduction study and that showed she has R carpal tunnel; pt is wearing wrist brace per MD. Pt states that overall neck pain and wrist N/T is getting better.    Currently in Pain? Yes    Pain Score 4     Pain Location Neck    Pain Orientation Right                             [facility] Adult PT Treatment/Exercise - 10/04/19 0001      Neck Exercises: Machines for Strengthening   UBE  (Upper Arm Bike) L3 3 min fwd/3 min bkwd    Cybex Row 15# 2x10    Cybex Chest Press 10# 2x10    Lat Pull 15# 2x10    Power Tower shoulder ext 10# 2x10; AR press 2x10 B 10#      Moist Heat Therapy   Number Minutes Moist Heat 12 Minutes    Moist Heat Location Cervical;Lumbar Spine      Electrical Stimulation   Electrical Stimulation Location Cervical/Thoracic    Electrical Stimulation Action IFC    Electrical Stimulation Parameters supine    Electrical Stimulation Goals Pain                    PT Short Term Goals - 09/13/19 1607      PT SHORT TERM GOAL #1   Title Pt will be independent with HEP    Time 2    Period Weeks    Status On-going    Target Date 09/12/19             PT Long Term Goals - 09/25/19 1532      PT LONG TERM GOAL #1  Title Pt will demo cervical ROM WFL and without increase in cervical pain    Time 6    Period Weeks    Status Partially Met      PT LONG TERM GOAL #2   Title Pt will report resolution of N/T in RUE    Time 6    Period Weeks    Status Partially Met      PT LONG TERM GOAL #3   Title Pt will report 50% reduction in cervical pain    Time 6    Period Weeks    Status Partially Met                 Plan - 10/04/19 1435    Clinical Impression Statement Pt reports decreased N/T in R hand and slightly improved R neck pain this rx. Pt tolerated machine interventions with no complaints of increased cervical/thoracic pain. Cues for posture/form with standing shoulder extensions and AR press.    PT Treatment/Interventions ADLs/Self Care Home Management;Electrical Stimulation;Traction;Moist Heat;Therapeutic activities;Therapeutic exercise;Neuromuscular re-education;Manual techniques;Patient/family education;Passive range of motion;Dry needling    PT Next Visit Plan cervical ROM/flexibility, scap stab    Consulted and Agree with Plan of Care Patient           Patient will benefit from skilled therapeutic intervention in  order to improve the following deficits and impairments:  Decreased range of motion, Increased muscle spasms, Impaired UE functional use, Pain, Impaired flexibility, Postural dysfunction, Decreased strength  Visit Diagnosis: Neck pain  Muscle weakness (generalized)  Radiculopathy, cervical region     Problem List Patient Active Problem List   Diagnosis Date Noted  . Myofascial pain syndrome 02/04/2016  . Spondylosis of lumbar region without myelopathy or radiculopathy 02/04/2016  . DJD (degenerative joint disease), cervical 02/04/2016  . Age-related osteoporosis without current pathological fracture 02/04/2016  . Vitamin D deficiency 02/04/2016  . S/P TKR (total knee replacement) using cement 03/30/2014   Amador Cunas, PT, DPT Donald Prose Bralynn Donado 10/04/2019, 2:37 PM  [facility]. [place], [place], [address] Phone: [phone]   Fax:  [phone]  Name: Meghan Alvarez MRN: [id number] Date of Birth: 11/18/1934

## 2019-10-09 ENCOUNTER — Other Ambulatory Visit: Payer: Self-pay | Admitting: Physician Assistant

## 2019-10-09 NOTE — Telephone Encounter (Signed)
Last Visit: 08/08/2019 Next Visit: 02/07/2020  Last Fill: 08/16/2019  Okay to refill Methocarbamol?

## 2019-10-12 ENCOUNTER — Encounter: Payer: Medicare Other | Admitting: Physical Therapy

## 2019-10-18 ENCOUNTER — Ambulatory Visit: Payer: Medicare Other | Admitting: Physical Therapy

## 2019-12-28 ENCOUNTER — Other Ambulatory Visit: Payer: Self-pay

## 2019-12-28 ENCOUNTER — Encounter: Payer: Self-pay | Admitting: Specialist

## 2019-12-28 ENCOUNTER — Ambulatory Visit: Payer: Medicare Other | Admitting: Specialist

## 2019-12-28 VITALS — BP 145/77 | HR 71 | Ht 65.0 in | Wt 183.0 lb

## 2019-12-28 DIAGNOSIS — R200 Anesthesia of skin: Secondary | ICD-10-CM | POA: Diagnosis not present

## 2019-12-28 DIAGNOSIS — M4722 Other spondylosis with radiculopathy, cervical region: Secondary | ICD-10-CM | POA: Diagnosis not present

## 2019-12-28 DIAGNOSIS — G5601 Carpal tunnel syndrome, right upper limb: Secondary | ICD-10-CM

## 2019-12-28 DIAGNOSIS — M542 Cervicalgia: Secondary | ICD-10-CM

## 2019-12-28 DIAGNOSIS — M5030 Other cervical disc degeneration, unspecified cervical region: Secondary | ICD-10-CM

## 2019-12-28 MED ORDER — GABAPENTIN 100 MG PO CAPS: 100.0000 mg | ORAL_CAPSULE | Freq: Every day | ORAL | 3 refills | Status: DC

## 2019-12-28 MED ORDER — DICLOFENAC SODIUM 1 % EX GEL: 2.0000 g | Freq: Four times a day (QID) | CUTANEOUS | 3 refills | Status: AC

## 2019-12-28 NOTE — Progress Notes (Signed)
Office Visit Note   Patient: Meghan Alvarez           Date of Birth: [birth year]           MRN: [id number] Visit Date: 12/28/2019              Requested by: Catha Gosselin, MD [address] [place],  [place] [address] PCP: Catha Gosselin, MD   Assessment & Plan: Visit Diagnoses:  1. Carpal tunnel syndrome, right upper limb   2. Other spondylosis with radiculopathy, cervical region   3. Numbness of right hand   4. Degenerative disc disease, cervical   5. Cervicalgia     Plan: Carpal Tunnel Syndrome  Carpal tunnel syndrome is a condition that causes pain in your hand and arm. The carpal tunnel is a narrow area located on the palm side of your wrist. Repeated wrist motion or certain diseases may cause swelling within the tunnel. This swelling pinches the main nerve in the wrist (median nerve). What are the causes? This condition may be caused by:  Repeated wrist motions.  Wrist injuries.  Arthritis.  A cyst or tumor in the carpal tunnel.  Fluid buildup during pregnancy. Sometimes the cause of this condition is not known. What increases the risk? This condition is more likely to develop in:  People who have jobs that cause them to repeatedly move their wrists in the same motion, such as Health visitor.  Women.  People with certain conditions, such as: ? Diabetes. ? Obesity. ? An underactive thyroid (hypothyroidism). ? Kidney failure. What are the signs or symptoms? Symptoms of this condition include:  A tingling feeling in your fingers, especially in your thumb, index, and middle fingers.  Tingling or numbness in your hand.  An aching feeling in your entire arm, especially when your wrist and elbow are bent for long periods of time.  Wrist pain that goes up your arm to your shoulder.  Pain that goes down into your palm or fingers.  A weak feeling in your hands. You may have trouble grabbing and holding items. Your symptoms may feel worse during the  night. How is this diagnosed? This condition is diagnosed with a medical history and physical exam. You may also have tests, including:  An electromyogram (EMG). This test measures electrical signals sent by your nerves into the muscles.  X-rays. How is this treated? Treatment for this condition includes:  Lifestyle changes. It is important to stop doing or modify the activity that caused your condition.  Physical or occupational therapy.  Medicines for pain and inflammation. This may include medicine that is injected into your wrist.  A wrist splint.  Surgery. Follow these instructions at home: If you have a splint:   Wear it as told by your health care provider. Remove it only as told by your health care provider.  Loosen the splint if your fingers become numb and tingle, or if they turn cold and blue.  Keep the splint clean and dry. General instructions   Take over-the-counter and prescription medicines only as told by your health care provider.  Rest your wrist from any activity that may be causing your pain. If your condition is work related, talk to your employer about changes that can be made, such as getting a wrist pad to use while typing.  If directed, apply ice to the painful area: ? Put ice in a plastic bag. ? Place a towel between your skin and the bag. ? Leave  the ice on for 20 minutes, 2-3 times per day.  Keep all follow-up visits as told by your health care provider. This is important.  Do any exercises as told by your health care provider, physical therapist, or occupational therapist. Contact a health care provider if:  You have new symptoms.  Your pain is not controlled with medicines.  Your symptoms get worse. This information is not intended to replace advice given to you by your health care provider. Make sure you discuss any questions you have with your health care provider. Use diclofenac gel 2 gm to the right palm side of the wrist 3-4 times  per day. Document Released: 12/27/1999 Document Revised: 05/09/2015 Document Reviewed: 09/09/2016 Elsevier Interactive Patient Education  2017 [employer] Vitamin B complex, B12, B6, Thiamine, folic acid are helpful in nerve recovery, also some form of creel or fish oil.  Follow-Up Instructions: Return in about 2 months (around 02/28/2020).   Orders:  No orders of the defined types were placed in this encounter.  Meds ordered this encounter  Medications  . gabapentin (NEURONTIN) 100 MG capsule    Sig: Take 1 capsule (100 mg total) by mouth at bedtime.    Dispense:  30 capsule    Refill:  3  . diclofenac Sodium (VOLTAREN) 1 % GEL    Sig: Apply 2 g topically 4 (four) times daily.    Dispense:  350 g    Refill:  3      Procedures: No procedures performed   Clinical Data: No additional findings.   Subjective: Chief Complaint  Patient presents with  . Neck - Follow-up    85  Year old female with history of neck pain with some right hand nymbness and tingling. She is using a right carpal tunnel splint. Even with the splint she is experiencing right hand numbness and tingling. No bowel or bladder difficulty. She is not having any balance issues. She has diabetes and is not taking antihyperglycemic or insulin. She lost 36 lbs and that decreased her BG. She is always concerned about the recurrence of diabetes.   Review of Systems   Objective: Vital Signs: BP (!) 145/77 (BP Location: Left Arm, Patient Position: Sitting)   Pulse 71   Ht 5\' 5"  (1.651 m)   Wt 183 lb (83 kg)   BMI 30.45 kg/m   Physical Exam  Ortho Exam  Specialty Comments:  No specialty comments available.  Imaging: No results found.   PMFS History: Patient Active Problem List   Diagnosis Date Noted  . Myofascial pain syndrome 02/04/2016  . Spondylosis of lumbar region without myelopathy or radiculopathy 02/04/2016  . DJD (degenerative joint disease), cervical 02/04/2016  . Age-related  osteoporosis without current pathological fracture 02/04/2016  . Vitamin D deficiency 02/04/2016  . S/P TKR (total knee replacement) using cement 03/30/2014   Past Medical History:  Diagnosis Date  . Arthritis   . Complication of anesthesia   . Diabetes mellitus without complication ([facility])   . Fibromyalgia   . PONV (postoperative nausea and vomiting)     Family History  Problem Relation Age of Onset  . Cancer Sister        breast  . Diabetes Brother     Past Surgical History:  Procedure Laterality Date  . APPENDECTOMY  04   ruptured   . CERVICAL DISCECTOMY  91  . DILATION AND CURETTAGE OF UTERUS  05  . FRACTURE SURGERY Left 06   arm  . JOINT REPLACEMENT Left  08   knee repl  . SHOULDER ARTHROSCOPY Right 2001  . SHOULDER ARTHROSCOPY Left 07  . TONSILLECTOMY    . TOTAL KNEE ARTHROPLASTY Right 03/30/2014   DR NORRIS  . TOTAL KNEE ARTHROPLASTY Right 03/30/2014   Procedure: RIGHT TOTAL KNEE ARTHROPLASTY;  Surgeon: Beverely Low, MD;  Location[facility];  Service: Orthopedics;  Laterality: Right;   Social History   Occupational History  . Not on file  Tobacco Use  . Smoking status: Never Smoker  . Smokeless tobacco: Never Used  Vaping Use  . Vaping Use: Never used  Substance and Sexual Activity  . Alcohol use: Yes    Comment: RARELY  . Drug use: Never  . Sexual activity: Not on file

## 2019-12-28 NOTE — Patient Instructions (Addendum)
Carpal Tunnel Syndrome  Carpal tunnel syndrome is a condition that causes pain in your hand and arm. The carpal tunnel is a narrow area located on the palm side of your wrist. Repeated wrist motion or certain diseases may cause swelling within the tunnel. This swelling pinches the main nerve in the wrist (median nerve). What are the causes? This condition may be caused by:  Repeated wrist motions.  Wrist injuries.  Arthritis.  A cyst or tumor in the carpal tunnel.  Fluid buildup during pregnancy. Sometimes the cause of this condition is not known. What increases the risk? This condition is more likely to develop in:  People who have jobs that cause them to repeatedly move their wrists in the same motion, such as Health visitor.  Women.  People with certain conditions, such as: ? Diabetes. ? Obesity. ? An underactive thyroid (hypothyroidism). ? Kidney failure. What are the signs or symptoms? Symptoms of this condition include:  A tingling feeling in your fingers, especially in your thumb, index, and middle fingers.  Tingling or numbness in your hand.  An aching feeling in your entire arm, especially when your wrist and elbow are bent for long periods of time.  Wrist pain that goes up your arm to your shoulder.  Pain that goes down into your palm or fingers.  A weak feeling in your hands. You may have trouble grabbing and holding items. Your symptoms may feel worse during the night. How is this diagnosed? This condition is diagnosed with a medical history and physical exam. You may also have tests, including:  An electromyogram (EMG). This test measures electrical signals sent by your nerves into the muscles.  X-rays. How is this treated? Treatment for this condition includes:  Lifestyle changes. It is important to stop doing or modify the activity that caused your condition.  Physical or occupational therapy.  Medicines for pain and inflammation. This may  include medicine that is injected into your wrist.  A wrist splint.  Surgery. Follow these instructions at home: If you have a splint:   Wear it as told by your health care provider. Remove it only as told by your health care provider.  Loosen the splint if your fingers become numb and tingle, or if they turn cold and blue.  Keep the splint clean and dry. General instructions   Take over-the-counter and prescription medicines only as told by your health care provider.  Rest your wrist from any activity that may be causing your pain. If your condition is work related, talk to your employer about changes that can be made, such as getting a wrist pad to use while typing.  If directed, apply ice to the painful area: ? Put ice in a plastic bag. ? Place a towel between your skin and the bag. ? Leave the ice on for 20 minutes, 2-3 times per day.  Keep all follow-up visits as told by your health care provider. This is important.  Do any exercises as told by your health care provider, physical therapist, or occupational therapist. Contact a health care provider if:  You have new symptoms.  Your pain is not controlled with medicines.  Your symptoms get worse. This information is not intended to replace advice given to you by your health care provider. Make sure you discuss any questions you have with your health care provider. Use diclofenac gel 2 gm to the right palm side of the wrist 3-4 times per day. Document Released: 12/27/1999 Document Revised:  05/09/2015 Document Reviewed: 09/09/2016 Elsevier Interactive Patient Education  2017 [employer]. Vitamin B complex, B12, B6, Thiamine, folic acid are helpful in nerve recovery, also some form of creel or fish oil.

## 2020-01-13 HISTORY — PX: CATARACT EXTRACTION, BILATERAL: SHX1313

## 2020-01-19 ENCOUNTER — Other Ambulatory Visit: Payer: Self-pay

## 2020-01-19 ENCOUNTER — Encounter: Payer: Self-pay | Admitting: Internal Medicine

## 2020-01-19 ENCOUNTER — Encounter: Payer: Self-pay | Admitting: *Deleted

## 2020-01-19 ENCOUNTER — Ambulatory Visit: Payer: Medicare Other | Admitting: Internal Medicine

## 2020-01-19 VITALS — BP 140/78 | HR 70 | Ht 65.0 in | Wt 184.0 lb

## 2020-01-19 DIAGNOSIS — R079 Chest pain, unspecified: Secondary | ICD-10-CM

## 2020-01-19 DIAGNOSIS — E7849 Other hyperlipidemia: Secondary | ICD-10-CM | POA: Insufficient documentation

## 2020-01-19 DIAGNOSIS — I10 Essential (primary) hypertension: Secondary | ICD-10-CM | POA: Diagnosis not present

## 2020-01-19 DIAGNOSIS — E119 Type 2 diabetes mellitus without complications: Secondary | ICD-10-CM | POA: Insufficient documentation

## 2020-01-19 NOTE — Progress Notes (Signed)
Cardiology Office Note:    Date:  01/19/2020   ID:  Meghan Alvarez, DOB Jan 04, 1935, MRN [id number]  PCP:  Hulan Fess, MD  [facility] Cardiologist:  Werner Lean, MD  [place] Electrophysiologist:  None   CC: Chest pain Consulted for the evaluation of chest pain at the behest of Hulan Fess, MD  History of Present Illness:    Meghan Alvarez is a 86 y.o. female with a hx DM with HTN, HLD who presents for evaluation.  Patient notes that she is feeling sternal chest pain.  One day it hurt her all morning, but then it stopped.  Notes occasional chest pain that comes and goes.  This has been going on since before her 12/2019 physical.  This sternal chest pain occurs spontaneously. Discomfort occurs last one week prior, worsens with no trigger, and improves with no intervention.  Patient exertion notable for bowling or going up stairs and feels no symptoms.  No shortness of breath, DOE .  No PND or orthopnea.  No bendopnea, notes weight gain, no leg swelling, or abdominal swelling.  No syncope or near syncope .Notes  no palpitations or funny heart beats.     Patient reports NO prior cardiac testing including  echo,  stress test,  heart catheterizations,  cardioversion,  ablations.   Past Medical History:  Diagnosis Date  . Arthritis   . Complication of anesthesia   . Diabetes mellitus without complication ([place])   . Fibromyalgia   . PONV (postoperative nausea and vomiting)     Past Surgical History:  Procedure Laterality Date  . APPENDECTOMY  04   ruptured   . CERVICAL DISCECTOMY  91  . DILATION AND CURETTAGE OF UTERUS  05  . FRACTURE SURGERY Left 06   arm  . JOINT REPLACEMENT Left 08   knee repl  . SHOULDER ARTHROSCOPY Right 2001  . SHOULDER ARTHROSCOPY Left 07  . TONSILLECTOMY    . TOTAL KNEE ARTHROPLASTY Right 03/30/2014   DR NORRIS  . TOTAL KNEE ARTHROPLASTY Right 03/30/2014   Procedure: RIGHT TOTAL KNEE ARTHROPLASTY;  Surgeon: Netta Cedars, MD;   Location: [place];  Service: Orthopedics;  Laterality: Right;    Current Medications: Current Meds  Medication Sig  . cholecalciferol (VITAMIN D) 1000 units tablet Take 1,000 Units by mouth daily.  . cyclobenzaprine (FLEXERIL) 10 MG tablet Take 1 tablet (10 mg total) by mouth at bedtime as needed for muscle spasms.  . diclofenac Sodium (VOLTAREN) 1 % GEL Apply 2 g topically 4 (four) times daily.  . DULoxetine (CYMBALTA) 60 MG capsule Take 60 mg by mouth daily.  Marland Kitchen gabapentin (NEURONTIN) 100 MG capsule Take 1 capsule (100 mg total) by mouth at bedtime.  . meloxicam (MOBIC) 15 MG tablet daily in the afternoon. pain  . methocarbamol (ROBAXIN) 500 MG tablet Take 1 tablet (500 mg total) by mouth daily as needed for muscle spasms.  . Multiple Vitamin (MULTIVITAMIN WITH MINERALS) TABS tablet Take 1 tablet by mouth daily. Centrum  . Omega-3 Fatty Acids (FISH OIL) 1000 MG CAPS Take 1,000 mg by mouth at bedtime.  . promethazine-dextromethorphan (PROMETHAZINE-DM) 6.25-15 MG/5ML syrup as needed.  . rosuvastatin (CRESTOR) 5 MG tablet Take by mouth daily.   . traMADol (ULTRAM) 50 MG tablet Take 50 mg by mouth 2 (two) times daily as needed.  . vitamin B-12 (CYANOCOBALAMIN) 1000 MCG tablet Take 1,000 mcg by mouth daily.     Allergies:   Alendronate sodium, Azithromycin, Codeine, and Nsaids  Social History   Socioeconomic History  . Marital status: Married    Spouse name: Not on file  . Number of children: Not on file  . Years of education: Not on file  . Highest education level: Not on file  Occupational History  . Not on file  Tobacco Use  . Smoking status: Never Smoker  . Smokeless tobacco: Never Used  Vaping Use  . Vaping Use: Never used  Substance and Sexual Activity  . Alcohol use: Yes    Comment: RARELY  . Drug use: Never  . Sexual activity: Not on file  Other Topics Concern  . Not on file  Social History Narrative  . Not on file   Social Determinants of Health   Financial  Resource Strain: Not on file  Food Insecurity: Not on file  Transportation Needs: Not on file  Physical Activity: Not on file  Stress: Not on file  Social Connections: Not on file     Family History: The patient's family history includes Cancer in her sister; Diabetes in her brother. History of coronary artery disease notable for no members. History of heart failure notable for no members. History of arrhythmia notable for no members. No history of bicuspid aortic valve or aortic aneurysm or dissection.  ROS:   Please see the history of present illness.     All other systems reviewed and are negative.  EKGs/Labs/Other Studies Reviewed:    The following studies were reviewed today:   EKG:   01/19/2020: SR rate 70, WNL  08/19/2017:  SR rate 89, nonspecific TWI  Recent Labs: No results found for requested labs within last 8760 hours.  Recent Lipid Panel No results found for: CHOL, TRIG, HDL, CHOLHDL, VLDL, LDLCALC, LDLDIRECT  OSH Labs 12/28/19 Cholesterol 107 HDL 50 LDL 38 Triglycerides 103  Risk Assessment/Calculations:     N/A  Physical Exam:    VS:  BP 140/78   Pulse 70   Ht 5\' 5"  (1.651 m)   Wt 184 lb (83.5 kg)   SpO2 96%   BMI 30.62 kg/m     Wt Readings from Last 3 Encounters:  01/19/20 184 lb (83.5 kg)  12/28/19 183 lb (83 kg)  09/29/19 183 lb (83 kg)    GEN:  Well nourished, well developed in no acute distress HEENT: Normal NECK: No JVD; No carotid bruits LYMPHATICS: No lymphadenopathy CARDIAC: RRR, no murmurs, rubs, gallops RESPIRATORY:  Clear to auscultation without rales, wheezing or rhonchi  ABDOMEN: Soft, non-tender, non-distended MUSCULOSKELETAL:  No edema; No deformity  SKIN: Warm and dry NEUROLOGIC:  Alert and oriented x 3 PSYCHIATRIC:  Normal affect   ASSESSMENT:    1. Chest pain, unspecified type   2. Diabetes mellitus with coincident hypertension (HCC)   3. Other hyperlipidemia    PLAN:    In order of problems listed  above:  Chest Pain - The patient presents with possibly cardiac - Would recommend exercise nuclear medicine stress test (NPO at midnight); discussed risks, benefits, and alternatives of the diagnostic procedure including chest pain, arrhythmia, and death.  Patient amenable for testing. - if positive, discussed risks and benefits of cardiac catheterization have been discussed with the patient.  These include bleeding, infection, kidney damage, stroke, heart attack, death.  The patient understands these risks and is willing to proceed if necessary  Diabetes with hypertension - ambulatory blood pressure not done, will start ambulatory BP monitoring; gave education on how to perform ambulatory blood pressure monitoring including the frequency and  technique; goal ambulatory blood pressure < 135/85 on average - SHARED DECISION MAKING: patient wished to defer medication addition at this time, will work on weight loss and if she still does not meet goal BP will trial medical management, continue home medications;  - discussed diet (DASH/low sodium), and exercise/weight loss interventions   Hyperlipidemia -LDL goal less than 100 -continue current statin - gave education on dietary changes  Shared Decision Making/Informed Consent The risks [chest pain, shortness of breath, cardiac arrhythmias, dizziness, blood pressure fluctuations, myocardial infarction, stroke/transient ischemic attack, nausea, vomiting, allergic reaction, radiation exposure, metallic taste sensation and life-threatening complications (estimated to be 1 in 10,000)], benefits (risk stratification, diagnosing coronary artery disease, treatment guidance) and alternatives of a nuclear stress test were discussed in detail with Meghan Alvarez and she agrees to proceed.    Medication Adjustments/Labs and Tests Ordered: Current medicines are reviewed at length with the patient today.  Concerns regarding medicines are outlined above.  Orders  Placed This Encounter  Procedures  . MYOCARDIAL PERFUSION IMAGING  . EKG 12-Lead   No orders of the defined types were placed in this encounter.   Patient Instructions  Medication Instructions:  Your physician recommends that you continue on your current medications as directed. Please refer to the Current Medication list given to you today. *If you need a refill on your cardiac medications before your next appointment, please call your pharmacy*   Lab Work: none If you have labs (blood work) drawn today and your tests are completely normal, you will receive your results only by: Marland Kitchen MyChart Message (if you have MyChart) OR . A paper copy in the mail If you have any lab test that is abnormal or we need to change your treatment, we will call you to review the results.   Testing/Procedures:   Your physician has requested that you have an exercise stress myoview. For further information please visit [email or web address] Please follow instruction sheet, as given. You will need covid testing prior to this test  Follow-Up: At [facility], you and your health needs are our priority.  As part of our continuing mission to provide you with exceptional heart care, we have created designated Provider Care Teams.  These Care Teams include your primary Cardiologist (physician) and Advanced Practice Providers (APPs -  Physician Assistants and Nurse Practitioners) who all work together to provide you with the care you need, when you need it.  We recommend signing up for the patient portal called "MyChart".  Sign up information is provided on this After Visit Summary.  MyChart is used to connect with patients for Virtual Visits (Telemedicine).  Patients are able to view lab/test results, encounter notes, upcoming appointments, etc.  Non-urgent messages can be sent to your provider as well.   To learn more about what you can do with MyChart, go to [email or web address].    Your next appointment:    3 months The format for your next appointment:   In Person  Provider:   You may see Christell Constant, MD or one of the following Advanced Practice Providers on your designated Care Team:    Ronie Spies, PA-C  Jacolyn Reedy, PA-C    Other Instructions Please check blood pressure 2-3 times per week at home and keep record of readings  Due to recent COVID-19 restrictions implemented by our local and state authorities and in an effort to keep both patients and staff as safe as possible, our hospital system requires COVID-19 testing prior to certain scheduled  hospital procedures.  Please go to [phone]. [place], [place] [address]    This is a drive up testing site.  You will not need to exit your vehicle.  You will not be billed at the time of testing but may receive a bill later depending on your insurance. You must agree to self-quarantine from the time of your testing until the procedure date  This should included staying home with ONLY the people you live with.  Avoid take-out, grocery store shopping or leaving the house for any non-emergent reason.  Failure to have your COVID-19 test done on the date and time you have been scheduled will result in cancellation of your procedure.  Please call our office at [phone] if you have any questions.      Signed, Christell Constant, MD  01/19/2020 4:26 PM    [facility]

## 2020-01-19 NOTE — Patient Instructions (Addendum)
Medication Instructions:  Your physician recommends that you continue on your current medications as directed. Please refer to the Current Medication list given to you today. *If you need a refill on your cardiac medications before your next appointment, please call your pharmacy*   Lab Work: none If you have labs (blood work) drawn today and your tests are completely normal, you will receive your results only by: Marland Kitchen MyChart Message (if you have MyChart) OR . A paper copy in the mail If you have any lab test that is abnormal or we need to change your treatment, we will call you to review the results.   Testing/Procedures:   Your physician has requested that you have an exercise stress myoview. For further information please visit [email or web address] Please follow instruction sheet, as given. You will need covid testing prior to this test  Follow-Up: At [facility], you and your health needs are our priority.  As part of our continuing mission to provide you with exceptional heart care, we have created designated Provider Care Teams.  These Care Teams include your primary Cardiologist (physician) and Advanced Practice Providers (APPs -  Physician Assistants and Nurse Practitioners) who all work together to provide you with the care you need, when you need it.  We recommend signing up for the patient portal called "MyChart".  Sign up information is provided on this After Visit Summary.  MyChart is used to connect with patients for Virtual Visits (Telemedicine).  Patients are able to view lab/test results, encounter notes, upcoming appointments, etc.  Non-urgent messages can be sent to your provider as well.   To learn more about what you can do with MyChart, go to [email or web address].    Your next appointment:   3 months The format for your next appointment:   In Person  Provider:   You may see Christell Constant, MD or one of the following Advanced Practice Providers on your  designated Care Team:    Ronie Spies, PA-C  Jacolyn Reedy, PA-C    Other Instructions Please check blood pressure 2-3 times per week at home and keep record of readings  Due to recent COVID-19 restrictions implemented by our local and state authorities and in an effort to keep both patients and staff as safe as possible, our hospital system requires COVID-19 testing prior to certain scheduled hospital procedures.  Please go to [phone]. [place], [place] [address]    This is a drive up testing site.  You will not need to exit your vehicle.  You will not be billed at the time of testing but may receive a bill later depending on your insurance. You must agree to self-quarantine from the time of your testing until the procedure date  This should included staying home with ONLY the people you live with.  Avoid take-out, grocery store shopping or leaving the house for any non-emergent reason.  Failure to have your COVID-19 test done on the date and time you have been scheduled will result in cancellation of your procedure.  Please call our office at [phone] if you have any questions.

## 2020-01-24 NOTE — Progress Notes (Signed)
Office Visit Note  Patient: Meghan Alvarez             Date of Birth: Jun 09, 1934           MRN: [id number]             PCP: Catha Gosselin, MD Referring: Catha Gosselin, MD Visit Date: 02/07/2020 Occupation: @GUAROCC @  Subjective:  Pain in multiple joints.   History of Present Illness: Meghan Alvarez is a 86 y.o. female with history of osteoarthritis and myofascial pain syndrome.  She states she was recently seen by cardiologist for chest pain.  Her stress test was negative.  The symptoms have improved.  She continues to have some generalized pain from fibromyalgia.  She is also seeing Dr. 91 for right carpal tunnel syndrome and will require surgery in the future.  She continues to have some lower back pain for which she has been followed by Dr. Otelia Sergeant and Dr. Otelia Sergeant.  She states her blood pressure has been elevated lately.  She was seen by the cardiologist who recommended going on the antihypertensive medication.  Activities of Daily Living:  Patient reports morning stiffness for 1  hour.   Patient Reports nocturnal pain.  Difficulty dressing/grooming: Denies Difficulty climbing stairs: Denies Difficulty getting out of chair: Denies Difficulty using hands for taps, buttons, cutlery, and/or writing: Reports  Review of Systems  Constitutional: Positive for fatigue.  HENT: Negative for mouth sores, mouth dryness and nose dryness.   Eyes: Negative for pain, itching and dryness.  Respiratory: Negative for shortness of breath and difficulty breathing.   Cardiovascular: Positive for chest pain and palpitations.       Recent stress test  Gastrointestinal: Positive for constipation. Negative for blood in stool and diarrhea.  Endocrine: Negative for increased urination.  Genitourinary: Negative for difficulty urinating.  Musculoskeletal: Positive for arthralgias, joint pain, joint swelling, myalgias, morning stiffness, muscle tenderness and myalgias.  Skin: Negative for color change,  rash and redness.  Allergic/Immunologic: Negative for susceptible to infections.  Neurological: Positive for numbness and parasthesias.  Hematological: Negative for swollen glands.  Psychiatric/Behavioral: Negative for sleep disturbance.    PMFS History:  Patient Active Problem List   Diagnosis Date Noted  . Chest pain 01/19/2020  . Diabetes mellitus with coincident hypertension (HCC) 01/19/2020  . Other hyperlipidemia 01/19/2020  . Myofascial pain syndrome 02/04/2016  . Spondylosis of lumbar region without myelopathy or radiculopathy 02/04/2016  . DJD (degenerative joint disease), cervical 02/04/2016  . Age-related osteoporosis without current pathological fracture 02/04/2016  . Vitamin D deficiency 02/04/2016  . S/P TKR (total knee replacement) using cement 03/30/2014    Past Medical History:  Diagnosis Date  . Arthritis   . Complication of anesthesia   . Diabetes mellitus without complication (HCC)   . Fibromyalgia   . PONV (postoperative nausea and vomiting)     Family History  Problem Relation Age of Onset  . Cancer Sister        breast  . Diabetes Brother    Past Surgical History:  Procedure Laterality Date  . APPENDECTOMY  04   ruptured   . CERVICAL DISCECTOMY  91  . DILATION AND CURETTAGE OF UTERUS  05  . FRACTURE SURGERY Left 06   arm  . JOINT REPLACEMENT Left 08   knee repl  . SHOULDER ARTHROSCOPY Right 2001  . SHOULDER ARTHROSCOPY Left 07  . TONSILLECTOMY    . TOTAL KNEE ARTHROPLASTY Right 03/30/2014   DR NORRIS  . TOTAL KNEE  ARTHROPLASTY Right 03/30/2014   Procedure: RIGHT TOTAL KNEE ARTHROPLASTY;  Surgeon: Beverely Low, MD;  Location: [facility] OR;  Service: Orthopedics;  Laterality: Right;   Social History   Social History Narrative  . Not on file   Immunization History  Administered Date(s) Administered  . Influenza, High Dose Seasonal PF 10/16/2018  . Moderna Sars-Covid-2 Vaccination 01/23/2019, 02/20/2019, 11/07/2019  . Zoster Recombinat  (Shingrix) 11/24/2018     Objective: Vital Signs: BP (!) 165/80 (BP Location: Left Arm, Patient Position: Sitting, Cuff Size: Normal)   Pulse 76   Resp 16   Ht 5\' 5"  (1.651 m)   Wt 183 lb 9.6 oz (83.3 kg)   BMI 30.55 kg/m    Physical Exam Vitals and nursing note reviewed.  Constitutional:      Appearance: She is well-developed and well-nourished.  HENT:     Head: Normocephalic and atraumatic.  Eyes:     Extraocular Movements: EOM normal.     Conjunctiva/sclera: Conjunctivae normal.  Cardiovascular:     Rate and Rhythm: Normal rate and regular rhythm.     Pulses: Intact distal pulses.     Heart sounds: Normal heart sounds.  Pulmonary:     Effort: Pulmonary effort is normal.     Breath sounds: Normal breath sounds.  Abdominal:     General: Bowel sounds are normal.     Palpations: Abdomen is soft.  Musculoskeletal:     Cervical back: Normal range of motion.  Lymphadenopathy:     Cervical: No cervical adenopathy.  Skin:    General: Skin is warm and dry.     Capillary Refill: Capillary refill takes less than 2 seconds.  Neurological:     Mental Status: She is alert and oriented to person, place, and time.  Psychiatric:        Mood and Affect: Mood and affect normal.        Behavior: Behavior normal.      Musculoskeletal Exam: She had good range of motion of her cervical spine.  She had no discomfort on palpation of her thoracic or lumbar spine.  Shoulder joints, elbow joints, wrist joints with good range of motion.  She has DIP and PIP prominence but no synovitis.  Hip joints and knee joints with good range of motion.  She had no tenderness over ankles or MTPs.  CDAI Exam: CDAI Score: -- Patient Global: --; Provider Global: -- Swollen: --; Tender: -- Joint Exam 02/07/2020   No joint exam has been documented for this visit   There is currently no information documented on the homunculus. Go to the Rheumatology activity and complete the homunculus joint  exam.  Investigation: No additional findings.  Imaging: MYOCARDIAL PERFUSION IMAGING  Result Date: 02/02/2020  The left ventricular ejection fraction is hyperdynamic (>65%).  Nuclear stress EF: 72%. No wall motion abnormalities.  There was no ST segment deviation noted during stress.  The study is normal. There is no evidence of ischemia or infarction.  This is a low risk study.  02/04/2020, MD   Recent Labs: Lab Results  Component Value Date   WBC 7.8 08/17/2017   HGB 14.0 08/17/2017   PLT 209 08/17/2017   NA 139 08/17/2017   K 4.0 08/17/2017   CL 102 08/17/2017   CO2 29 08/17/2017   GLUCOSE 90 08/17/2017   BUN 13 08/17/2017   CREATININE 0.99 08/17/2017   BILITOT 0.5 08/17/2017   ALKPHOS 62 08/17/2017   AST 33 08/17/2017   ALT 33 08/17/2017  PROT 7.7 08/17/2017   ALBUMIN 4.5 08/17/2017   CALCIUM 9.8 08/17/2017   GFRAA >60 08/17/2017    Speciality Comments: No specialty comments available.  Procedures:  No procedures performed Allergies: Azithromycin and Codeine   Assessment / Plan:     Visit Diagnoses: Myofascial pain syndrome -she continues to have some generalized pain and discomfort.  She takes Robaxin 500 mg, 1 tablet on daily basis in the afternoon.  I have advised her to use it on as needed basis.  Side effect of muscle relaxers were discussed.  She takes Flexeril only occasionally.  I have advised her to discontinue Flexeril.  Interaction of Flexeril with tramadol and Cymbalta was also emphasized.  Trapezius muscle spasm-she had bilateral trapezius spasm.  We decided not to give her trigger point injections due to elevated blood pressure.  Patient will contact me for injections once her blood pressure is normalized.  DDD (degenerative disc disease), cervical - referred to Dr. Otelia Sergeant.   DDD (degenerative disc disease), lumbar-she has chronic lower back pain.  Status post total bilateral knee replacement using cement-she continues to have some discomfort  in her knee joints.  Age-related osteoporosis without current pathological fracture - DEXA was ordered by PCP.  According to the patient she was advised to discontinue the use of Fosamax since she had been taking it for over 5 years.  History of vitamin D deficiency  Essential hypertension-blood pressure is elevated.  She wanted my opinion regarding the treatment of hypertension.  I was in agreement with a cardiologist that she needs to be on a hypertensive agent.  She will contact the cardiology office.  I also advised her to discontinue meloxicam.  Patient believes that she has some underlying kidney disease.  I do not have any recent lab results on her.  Educated about COVID-19 virus infection-she is fully vaccinated against COVID-19.  She also received a booster.  Use of mask, social distancing and hand hygiene was discussed.  Orders: No orders of the defined types were placed in this encounter.  No orders of the defined types were placed in this encounter.   Face-to-face time spent with patient was 30 minutes. Greater than 50% of time was spent in counseling and coordination of care.  Follow-Up Instructions: Return in about 6 months (around 08/06/2020) for MFPS,DDD.   Pollyann Savoy, MD  Note - This record has been created using Animal nutritionist.  Chart creation errors have been sought, but may not always  have been located. Such creation errors do not reflect on  the standard of medical care.

## 2020-01-26 ENCOUNTER — Telehealth (HOSPITAL_COMMUNITY): Payer: Self-pay | Admitting: *Deleted

## 2020-01-26 NOTE — Telephone Encounter (Signed)
Patient given detailed instructions per Myocardial Perfusion Study Information Sheet for the test on 02/02/20 at 1045. Patient notified to arrive 15 minutes early and that it is imperative to arrive on time for appointment to keep from having the test rescheduled.  If you need to cancel or reschedule your appointment, please call the office within 24 hours of your appointment. . Patient verbalized understanding.Meghan Alvarez

## 2020-01-31 ENCOUNTER — Other Ambulatory Visit (HOSPITAL_COMMUNITY)
Admission: RE | Admit: 2020-01-31 | Discharge: 2020-01-31 | Disposition: A | Discharge status: Home / Self Care | Payer: Medicare Other | Source: Ambulatory Visit | Attending: Internal Medicine | Admitting: Internal Medicine

## 2020-01-31 DIAGNOSIS — Z01812 Encounter for preprocedural laboratory examination: Secondary | ICD-10-CM | POA: Diagnosis not present

## 2020-01-31 DIAGNOSIS — Z20822 Contact with and (suspected) exposure to covid-19: Secondary | ICD-10-CM | POA: Insufficient documentation

## 2020-01-31 LAB — SARS CORONAVIRUS 2 (TAT 6-24 HRS): SARS Coronavirus 2: NEGATIVE

## 2020-02-01 DIAGNOSIS — E119 Type 2 diabetes mellitus without complications: Secondary | ICD-10-CM | POA: Diagnosis not present

## 2020-02-01 DIAGNOSIS — G8929 Other chronic pain: Secondary | ICD-10-CM | POA: Diagnosis not present

## 2020-02-01 DIAGNOSIS — E118 Type 2 diabetes mellitus with unspecified complications: Secondary | ICD-10-CM | POA: Diagnosis not present

## 2020-02-01 DIAGNOSIS — E785 Hyperlipidemia, unspecified: Secondary | ICD-10-CM | POA: Diagnosis not present

## 2020-02-01 DIAGNOSIS — M810 Age-related osteoporosis without current pathological fracture: Secondary | ICD-10-CM | POA: Diagnosis not present

## 2020-02-01 DIAGNOSIS — N183 Chronic kidney disease, stage 3 unspecified: Secondary | ICD-10-CM | POA: Diagnosis not present

## 2020-02-01 DIAGNOSIS — M189 Osteoarthritis of first carpometacarpal joint, unspecified: Secondary | ICD-10-CM | POA: Diagnosis not present

## 2020-02-01 DIAGNOSIS — J45909 Unspecified asthma, uncomplicated: Secondary | ICD-10-CM | POA: Diagnosis not present

## 2020-02-01 DIAGNOSIS — E7800 Pure hypercholesterolemia, unspecified: Secondary | ICD-10-CM | POA: Diagnosis not present

## 2020-02-02 ENCOUNTER — Other Ambulatory Visit: Payer: Self-pay

## 2020-02-02 ENCOUNTER — Ambulatory Visit (HOSPITAL_COMMUNITY): Payer: Medicare Other | Attending: Cardiology

## 2020-02-02 DIAGNOSIS — R079 Chest pain, unspecified: Secondary | ICD-10-CM | POA: Insufficient documentation

## 2020-02-02 LAB — MYOCARDIAL PERFUSION IMAGING
Estimated workload: 7 METS
Exercise duration (min): 6 min
Exercise duration (sec): 0 s
LV dias vol: 42 mL (ref 46–106)
LV sys vol: 12 mL
MPHR: 137 {beats}/min
Peak HR: 137 {beats}/min
Percent HR: 100 %
Rest HR: 73 {beats}/min
SDS: 5
SRS: 0
SSS: 5
TID: 0.83

## 2020-02-02 MED ORDER — TECHNETIUM TC 99M TETROFOSMIN IV KIT
30.6000 | PACK | Freq: Once | INTRAVENOUS | Status: AC | PRN
  Administered 2020-02-02: 30.6 via INTRAVENOUS
  Filled 2020-02-02: qty 31

## 2020-02-02 MED ORDER — TECHNETIUM TC 99M TETROFOSMIN IV KIT
9.9000 | PACK | Freq: Once | INTRAVENOUS | Status: AC | PRN
  Administered 2020-02-02: 9.9 via INTRAVENOUS
  Filled 2020-02-02: qty 10

## 2020-02-02 MED ORDER — REGADENOSON 0.4 MG/5ML IV SOLN: 0.4000 mg | Freq: Once | INTRAVENOUS | Status: AC

## 2020-02-07 ENCOUNTER — Other Ambulatory Visit: Payer: Self-pay

## 2020-02-07 ENCOUNTER — Encounter: Payer: Self-pay | Admitting: Rheumatology

## 2020-02-07 ENCOUNTER — Ambulatory Visit: Payer: Medicare Other | Admitting: Rheumatology

## 2020-02-07 VITALS — BP 165/80 | HR 76 | Resp 16 | Ht 65.0 in | Wt 183.6 lb

## 2020-02-07 DIAGNOSIS — M7918 Myalgia, other site: Secondary | ICD-10-CM | POA: Diagnosis not present

## 2020-02-07 DIAGNOSIS — M62838 Other muscle spasm: Secondary | ICD-10-CM

## 2020-02-07 DIAGNOSIS — I10 Essential (primary) hypertension: Secondary | ICD-10-CM

## 2020-02-07 DIAGNOSIS — M5030 Other cervical disc degeneration, unspecified cervical region: Secondary | ICD-10-CM

## 2020-02-07 DIAGNOSIS — Z96653 Presence of artificial knee joint, bilateral: Secondary | ICD-10-CM

## 2020-02-07 DIAGNOSIS — M5136 Other intervertebral disc degeneration, lumbar region: Secondary | ICD-10-CM | POA: Diagnosis not present

## 2020-02-07 DIAGNOSIS — M810 Age-related osteoporosis without current pathological fracture: Secondary | ICD-10-CM | POA: Diagnosis not present

## 2020-02-07 DIAGNOSIS — Z8639 Personal history of other endocrine, nutritional and metabolic disease: Secondary | ICD-10-CM | POA: Diagnosis not present

## 2020-02-15 ENCOUNTER — Ambulatory Visit: Payer: Medicare Other | Admitting: Specialist

## 2020-02-15 DIAGNOSIS — H02052 Trichiasis without entropian right lower eyelid: Secondary | ICD-10-CM | POA: Diagnosis not present

## 2020-02-15 DIAGNOSIS — H5203 Hypermetropia, bilateral: Secondary | ICD-10-CM | POA: Diagnosis not present

## 2020-02-15 DIAGNOSIS — H2513 Age-related nuclear cataract, bilateral: Secondary | ICD-10-CM | POA: Diagnosis not present

## 2020-02-15 DIAGNOSIS — H524 Presbyopia: Secondary | ICD-10-CM | POA: Diagnosis not present

## 2020-02-22 ENCOUNTER — Encounter: Payer: Self-pay | Admitting: Specialist

## 2020-02-22 ENCOUNTER — Other Ambulatory Visit: Payer: Self-pay

## 2020-02-22 ENCOUNTER — Ambulatory Visit: Payer: Medicare Other | Admitting: Specialist

## 2020-02-22 VITALS — BP 142/68 | HR 82 | Ht 65.0 in | Wt 184.0 lb

## 2020-02-22 DIAGNOSIS — R200 Anesthesia of skin: Secondary | ICD-10-CM | POA: Diagnosis not present

## 2020-02-22 DIAGNOSIS — M5030 Other cervical disc degeneration, unspecified cervical region: Secondary | ICD-10-CM | POA: Diagnosis not present

## 2020-02-22 DIAGNOSIS — M19041 Primary osteoarthritis, right hand: Secondary | ICD-10-CM

## 2020-02-22 DIAGNOSIS — M4722 Other spondylosis with radiculopathy, cervical region: Secondary | ICD-10-CM | POA: Diagnosis not present

## 2020-02-22 NOTE — Progress Notes (Signed)
Office Visit Note   Patient: Meghan Alvarez           Date of Birth: May 11, 1934           MRN: [id number] Visit Date: 02/22/2020              Requested by: Catha Gosselin, MD [address] [place],  [place] [address] PCP: Catha Gosselin, MD   Assessment & Plan: Visit Diagnoses:  1. Numbness of right hand   2. Other spondylosis with radiculopathy, cervical region   3. Degenerative disc disease, cervical   4. Primary osteoarthritis, right hand     Plan: Carpal Tunnel Syndrome  Carpal tunnel syndrome is a condition that causes pain in your hand and arm. The carpal tunnel is a narrow area located on the palm side of your wrist. Repeated wrist motion or certain diseases may cause swelling within the tunnel. This swelling pinches the main nerve in the wrist (median nerve). What are the causes? This condition may be caused by:  Repeated wrist motions.  Wrist injuries.  Arthritis.  A cyst or tumor in the carpal tunnel.  Fluid buildup during pregnancy. Sometimes the cause of this condition is not known. What increases the risk? This condition is more likely to develop in:  People who have jobs that cause them to repeatedly move their wrists in the same motion, such as Health visitor.  Women.  People with certain conditions, such as: ? Diabetes. ? Obesity. ? An underactive thyroid (hypothyroidism). ? Kidney failure. What are the signs or symptoms? Symptoms of this condition include:  A tingling feeling in your fingers, especially in your thumb, index, and middle fingers.  Tingling or numbness in your hand.  An aching feeling in your entire arm, especially when your wrist and elbow are bent for long periods of time.  Wrist pain that goes up your arm to your shoulder.  Pain that goes down into your palm or fingers.  A weak feeling in your hands. You may have trouble grabbing and holding items. Your symptoms may feel worse during the night. How is this  diagnosed? This condition is diagnosed with a medical history and physical exam. You may also have tests, including:  An electromyogram (EMG). This test measures electrical signals sent by your nerves into the muscles.  X-rays. How is this treated? Treatment for this condition includes:  Lifestyle changes. It is important to stop doing or modify the activity that caused your condition.  Physical or occupational therapy.  Medicines for pain and inflammation. This may include medicine that is injected into your wrist.  A wrist splint.  Surgery. Follow these instructions at home: If you have a splint:   Wear it as told by your health care provider. Remove it only as told by your health care provider.  Loosen the splint if your fingers become numb and tingle, or if they turn cold and blue.  Keep the splint clean and dry. General instructions   Take over-the-counter and prescription medicines only as told by your health care provider.  Rest your wrist from any activity that may be causing your pain. If your condition is work related, talk to your employer about changes that can be made, such as getting a wrist pad to use while typing.  If directed, apply ice to the painful area: ? Put ice in a plastic bag. ? Place a towel between your skin and the bag. ? Leave the ice on for 20 minutes,  2-3 times per day.  Keep all follow-up visits as told by your health care provider. This is important.  Do any exercises as told by your health care provider, physical therapist, or occupational therapist. Contact a health care provider if:  You have new symptoms.  Your pain is not controlled with medicines.  Your symptoms get worse. This information is not intended to replace advice given to you by your health care provider. Make sure you discuss any questions you have with your health care provider. Document Released: 12/27/1999 Document Revised: 05/09/2015 Document Reviewed:  09/09/2016 Elsevier Interactive Patient Education  2017 [employer].  Follow-Up Instructions: Return in about 2 months (around 04/21/2020).   Orders:  No orders of the defined types were placed in this encounter.  No orders of the defined types were placed in this encounter.     Procedures: No procedures performed   Clinical Data: No additional findings.   Subjective: Chief Complaint  Patient presents with  . Neck - Follow-up  . Right Wrist - Follow-up    86 year old right handed female with right CTS she is using the wrist splint at night and when using the hand during the day. Using diclofenac gel. The right hand feels better with the splint in Place. Over all feeling better.   Review of Systems  Constitutional: Negative.   HENT: Positive for sore throat.   Eyes: Negative.   Respiratory: Negative.   Cardiovascular: Negative.   Gastrointestinal: Negative.   Genitourinary: Negative.   Musculoskeletal: Positive for neck pain and neck stiffness.  Skin: Negative.   Allergic/Immunologic: Negative.   Neurological: Positive for weakness and numbness.  Hematological: Negative.   Psychiatric/Behavioral: Negative.      Objective: Vital Signs: BP (!) 142/68 (BP Location: Left Arm, Patient Position: Sitting)   Pulse 82   Ht 5\' 5"  (1.651 m)   Wt 184 lb (83.5 kg)   BMI 30.62 kg/m   Physical Exam  Right Hand Exam   Tenderness  The patient is experiencing tenderness in the radial area.  Tests  Phalen's Sign: negative Tinel's sign (median nerve): negative Finkelstein's test: negative      Specialty Comments:  No specialty comments available.  Imaging: No results found.   PMFS History: Patient Active Problem List   Diagnosis Date Noted  . Chest pain 01/19/2020  . Diabetes mellitus with coincident hypertension (HCC) 01/19/2020  . Other hyperlipidemia 01/19/2020  . Myofascial pain syndrome 02/04/2016  . Spondylosis of lumbar region without  myelopathy or radiculopathy 02/04/2016  . DJD (degenerative joint disease), cervical 02/04/2016  . Age-related osteoporosis without current pathological fracture 02/04/2016  . Vitamin D deficiency 02/04/2016  . S/P TKR (total knee replacement) using cement 03/30/2014   Past Medical History:  Diagnosis Date  . Arthritis   . Complication of anesthesia   . Diabetes mellitus without complication (HCC)   . Fibromyalgia   . PONV (postoperative nausea and vomiting)     Family History  Problem Relation Age of Onset  . Cancer Sister        breast  . Diabetes Brother     Past Surgical History:  Procedure Laterality Date  . APPENDECTOMY  04   ruptured   . CERVICAL DISCECTOMY  91  . DILATION AND CURETTAGE OF UTERUS  05  . FRACTURE SURGERY Left 06   arm  . JOINT REPLACEMENT Left 08   knee repl  . SHOULDER ARTHROSCOPY Right 2001  . SHOULDER ARTHROSCOPY Left 07  . TONSILLECTOMY    .  TOTAL KNEE ARTHROPLASTY Right 03/30/2014   DR NORRIS  . TOTAL KNEE ARTHROPLASTY Right 03/30/2014   Procedure: RIGHT TOTAL KNEE ARTHROPLASTY;  Surgeon: Beverely Low, MD;  Location: [facility];  Service: Orthopedics;  Laterality: Right;   Social History   Occupational History  . Not on file  Tobacco Use  . Smoking status: Never Smoker  . Smokeless tobacco: Never Used  Vaping Use  . Vaping Use: Never used  Substance and Sexual Activity  . Alcohol use: Yes    Comment: RARELY  . Drug use: Never  . Sexual activity: Not on file

## 2020-02-22 NOTE — Patient Instructions (Signed)
Carpal Tunnel Syndrome  Carpal tunnel syndrome is a condition that causes pain in your hand and arm. The carpal tunnel is a narrow area located on the palm side of your wrist. Repeated wrist motion or certain diseases may cause swelling within the tunnel. This swelling pinches the main nerve in the wrist (median nerve). What are the causes? This condition may be caused by:  Repeated wrist motions.  Wrist injuries.  Arthritis.  A cyst or tumor in the carpal tunnel.  Fluid buildup during pregnancy. Sometimes the cause of this condition is not known. What increases the risk? This condition is more likely to develop in:  People who have jobs that cause them to repeatedly move their wrists in the same motion, such as butchers and cashiers.  Women.  People with certain conditions, such as: ? Diabetes. ? Obesity. ? An underactive thyroid (hypothyroidism). ? Kidney failure. What are the signs or symptoms? Symptoms of this condition include:  A tingling feeling in your fingers, especially in your thumb, index, and middle fingers.  Tingling or numbness in your hand.  An aching feeling in your entire arm, especially when your wrist and elbow are bent for long periods of time.  Wrist pain that goes up your arm to your shoulder.  Pain that goes down into your palm or fingers.  A weak feeling in your hands. You may have trouble grabbing and holding items. Your symptoms may feel worse during the night. How is this diagnosed? This condition is diagnosed with a medical history and physical exam. You may also have tests, including:  An electromyogram (EMG). This test measures electrical signals sent by your nerves into the muscles.  X-rays. How is this treated? Treatment for this condition includes:  Lifestyle changes. It is important to stop doing or modify the activity that caused your condition.  Physical or occupational therapy.  Medicines for pain and inflammation. This may  include medicine that is injected into your wrist.  A wrist splint.  Surgery. Follow these instructions at home: If you have a splint:   Wear it as told by your health care provider. Remove it only as told by your health care provider.  Loosen the splint if your fingers become numb and tingle, or if they turn cold and blue.  Keep the splint clean and dry. General instructions   Take over-the-counter and prescription medicines only as told by your health care provider.  Rest your wrist from any activity that may be causing your pain. If your condition is work related, talk to your employer about changes that can be made, such as getting a wrist pad to use while typing.  If directed, apply ice to the painful area: ? Put ice in a plastic bag. ? Place a towel between your skin and the bag. ? Leave the ice on for 20 minutes, 2-3 times per day.  Keep all follow-up visits as told by your health care provider. This is important.  Do any exercises as told by your health care provider, physical therapist, or occupational therapist. Contact a health care provider if:  You have new symptoms.  Your pain is not controlled with medicines.  Your symptoms get worse. This information is not intended to replace advice given to you by your health care provider. Make sure you discuss any questions you have with your health care provider. Document Released: 12/27/1999 Document Revised: 05/09/2015 Document Reviewed: 09/09/2016 Elsevier Interactive Patient Education  2017 [employer] Inc[employer] 

## 2020-02-29 ENCOUNTER — Ambulatory Visit: Payer: Medicare Other | Admitting: Specialist

## 2020-03-02 IMAGING — CR DG CHEST 2V
2 series · 2 of 2 positions shown · non-contrast
Comparison: 03/22/2014

CLINICAL DATA: Intermittent left-sided chest pain for 2 months.

EXAM:
CHEST - 2 VIEW

[w chest pa]
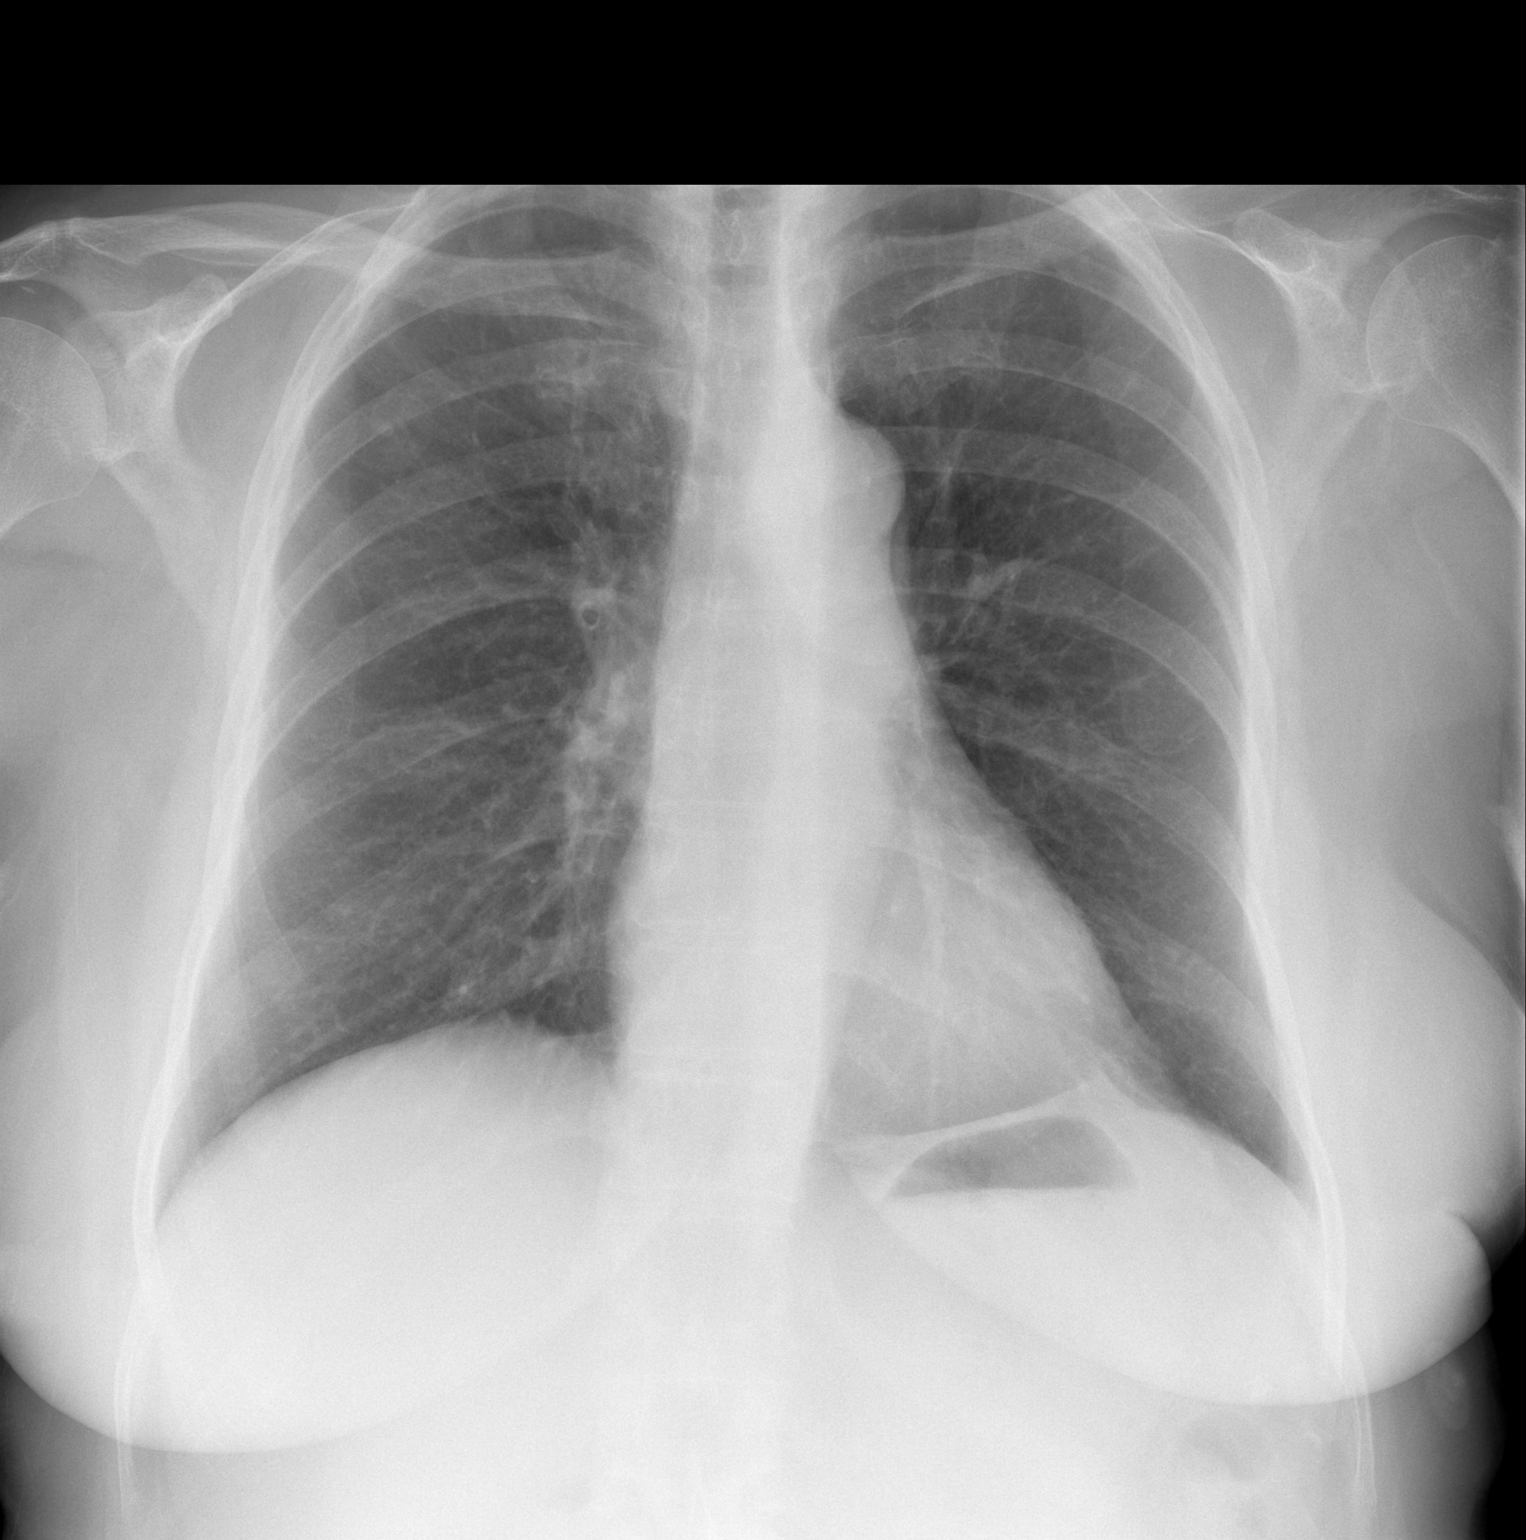

[w chest lat]
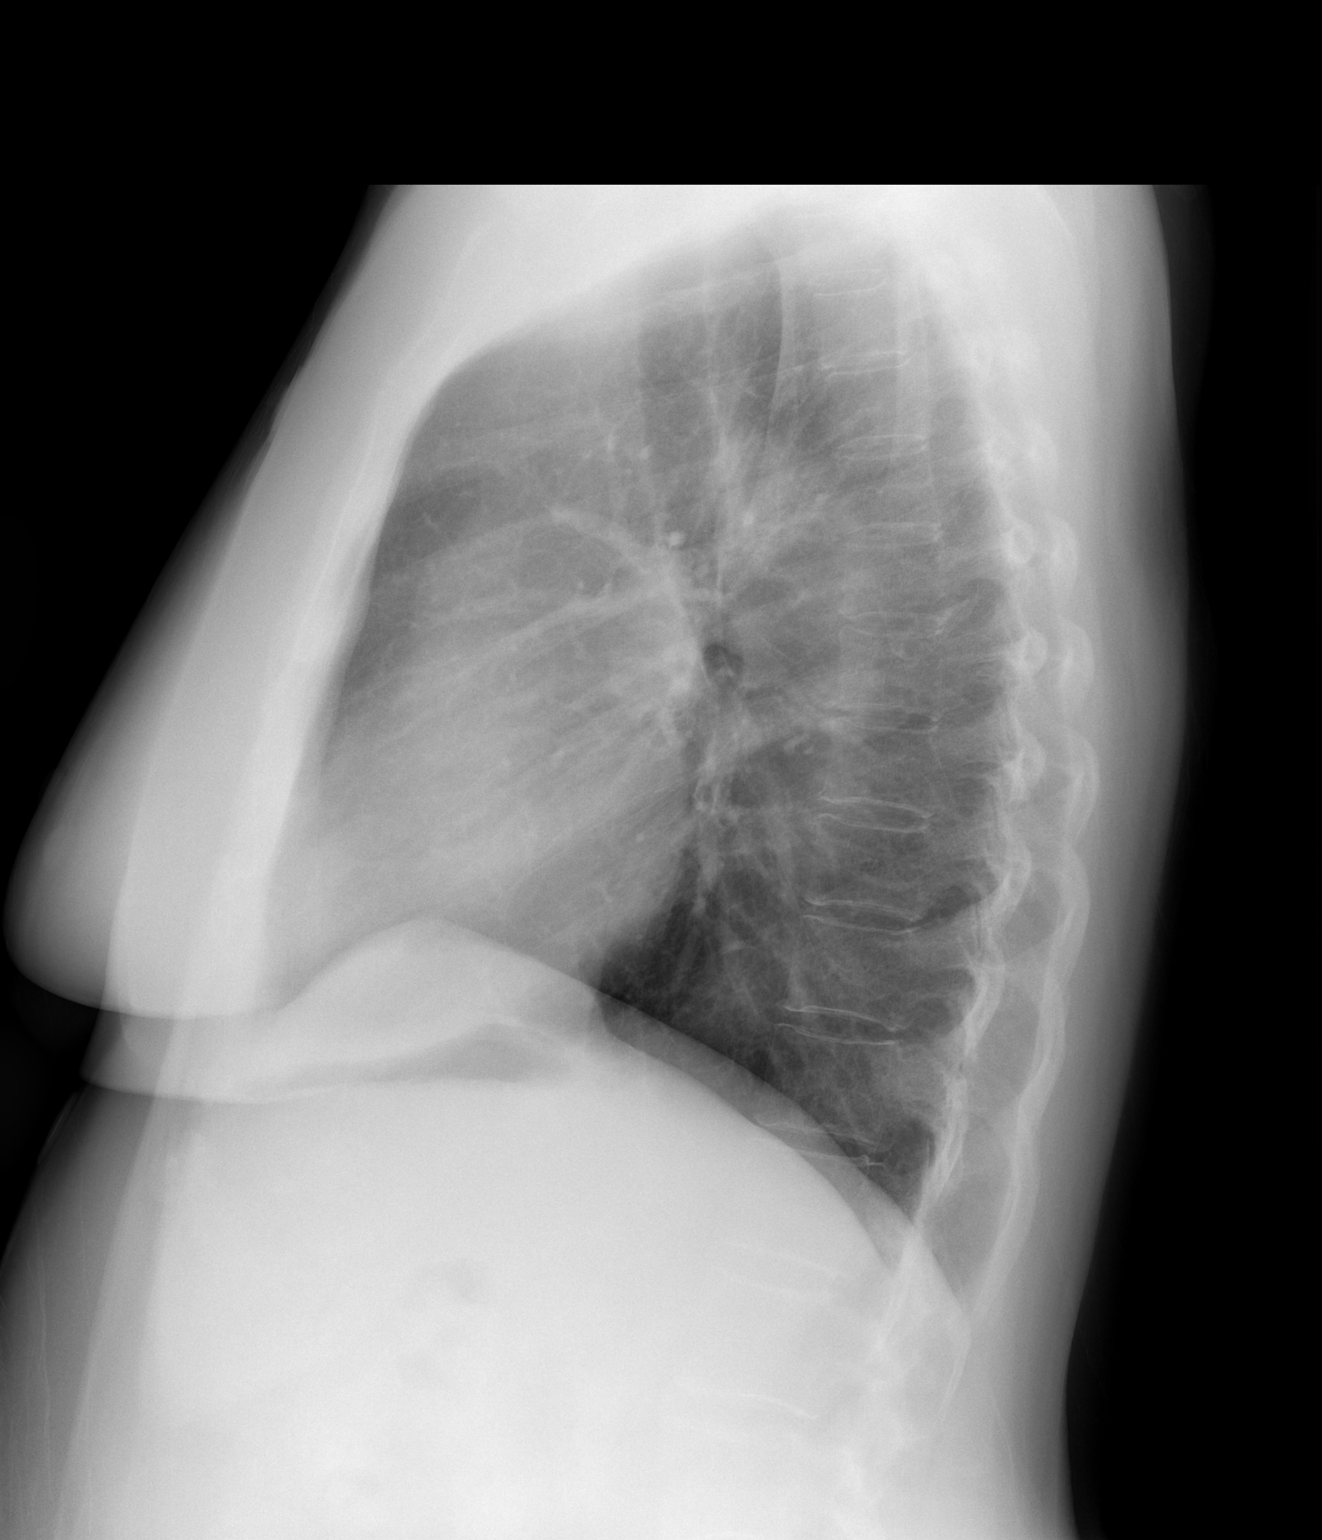

[2 of 2 positions shown; findings below may reference images not displayed]

FINDINGS: The heart size and mediastinal contours are within normal limits.
Both lungs are clear. The visualized skeletal structures are
unremarkable.
IMPRESSION: No active cardiopulmonary disease.

## 2020-03-06 ENCOUNTER — Encounter: Payer: Self-pay | Admitting: Occupational Therapy

## 2020-03-06 ENCOUNTER — Other Ambulatory Visit: Payer: Self-pay

## 2020-03-06 ENCOUNTER — Ambulatory Visit: Payer: Medicare Other | Attending: Specialist | Admitting: Occupational Therapy

## 2020-03-06 DIAGNOSIS — R202 Paresthesia of skin: Secondary | ICD-10-CM | POA: Insufficient documentation

## 2020-03-06 DIAGNOSIS — G5601 Carpal tunnel syndrome, right upper limb: Secondary | ICD-10-CM | POA: Diagnosis not present

## 2020-03-06 DIAGNOSIS — M6281 Muscle weakness (generalized): Secondary | ICD-10-CM | POA: Diagnosis not present

## 2020-03-06 DIAGNOSIS — M25541 Pain in joints of right hand: Secondary | ICD-10-CM | POA: Insufficient documentation

## 2020-03-07 NOTE — Therapy (Signed)
[facility] [address] [place], [place], [address] Phone: [phone]   Fax:  [phone]  Occupational Therapy Evaluation  Patient Details  Name: Meghan Lawlessatsy R Elgersma MRN: [id number] Date of Birth: 07/16/1934 Referring Provider (OT): Vira BrownsJames Nitka, MD   Encounter Date: 03/06/2020   OT End of Session - 03/06/20 1416    Visit Number 1    Number of Visits 9    Date for OT Re-Evaluation 05/05/20    Authorization Type [employer] Medicare    OT Start Time 1403    OT Stop Time 1445    OT Time Calculation (min) 42 min    Activity Tolerance Patient tolerated treatment well    Behavior During Therapy [facility] for tasks assessed/performed           Past Medical History:  Diagnosis Date  . Arthritis   . Complication of anesthesia   . Diabetes mellitus without complication (HCC)   . Fibromyalgia   . PONV (postoperative nausea and vomiting)     Past Surgical History:  Procedure Laterality Date  . APPENDECTOMY  04   ruptured   . CERVICAL DISCECTOMY  91  . DILATION AND CURETTAGE OF UTERUS  05  . FRACTURE SURGERY Left 06   arm  . JOINT REPLACEMENT Left 08   knee repl  . SHOULDER ARTHROSCOPY Right 2001  . SHOULDER ARTHROSCOPY Left 07  . TONSILLECTOMY    . TOTAL KNEE ARTHROPLASTY Right 03/30/2014   DR NORRIS  . TOTAL KNEE ARTHROPLASTY Right 03/30/2014   Procedure: RIGHT TOTAL KNEE ARTHROPLASTY;  Surgeon: Beverely LowSteve Norris, MD;  [facility];  Service: Orthopedics;  Laterality: Right;    There were no vitals filed for this visit.   Subjective Assessment - 03/06/20 1407    Subjective  Pt reports R hand gets numb and painful, but that wearing a wrist support brace and using diclofenac gel has been helpful. She first went to the physician for this complaint on 12/28/19. Per physician note, pt has been wearing the wrist splint at night and with activity.    Pertinent History OA, spondylosis w/ radiculopathy (cervical region), degenerative disc  disease, DM    Repetition Increases Symptoms    Patient Stated Goals "Avoid having to get surgery"    Currently in Pain? Yes    Pain Score 3     Pain Location Hand    Pain Orientation Right    Pain Descriptors / Indicators Pins and needles    Pain Type Chronic pain    Pain Onset More than a month ago    Pain Frequency Intermittent    Pain Relieving Factors diclofenac gel             [facility] OT Assessment - 03/06/20 1416      Assessment   Medical Diagnosis Carpal Tunnel Syndrome and OA of R hand/fingers    Referring Provider (OT) Vira BrownsJames Nitka, MD    Onset Date/Surgical Date 12/28/19   Approximate (initial physician appt)   Hand Dominance Right    Prior Therapy OP PT for cervicalgia      Balance Screen   Has the patient fallen in the past 6 months No      Home  Environment   Family/patient expects to be discharged to: Private residence    Living Arrangements Spouse/significant other   Willaim ShengBillie (husband)   Type of Home House    Home Layout One level    Additional Comments Pt reports no difficulty with  in-home accessibility      Prior Function   Level of Independence Independent    Vocation Retired    [employer], go to sporting events, spend time with family/grandchildren      ADL   Eating/Feeding Modified independent   Difficulty opening jars/drinks; still eats w/ R hand, but occ uses L when R is too painful/numb   Grooming Modified independent   Uses L hand prn   Upper Body Bathing Independent    Lower Body Bathing Independent    Upper Body Dressing Independent   Pt reports no difficulty with clothing fasteners   Lower Body Dressing Independent      IADL   Light Housekeeping Performs light daily tasks such as dishwashing, bed making   Occasionally requires assist due to pain in R hand/fingers   Meal Prep Plans, prepares and serves adequate meals independently   Eats out mostly   Training and development officer own vehicle    Medication  Management Is responsible for taking medication in correct dosages at correct time   Assist opening bottles     Written Expression   Dominant Hand Right    Handwriting 50% legible      Vision - History   Baseline Vision Wears glasses all the time   Bifocals     Sensation   Light Touch Appears Intact    Hot/Cold Appears Intact      Coordination   Gross Motor Movements are Fluid and Coordinated Yes    Fine Motor Movements are Fluid and Coordinated Yes    9 Hole Peg Test Right;Left    Right 9 Hole Peg Test 29 sec    Left 9 Hole Peg Test 24 sec      Right Hand AROM   R Thumb Radial ABduction/ADduction 0-55 35    R Thumb Palmar ABduction/ADduction 0-45 38      Left Hand AROM   L Thumb Radial ADduction/ABduction 0-55 38    L Thumb Palmar ADduction/ABduction 0-45 40      Hand Function   Right Hand Grip (lbs) 32 lbs    Right Hand Lateral Pinch 12 lbs    Right Hand 3 Point Pinch 7 lbs    Left Hand Grip (lbs) 35 lbs    Left 3 point pinch 12 lbs    Comment 12            OT Education - 03/07/20 2108    Education Details OT provided education on role and purpose of OT, as well as potential interventions and goal areas.    Person(s) Educated Patient    Methods Explanation    Comprehension Verbalized understanding            OT Short Term Goals - 03/06/20 2119      OT SHORT TERM GOAL #1   Title Pt will independently identify and demonstrate understanding of at least 3 joint protection or pain management strategies to implement during ADLs    Baseline No current joint protection or pain management strategies    Time 4    Period Weeks    Status New    Target Date 04/04/20      OT SHORT TERM GOAL #2   Title Pt will verbalize understanding/return demonstration of initial HEP designed for strengthening and ROM of R hand and fingers    Baseline No current HEP    Time 4    Period Weeks    Status New  OT SHORT TERM GOAL #3   Title Pt will be able to retrieve/pick up  medium-weight objects with pain less than 2/10 at least 80% of the time to improve participation in housekeeping tasks    Baseline Decreased grasp strength and increased pain with grasping heavier objects    Time 4    Period Weeks    Status New             OT Long Term Goals - 03/06/20 2134      OT LONG TERM GOAL #1   Title Pt will be independent with home carryover of RUE HEP designed to improve ROM and strength    Baseline No current HEP    Time 8    Period Weeks    Status New    Target Date 05/01/20      OT LONG TERM GOAL #2   Title Pt will be Mod I with opening various jars/containers, using assistive devices prn, 100% of the time    Baseline Requires assist from husband to open jars/medication bottles    Time 8    Period Weeks    Status New      OT LONG TERM GOAL #3   Title Pt will write with 75% legibility using R hand and report no pain at least 50% of the time    Baseline 50% legibility and difficulty with handwriting    Time 8    Period Weeks    Status New      OT LONG TERM GOAL #4   Title Pt will improve participation in functional FM tasks during IADLs as evidenced by decreasing 9-HPT time by 5 seconds with R hand    Baseline Currently has difficulty opening small medication bottles and manipulating small objects due to pain in fingers    Time 8    Period Weeks    Status New      OT LONG TERM GOAL #5   Title Pt will decrease pain in R thumb to improve participation in dressing and housekeeping tasks as evidenced by increasing lateral pinch strength within tolerable level of pain by d/c    Baseline R lateral pinch 7 lbs    Time 8    Period Weeks    Status New             Plan - 03/06/20 2110    Clinical Impression Statement Pt is an 86 y.o. female who present to OP OT secondary to carpal tunnel syndrome of the R hand and OA of the fingers and MCP joints. Symptoms initially became functionally limiting in December of 2021. Pt currently lives at home  with her husband Willaim Sheng) in a single-level home and would benefit from skilled occupational therapy services to address pain management, ROM, strength, functional UE use, energy conservation, and joint protection to improve participation and safety with ADLs and IADLs.    OT Occupational Profile and History Detailed Assessment- Review of Records and additional review of physical, cognitive, psychosocial history related to current functional performance    Occupational performance deficits (Please refer to evaluation for details): ADL's;IADL's;Leisure    Body Structure / Function / Physical Skills ADL;Decreased knowledge of precautions;ROM;UE functional use;Decreased knowledge of use of DME;FMC;Body mechanics;Sensation;Edema;Pain;Strength;Coordination;IADL    Rehab Potential Good    Clinical Decision Making Several treatment options, min-mod task modification necessary    Comorbidities Affecting Occupational Performance: May have comorbidities impacting occupational performance    Modification or Assistance to Complete Evaluation  No modification of  tasks or assist necessary to complete eval    OT Frequency 1x / week    OT Duration 8 weeks    OT Treatment/Interventions Self-care/ADL training;Electrical Stimulation;Therapeutic exercise;Moist Heat;Paraffin;Compression bandaging;Splinting;Patient/family education;Fluidtherapy;Energy conservation;Therapeutic activities;Cryotherapy;Ultrasound;Contrast Bath;DME and/or AE instruction;Manual Therapy;Passive range of motion    Plan Review all goals; introduce joint protection strategies and HEP    Consulted and Agree with Plan of Care Patient           Patient will benefit from skilled therapeutic intervention in order to improve the following deficits and impairments:   Body Structure / Function / Physical Skills: ADL,Decreased knowledge of precautions,ROM,UE functional use,Decreased knowledge of use of DME,FMC,Body  mechanics,Sensation,Edema,Pain,Strength,Coordination,IADL       Visit Diagnosis: Carpal tunnel syndrome, right upper limb  Pain in joint of right hand  Paresthesia of skin  Muscle weakness (generalized)    Problem List Patient Active Problem List   Diagnosis Date Noted  . Chest pain 01/19/2020  . Diabetes mellitus with coincident hypertension (HCC) 01/19/2020  . Other hyperlipidemia 01/19/2020  . Myofascial pain syndrome 02/04/2016  . Spondylosis of lumbar region without myelopathy or radiculopathy 02/04/2016  . DJD (degenerative joint disease), cervical 02/04/2016  . Age-related osteoporosis without current pathological fracture 02/04/2016  . Vitamin D deficiency 02/04/2016  . S/P TKR (total knee replacement) using cement 03/30/2014     Rosie Fate, OTR/L, MSOT 03/07/2020, 9:56 PM  [facility] [address] [place], [place], [address] Phone: [phone]   Fax:  [phone]  Name: HAWLEY PAVIA MRN: [id number] Date of Birth: 10-11-1934

## 2020-03-08 DIAGNOSIS — M189 Osteoarthritis of first carpometacarpal joint, unspecified: Secondary | ICD-10-CM | POA: Diagnosis not present

## 2020-03-08 DIAGNOSIS — J45909 Unspecified asthma, uncomplicated: Secondary | ICD-10-CM | POA: Diagnosis not present

## 2020-03-08 DIAGNOSIS — E119 Type 2 diabetes mellitus without complications: Secondary | ICD-10-CM | POA: Diagnosis not present

## 2020-03-08 DIAGNOSIS — N183 Chronic kidney disease, stage 3 unspecified: Secondary | ICD-10-CM | POA: Diagnosis not present

## 2020-03-08 DIAGNOSIS — E785 Hyperlipidemia, unspecified: Secondary | ICD-10-CM | POA: Diagnosis not present

## 2020-03-08 DIAGNOSIS — M810 Age-related osteoporosis without current pathological fracture: Secondary | ICD-10-CM | POA: Diagnosis not present

## 2020-03-08 DIAGNOSIS — E7800 Pure hypercholesterolemia, unspecified: Secondary | ICD-10-CM | POA: Diagnosis not present

## 2020-03-08 DIAGNOSIS — G8929 Other chronic pain: Secondary | ICD-10-CM | POA: Diagnosis not present

## 2020-03-08 DIAGNOSIS — E118 Type 2 diabetes mellitus with unspecified complications: Secondary | ICD-10-CM | POA: Diagnosis not present

## 2020-03-13 ENCOUNTER — Ambulatory Visit: Payer: Medicare Other | Attending: Specialist | Admitting: Occupational Therapy

## 2020-03-13 DIAGNOSIS — M25541 Pain in joints of right hand: Secondary | ICD-10-CM | POA: Insufficient documentation

## 2020-03-13 DIAGNOSIS — R202 Paresthesia of skin: Secondary | ICD-10-CM | POA: Insufficient documentation

## 2020-03-13 DIAGNOSIS — M6281 Muscle weakness (generalized): Secondary | ICD-10-CM | POA: Insufficient documentation

## 2020-03-13 DIAGNOSIS — G5601 Carpal tunnel syndrome, right upper limb: Secondary | ICD-10-CM | POA: Insufficient documentation

## 2020-03-14 ENCOUNTER — Encounter: Payer: Self-pay | Admitting: Occupational Therapy

## 2020-03-14 ENCOUNTER — Other Ambulatory Visit: Payer: Self-pay

## 2020-03-14 ENCOUNTER — Ambulatory Visit: Payer: Medicare Other | Admitting: Occupational Therapy

## 2020-03-14 DIAGNOSIS — G5601 Carpal tunnel syndrome, right upper limb: Secondary | ICD-10-CM | POA: Diagnosis not present

## 2020-03-14 DIAGNOSIS — M6281 Muscle weakness (generalized): Secondary | ICD-10-CM | POA: Diagnosis not present

## 2020-03-14 DIAGNOSIS — R202 Paresthesia of skin: Secondary | ICD-10-CM

## 2020-03-14 DIAGNOSIS — M25541 Pain in joints of right hand: Secondary | ICD-10-CM | POA: Diagnosis not present

## 2020-03-14 NOTE — Therapy (Addendum)
[place] [place], [place], [address] Phone: [phone]   Fax:  [phone]  Occupational Therapy Treatment  Patient Details  Name: Meghan Alvarez MRN: [id number] Date of Birth: 12/27/1934 Referring Provider (OT): Basil Dess, MD   Encounter Date: 03/14/2020   OT End of Session - 03/14/20 1802    Visit Number 2    Number of Visits 9    Date for OT Re-Evaluation 05/05/20    Authorization Type [employer] Medicare    OT Start Time 1535   Pt arrived 5 min late   OT Stop Time 1615    OT Time Calculation (min) 40 min    Activity Tolerance Patient tolerated treatment well    Behavior During Therapy [facility] for tasks assessed/performed           OCCUPATIONAL THERAPY DISCHARGE SUMMARY  Visits from Start of Care: 2  Current functional level related to goals / functional outcomes: Difficulty with lifting medium-weight objects, opening jars, and participation w/ functional FM tasks; impacted handwriting; decreased knowledge of joint protection and compensatory strategies.   Remaining deficits: Pain; decreased ROM, strength, and functional use of R hand   Education / Equipment: Gentle nerve gliding and stretching   Plan:                                                    Patient goals were not met. Patient is being discharged due to not returning since the last visit.  ?????      Past Medical History:  Diagnosis Date  . Arthritis   . Complication of anesthesia   . Diabetes mellitus without complication ([place])   . Fibromyalgia   . PONV (postoperative nausea and vomiting)     Past Surgical History:  Procedure Laterality Date  . APPENDECTOMY  04   ruptured   . CERVICAL DISCECTOMY  91  . DILATION AND CURETTAGE OF UTERUS  05  . FRACTURE SURGERY Left 06   arm  . JOINT REPLACEMENT Left 08   knee repl  . SHOULDER ARTHROSCOPY Right 2001  . SHOULDER ARTHROSCOPY Left 07  . TONSILLECTOMY    . TOTAL KNEE ARTHROPLASTY  Right 03/30/2014   DR NORRIS  . TOTAL KNEE ARTHROPLASTY Right 03/30/2014   Procedure: RIGHT TOTAL KNEE ARTHROPLASTY;  Surgeon: Netta Cedars, MD;  Location: [facility];  Service: Orthopedics;  Laterality: Right;    There were no vitals filed for this visit.   Subjective Assessment - 03/14/20 1538    Subjective  Pt reports not much has changed since her previous visit and that she is concerned she may ultimately need to have surgery.    Pertinent History OA, spondylosis w/ radiculopathy (cervical region), degenerative disc disease, DM    Repetition Increases Symptoms    Patient Stated Goals "Avoid having to get surgery"    Currently in Pain? Yes    Pain Score 6     Pain Location Hand    Pain Orientation Right    Pain Descriptors / Indicators Numbness;Pins and needles;Tingling    Pain Type Chronic pain    Pain Onset More than a month ago    Pain Frequency Intermittent            OT Treatments/Exercises (OP) - 03/14/20 1809      ADLs  ADL Education Given Yes    Foam Grip for Utensils/Toothbrush/Pen OT introduced/discussed some joint protection strategies and administed foam grip to help alleviate symptoms when handing utensils during mealtimes      Hand Exercises   Opposition Right;AROM;15 reps   Gentle AROM of thumb opposition to base of 5th digit; pt reported no pain   Other Hand Exercises Median nerve gliding exercises practiced w/ pt; completed 10x   Education provided, pt returned demonstration, and handout administered for exercises to be implemented in HEP     Manual Therapy   Soft tissue mobilization Gentle soft tissue massage and stretching of the R hand, fingers, and thumb used to facilitate decreased edema and pain            OT Education - 03/14/20 1807    Education Details OT reviewed all goals w/ pt and introduced HEP    Person(s) Educated Patient    Methods Explanation;Handout    Comprehension Verbalized understanding            OT Short Term Goals -  03/14/20 1806      OT SHORT TERM GOAL #1   Title Pt will independently identify and demonstrate understanding of at least 3 joint protection or pain management strategies to implement during ADLs    Baseline No current joint protection or pain management strategies    Time 4    Period Weeks    Status On-going    Target Date 04/04/20      OT SHORT TERM GOAL #2   Title Pt will verbalize understanding/return demonstration of initial HEP designed for strengthening and ROM of R hand and fingers    Baseline No current HEP    Time 4    Period Weeks    Status On-going      OT SHORT TERM GOAL #3   Title Pt will be able to retrieve/pick up medium-weight objects with pain less than 2/10 at least 80% of the time to improve participation in housekeeping tasks    Baseline Decreased grasp strength and increased pain with grasping heavier objects    Time 4    Period Weeks    Status On-going            OT Long Term Goals - 03/14/20 1806      OT LONG TERM GOAL #1   Title Pt will be independent with home carryover of RUE HEP designed to improve ROM and strength    Baseline No current HEP    Time 8    Period Weeks    Status On-going      OT LONG TERM GOAL #2   Title Pt will be Mod I with opening various jars/containers, using assistive devices prn, 100% of the time    Baseline Requires assist from husband to open jars/medication bottles    Time 8    Period Weeks    Status On-going      OT LONG TERM GOAL #3   Title Pt will write with 75% legibility using R hand and report no pain at least 50% of the time    Baseline 50% legibility and difficulty with handwriting    Time 8    Period Weeks    Status On-going      OT LONG TERM GOAL #4   Title Pt will improve participation in functional FM tasks during IADLs as evidenced by decreasing 9-HPT time by 5 seconds with R hand    Baseline Currently has difficulty opening  small medication bottles and manipulating small objects due to pain in  fingers    Time 8    Period Weeks    Status On-going      OT LONG TERM GOAL #5   Title Pt will decrease pain in R thumb to improve participation in dressing and housekeeping tasks as evidenced by increasing lateral pinch strength within tolerable level of pain by d/c    Baseline R lateral pinch 7 lbs    Time 8    Period Weeks    Status On-going            Plan - 03/14/20 1803    Clinical Impression Statement Pt's primary concern is related to pain management; her symptoms do affect her functionally, but she is able to request assist from her husband opposed to implementing pain management/adaptive strategies. Gentle nerve gliding exercises and stretching was implementing today to alleviate symptoms.    OT Occupational Profile and History Detailed Assessment- Review of Records and additional review of physical, cognitive, psychosocial history related to current functional performance    Occupational performance deficits (Please refer to evaluation for details): ADL's;IADL's;Leisure    Body Structure / Function / Physical Skills ADL;Decreased knowledge of precautions;ROM;UE functional use;Decreased knowledge of use of DME;FMC;Body mechanics;Sensation;Edema;Pain;Strength;Coordination;IADL    Rehab Potential Good    Clinical Decision Making Several treatment options, min-mod task modification necessary    Comorbidities Affecting Occupational Performance: May have comorbidities impacting occupational performance    Modification or Assistance to Complete Evaluation  No modification of tasks or assist necessary to complete eval    OT Frequency 1x / week    OT Duration 8 weeks    OT Treatment/Interventions Self-care/ADL training;Electrical Stimulation;Therapeutic exercise;Moist Heat;Paraffin;Compression bandaging;Splinting;Patient/family education;Fluidtherapy;Energy conservation;Therapeutic activities;Cryotherapy;Ultrasound;Contrast Bath;DME and/or AE instruction;Manual Therapy;Passive range of  motion    Plan Introduce pain management techniques    Consulted and Agree with Plan of Care Patient           Patient will benefit from skilled therapeutic intervention in order to improve the following deficits and impairments:   Body Structure / Function / Physical Skills: ADL,Decreased knowledge of precautions,ROM,UE functional use,Decreased knowledge of use of DME,FMC,Body mechanics,Sensation,Edema,Pain,Strength,Coordination,IADL       Visit Diagnosis: Carpal tunnel syndrome, right upper limb  Pain in joint of right hand  Paresthesia of skin  Muscle weakness (generalized)    Problem List Patient Active Problem List   Diagnosis Date Noted  . Chest pain 01/19/2020  . Diabetes mellitus with coincident hypertension ([place]) 01/19/2020  . Other hyperlipidemia 01/19/2020  . Myofascial pain syndrome 02/04/2016  . Spondylosis of lumbar region without myelopathy or radiculopathy 02/04/2016  . DJD (degenerative joint disease), cervical 02/04/2016  . Age-related osteoporosis without current pathological fracture 02/04/2016  . Vitamin D deficiency 02/04/2016  . S/P TKR (total knee replacement) using cement 03/30/2014     Kathrine Cords, OTR/L, MSOT 03/14/2020, 6:14 PM  [address] [place], [place], [address] Phone: [phone]   Fax:  [phone]  Name: Meghan Alvarez MRN: [id number] Date of Birth: Mar 19, 1934

## 2020-03-20 ENCOUNTER — Ambulatory Visit: Payer: Medicare Other | Admitting: Occupational Therapy

## 2020-03-27 ENCOUNTER — Ambulatory Visit: Payer: Medicare Other | Admitting: Occupational Therapy

## 2020-03-29 ENCOUNTER — Other Ambulatory Visit: Payer: Self-pay | Admitting: Specialist

## 2020-04-01 ENCOUNTER — Telehealth: Payer: Self-pay | Admitting: Rheumatology

## 2020-04-01 DIAGNOSIS — E118 Type 2 diabetes mellitus with unspecified complications: Secondary | ICD-10-CM | POA: Diagnosis not present

## 2020-04-01 DIAGNOSIS — N183 Chronic kidney disease, stage 3 unspecified: Secondary | ICD-10-CM | POA: Diagnosis not present

## 2020-04-01 DIAGNOSIS — G8929 Other chronic pain: Secondary | ICD-10-CM | POA: Diagnosis not present

## 2020-04-01 DIAGNOSIS — E785 Hyperlipidemia, unspecified: Secondary | ICD-10-CM | POA: Diagnosis not present

## 2020-04-01 DIAGNOSIS — E119 Type 2 diabetes mellitus without complications: Secondary | ICD-10-CM | POA: Diagnosis not present

## 2020-04-01 DIAGNOSIS — J45909 Unspecified asthma, uncomplicated: Secondary | ICD-10-CM | POA: Diagnosis not present

## 2020-04-01 DIAGNOSIS — N189 Chronic kidney disease, unspecified: Secondary | ICD-10-CM | POA: Diagnosis not present

## 2020-04-01 DIAGNOSIS — M810 Age-related osteoporosis without current pathological fracture: Secondary | ICD-10-CM | POA: Diagnosis not present

## 2020-04-01 DIAGNOSIS — M189 Osteoarthritis of first carpometacarpal joint, unspecified: Secondary | ICD-10-CM | POA: Diagnosis not present

## 2020-04-01 DIAGNOSIS — E7800 Pure hypercholesterolemia, unspecified: Secondary | ICD-10-CM | POA: Diagnosis not present

## 2020-04-01 NOTE — Telephone Encounter (Signed)
Patient calling in reference to the three medications Dr. Corliss Skains took her off the last time she was seen here. Patient could remember one was Tramadol, but not the other two. Please call to advise.

## 2020-04-01 NOTE — Telephone Encounter (Addendum)
I called patient, patient verbalized understanding, office note from 02/07/2020, She takes Robaxin 500 mg, 1 tablet on daily basis in the afternoon.  I have advised her to use it on as needed basis.  Side effect of muscle relaxers were discussed.  She takes Flexeril only occasionally.  I have advised her to discontinue Flexeril.  Interaction of Flexeril with tramadol and Cymbalta was also emphasized.

## 2020-04-03 ENCOUNTER — Ambulatory Visit: Payer: Medicare Other | Admitting: Occupational Therapy

## 2020-04-04 DIAGNOSIS — N3001 Acute cystitis with hematuria: Secondary | ICD-10-CM | POA: Diagnosis not present

## 2020-04-05 ENCOUNTER — Other Ambulatory Visit: Payer: Self-pay | Admitting: Specialist

## 2020-04-05 NOTE — Telephone Encounter (Signed)
Please advise 

## 2020-04-08 ENCOUNTER — Telehealth: Payer: Self-pay | Admitting: Specialist

## 2020-04-08 ENCOUNTER — Other Ambulatory Visit: Payer: Self-pay | Admitting: Specialist

## 2020-04-08 MED ORDER — GABAPENTIN 100 MG PO CAPS: ORAL_CAPSULE | ORAL | 4 refills | Status: DC

## 2020-04-08 NOTE — Telephone Encounter (Signed)
Please advise as this rx was sent in on 04/05/20

## 2020-04-08 NOTE — Telephone Encounter (Signed)
Ally with [facility] called stating they sent in a request to have the pt gabapentin rx changed from a 30 day supply w/ 1 refill to a 90 day supply, since that's how all her other rx are written. Connye Burkitt would like a CB to let her know if this will be possible.  Connye Burkitt CB# [phone]

## 2020-04-08 NOTE — Telephone Encounter (Signed)
I sent a 90 day Rx to Mail order pharmacy. [facility]

## 2020-04-10 ENCOUNTER — Ambulatory Visit: Payer: Medicare Other | Admitting: Occupational Therapy

## 2020-04-18 ENCOUNTER — Ambulatory Visit: Payer: Self-pay

## 2020-04-18 ENCOUNTER — Other Ambulatory Visit: Payer: Self-pay

## 2020-04-18 ENCOUNTER — Ambulatory Visit: Payer: Medicare Other | Admitting: Specialist

## 2020-04-18 ENCOUNTER — Encounter: Payer: Self-pay | Admitting: Specialist

## 2020-04-18 VITALS — BP 156/80 | HR 79 | Ht 65.0 in | Wt 184.0 lb

## 2020-04-18 DIAGNOSIS — R200 Anesthesia of skin: Secondary | ICD-10-CM

## 2020-04-18 DIAGNOSIS — G5601 Carpal tunnel syndrome, right upper limb: Secondary | ICD-10-CM

## 2020-04-18 DIAGNOSIS — M19041 Primary osteoarthritis, right hand: Secondary | ICD-10-CM

## 2020-04-18 DIAGNOSIS — M79641 Pain in right hand: Secondary | ICD-10-CM | POA: Diagnosis not present

## 2020-04-18 NOTE — Progress Notes (Signed)
Office Visit Note   Patient: Meghan Alvarez           Date of Birth: 04-Sep-1934           MRN: [id number] Visit Date: 04/18/2020              Requested by: Catha Gosselin, MD [address] [place],  [place] [address] PCP: Catha Gosselin, MD   Assessment & Plan: Visit Diagnoses:  1. Pain in right hand   2. Carpal tunnel syndrome, right upper limb   3. Primary osteoarthritis, right hand   4. Numbness of right hand     Plan: May use the wrist splints as needed. Our office will contact you to schedule for a left open carpal tunnel release. We will also plan to perforn an injection of the thumb joint. Tivis Ringer is the surgery scheduler and she will get approval from your insurance and call you. Risk of surgery includes risk infection 1:300, bleeding is not usual, 1 in 1,000,000 risk of blood loss that is significant. Risk to the nerve is 1:30,000.  Follow-Up Instructions: Return in about 4 weeks (around 05/16/2020).   Orders:  Orders Placed This Encounter  Procedures  . XR Hand Complete Right   No orders of the defined types were placed in this encounter.     Procedures: No procedures performed   Clinical Data: No additional findings.   Subjective: Chief Complaint  Patient presents with  . Right Hand - Follow-up    Wants to discuss Carpal Tunnel release on her right hand.    86 year old female with persistent pain into the right hand. Using the voltaren gel and it helps on the whole right hand. She has had pain into the right thumb MCP and C-MC joint. She has had numbness into the right hand radial fingers as well the finger tips.  Using the right wrist splint. She is having night pain right hand and pain all the time.    Review of Systems  Constitutional: Negative.   HENT: Negative.   Eyes: Negative.   Respiratory: Negative.   Cardiovascular: Negative.   Gastrointestinal: Negative.   Endocrine: Negative.   Genitourinary: Negative.   Musculoskeletal:  Negative.   Skin: Negative.   Allergic/Immunologic: Negative.   Neurological: Negative.   Hematological: Negative.   Psychiatric/Behavioral: Negative.      Objective: Vital Signs: BP (!) 156/80 (BP Location: Left Arm, Patient Position: Sitting)   Pulse 79   Ht 5\' 5"  (1.651 m)   Wt 184 lb (83.5 kg)   BMI 30.62 kg/m   Physical Exam  Ortho Exam  Specialty Comments:  No specialty comments available.  Imaging: No results found.   PMFS History: Patient Active Problem List   Diagnosis Date Noted  . Chest pain 01/19/2020  . Diabetes mellitus with coincident hypertension ([facility]) 01/19/2020  . Other hyperlipidemia 01/19/2020  . Myofascial pain syndrome 02/04/2016  . Spondylosis of lumbar region without myelopathy or radiculopathy 02/04/2016  . DJD (degenerative joint disease), cervical 02/04/2016  . Age-related osteoporosis without current pathological fracture 02/04/2016  . Vitamin D deficiency 02/04/2016  . S/P TKR (total knee replacement) using cement 03/30/2014   Past Medical History:  Diagnosis Date  . Arthritis   . Complication of anesthesia   . Diabetes mellitus without complication ([facility])   . Fibromyalgia   . PONV (postoperative nausea and vomiting)     Family History  Problem Relation Age of Onset  . Cancer Sister  breast  . Diabetes Brother     Past Surgical History:  Procedure Laterality Date  . APPENDECTOMY  04   ruptured   . CERVICAL DISCECTOMY  91  . DILATION AND CURETTAGE OF UTERUS  05  . FRACTURE SURGERY Left 06   arm  . JOINT REPLACEMENT Left 08   knee repl  . SHOULDER ARTHROSCOPY Right 2001  . SHOULDER ARTHROSCOPY Left 07  . TONSILLECTOMY    . TOTAL KNEE ARTHROPLASTY Right 03/30/2014   DR NORRIS  . TOTAL KNEE ARTHROPLASTY Right 03/30/2014   Procedure: RIGHT TOTAL KNEE ARTHROPLASTY;  Surgeon: Beverely Low, MD;  Location: [facility] OR;  Service: Orthopedics;  Laterality: Right;   Social History   Occupational History  . Not on file   Tobacco Use  . Smoking status: Never Smoker  . Smokeless tobacco: Never Used  Vaping Use  . Vaping Use: Never used  Substance and Sexual Activity  . Alcohol use: Yes    Comment: RARELY  . Drug use: Never  . Sexual activity: Not on file

## 2020-04-18 NOTE — Patient Instructions (Signed)
May use the wrist splints as needed. Our office will contact you to schedule for a left open carpal tunnel release. We will also plan to perforn an injection of the thumb joint. Tivis Ringer is the surgery scheduler and she will get approval from your insurance and call you. Risk of surgery includes risk infection 1:300, bleeding is not usual, 1 in 1,000,000 risk of blood loss that is significant. Risk to the nerve is 1:30,000.

## 2020-04-22 ENCOUNTER — Other Ambulatory Visit: Payer: Self-pay

## 2020-04-23 ENCOUNTER — Other Ambulatory Visit: Payer: Self-pay

## 2020-04-23 ENCOUNTER — Encounter (HOSPITAL_BASED_OUTPATIENT_CLINIC_OR_DEPARTMENT_OTHER): Payer: Self-pay | Admitting: Specialist

## 2020-04-29 ENCOUNTER — Telehealth: Payer: Self-pay | Admitting: Specialist

## 2020-04-29 NOTE — Telephone Encounter (Signed)
Patient called requesting a call back from [place]. Patient states she has a couple more questions about her up coming surgery and medications not to take. Please call patient at [phone].

## 2020-04-30 ENCOUNTER — Other Ambulatory Visit (HOSPITAL_COMMUNITY): Payer: Medicare Other

## 2020-04-30 NOTE — Telephone Encounter (Signed)
I called and lmom that I was calling her back, and that I would try again later this afternoon.

## 2020-05-01 ENCOUNTER — Other Ambulatory Visit (HOSPITAL_COMMUNITY)
Admission: RE | Admit: 2020-05-01 | Discharge: 2020-05-01 | Disposition: A | Discharge status: Home / Self Care | Payer: Medicare Other | Source: Ambulatory Visit | Attending: Specialist | Admitting: Specialist

## 2020-05-01 DIAGNOSIS — Z01812 Encounter for preprocedural laboratory examination: Secondary | ICD-10-CM | POA: Insufficient documentation

## 2020-05-01 DIAGNOSIS — Z20822 Contact with and (suspected) exposure to covid-19: Secondary | ICD-10-CM | POA: Diagnosis not present

## 2020-05-01 LAB — SARS CORONAVIRUS 2 (TAT 6-24 HRS): SARS Coronavirus 2: NEGATIVE

## 2020-05-02 ENCOUNTER — Encounter (HOSPITAL_BASED_OUTPATIENT_CLINIC_OR_DEPARTMENT_OTHER)
Admission: RE | Admit: 2020-05-02 | Discharge: 2020-05-02 | Disposition: A | Discharge status: Home / Self Care | Payer: Medicare Other | Source: Ambulatory Visit | Attending: Specialist | Admitting: Specialist

## 2020-05-02 DIAGNOSIS — Z01812 Encounter for preprocedural laboratory examination: Secondary | ICD-10-CM | POA: Insufficient documentation

## 2020-05-02 DIAGNOSIS — M19041 Primary osteoarthritis, right hand: Secondary | ICD-10-CM | POA: Diagnosis not present

## 2020-05-02 DIAGNOSIS — Z79899 Other long term (current) drug therapy: Secondary | ICD-10-CM | POA: Diagnosis not present

## 2020-05-02 DIAGNOSIS — G5601 Carpal tunnel syndrome, right upper limb: Secondary | ICD-10-CM | POA: Diagnosis not present

## 2020-05-02 DIAGNOSIS — Z96653 Presence of artificial knee joint, bilateral: Secondary | ICD-10-CM | POA: Diagnosis not present

## 2020-05-02 LAB — CBC
HCT: 38.7 % (ref 36.0–46.0)
Hemoglobin: 13 g/dL (ref 12.0–15.0)
MCH: 31.8 pg (ref 26.0–34.0)
MCHC: 33.6 g/dL (ref 30.0–36.0)
MCV: 94.6 fL (ref 80.0–100.0)
Platelets: 199 10*3/uL (ref 150–400)
RBC: 4.09 MIL/uL (ref 3.87–5.11)
RDW: 13 % (ref 11.5–15.5)
WBC: 5 10*3/uL (ref 4.0–10.5)
nRBC: 0 % (ref 0.0–0.2)

## 2020-05-02 LAB — BASIC METABOLIC PANEL
Anion gap: 6 (ref 5–15)
BUN: 9 mg/dL (ref 8–23)
CO2: 26 mmol/L (ref 22–32)
Calcium: 9.8 mg/dL (ref 8.9–10.3)
Chloride: 106 mmol/L (ref 98–111)
Creatinine, Ser: 0.9 mg/dL (ref 0.44–1.00)
GFR, Estimated: >60 mL/min (ref >60)
Glucose, Bld: 93 mg/dL (ref 70–99)
Potassium: 4.5 mmol/L (ref 3.5–5.1)
Sodium: 138 mmol/L (ref 135–145)

## 2020-05-02 LAB — SURGICAL PCR SCREEN
MRSA, PCR: NEGATIVE
Staphylococcus aureus: NEGATIVE

## 2020-05-02 NOTE — Progress Notes (Signed)

## 2020-05-02 NOTE — Progress Notes (Signed)
Sent message reminding pt to come in for lab work today before 3pm.

## 2020-05-02 NOTE — Telephone Encounter (Signed)
Patient states that she was told to stop the cymbalta for 5 days prior and she did on Sunday and she is not to eat/drink after midnight

## 2020-05-03 ENCOUNTER — Ambulatory Visit (HOSPITAL_BASED_OUTPATIENT_CLINIC_OR_DEPARTMENT_OTHER): Payer: Medicare Other | Admitting: Anesthesiology

## 2020-05-03 ENCOUNTER — Encounter (HOSPITAL_BASED_OUTPATIENT_CLINIC_OR_DEPARTMENT_OTHER)
Admission: RE | Disposition: A | Discharge status: Home / Self Care | Payer: Self-pay | Source: Home / Self Care | Attending: Specialist

## 2020-05-03 ENCOUNTER — Ambulatory Visit (HOSPITAL_BASED_OUTPATIENT_CLINIC_OR_DEPARTMENT_OTHER)
Admission: RE | Admit: 2020-05-03 | Discharge: 2020-05-03 | Disposition: A | Discharge status: Home / Self Care | Payer: Medicare Other | Attending: Specialist | Admitting: Specialist

## 2020-05-03 ENCOUNTER — Ambulatory Visit: Payer: Medicare Other | Admitting: Specialist

## 2020-05-03 ENCOUNTER — Other Ambulatory Visit: Payer: Self-pay

## 2020-05-03 ENCOUNTER — Encounter (HOSPITAL_BASED_OUTPATIENT_CLINIC_OR_DEPARTMENT_OTHER): Payer: Self-pay | Admitting: Specialist

## 2020-05-03 DIAGNOSIS — G5601 Carpal tunnel syndrome, right upper limb: Secondary | ICD-10-CM | POA: Diagnosis not present

## 2020-05-03 DIAGNOSIS — Z79899 Other long term (current) drug therapy: Secondary | ICD-10-CM | POA: Diagnosis not present

## 2020-05-03 DIAGNOSIS — Z96653 Presence of artificial knee joint, bilateral: Secondary | ICD-10-CM | POA: Insufficient documentation

## 2020-05-03 DIAGNOSIS — M19041 Primary osteoarthritis, right hand: Secondary | ICD-10-CM | POA: Diagnosis not present

## 2020-05-03 HISTORY — PX: CARPAL TUNNEL RELEASE: SHX101

## 2020-05-03 HISTORY — DX: Hyperlipidemia, unspecified: E78.5

## 2020-05-03 SURGERY — CARPAL TUNNEL RELEASE
Anesthesia: Regional | Site: Wrist | Laterality: Right

## 2020-05-03 MED ORDER — ONDANSETRON HCL 4 MG/2ML IJ SOLN
INTRAMUSCULAR | Status: DC | PRN
  Administered 2020-05-03: 4 mg via INTRAVENOUS

## 2020-05-03 MED ORDER — METHYLPREDNISOLONE ACETATE 40 MG/ML IJ SUSP
INTRAMUSCULAR | Status: AC
  Filled 2020-05-03: qty 1

## 2020-05-03 MED ORDER — PROPOFOL 500 MG/50ML IV EMUL
INTRAVENOUS | Status: DC | PRN
  Administered 2020-05-03: 50 ug/kg/min via INTRAVENOUS

## 2020-05-03 MED ORDER — LIDOCAINE HCL (PF) 0.5 % IJ SOLN
INTRAMUSCULAR | Status: DC | PRN
  Administered 2020-05-03: 30 mL via INTRAVENOUS

## 2020-05-03 MED ORDER — FENTANYL CITRATE (PF) 100 MCG/2ML IJ SOLN
INTRAMUSCULAR | Status: DC | PRN
  Administered 2020-05-03: 50 ug via INTRAVENOUS

## 2020-05-03 MED ORDER — LACTATED RINGERS IV SOLN: INTRAVENOUS | Status: DC

## 2020-05-03 MED ORDER — BUPIVACAINE LIPOSOME 1.3 % IJ SUSP
INTRAMUSCULAR | Status: AC
  Filled 2020-05-03: qty 20

## 2020-05-03 MED ORDER — FENTANYL CITRATE (PF) 100 MCG/2ML IJ SOLN
INTRAMUSCULAR | Status: AC
  Filled 2020-05-03: qty 2

## 2020-05-03 MED ORDER — BUPIVACAINE HCL (PF) 0.25 % IJ SOLN
INTRAMUSCULAR | Status: AC
  Filled 2020-05-03: qty 30

## 2020-05-03 MED ORDER — CEFAZOLIN SODIUM-DEXTROSE 2-4 GM/100ML-% IV SOLN
INTRAVENOUS | Status: AC
  Filled 2020-05-03: qty 100

## 2020-05-03 MED ORDER — METHYLPREDNISOLONE ACETATE 40 MG/ML IJ SUSP
INTRAMUSCULAR | Status: DC | PRN
  Administered 2020-05-03: 20 mg via INTRA_ARTICULAR

## 2020-05-03 MED ORDER — HYDROCODONE-ACETAMINOPHEN 5-300 MG PO TABS: 1.0000 | ORAL_TABLET | ORAL | 0 refills | Status: AC | PRN

## 2020-05-03 MED ORDER — CEFAZOLIN SODIUM-DEXTROSE 2-4 GM/100ML-% IV SOLN
2.0000 g | INTRAVENOUS | Status: AC
  Administered 2020-05-03: 2 g via INTRAVENOUS

## 2020-05-03 MED ORDER — BUPIVACAINE HCL (PF) 0.25 % IJ SOLN
INTRAMUSCULAR | Status: DC | PRN
  Administered 2020-05-03: 10 mL

## 2020-05-03 SURGICAL SUPPLY — 50 items
ADH SKN CLS APL DERMABOND .7 (GAUZE/BANDAGES/DRESSINGS) ×1
APL SKNCLS STERI-STRIP NONHPOA (GAUZE/BANDAGES/DRESSINGS)
BAND INSRT 18 STRL LF DISP RB (MISCELLANEOUS)
BAND RUBBER #18 3X1/16 STRL (MISCELLANEOUS) IMPLANT
BENZOIN TINCTURE PRP APPL 2/3 (GAUZE/BANDAGES/DRESSINGS) IMPLANT
BLADE SURG 15 STRL LF DISP TIS (BLADE) ×1 IMPLANT
BLADE SURG 15 STRL SS (BLADE) ×2
BNDG CMPR 9X4 STRL LF SNTH (GAUZE/BANDAGES/DRESSINGS) ×1
BNDG ELASTIC 3X5.8 VLCR STR LF (GAUZE/BANDAGES/DRESSINGS) ×2 IMPLANT
BNDG ESMARK 4X9 LF (GAUZE/BANDAGES/DRESSINGS) ×1 IMPLANT
CORD BIPOLAR FORCEPS 12FT (ELECTRODE) ×2 IMPLANT
COVER BACK TABLE 60X90IN (DRAPES) ×2 IMPLANT
COVER MAYO STAND STRL (DRAPES) ×2 IMPLANT
COVER WAND RF STERILE (DRAPES) IMPLANT
CUFF TOURN SGL QUICK 18X4 (TOURNIQUET CUFF) ×2 IMPLANT
DERMABOND ADVANCED (GAUZE/BANDAGES/DRESSINGS) ×1
DERMABOND ADVANCED .7 DNX12 (GAUZE/BANDAGES/DRESSINGS) ×1 IMPLANT
DRAPE EXTREMITY T 121X128X90 (DISPOSABLE) ×2 IMPLANT
DRAPE SURG 17X23 STRL (DRAPES) ×2 IMPLANT
DRSG EMULSION OIL 3X3 NADH (GAUZE/BANDAGES/DRESSINGS) ×2 IMPLANT
DRSG PAD ABDOMINAL 8X10 ST (GAUZE/BANDAGES/DRESSINGS) IMPLANT
DURAPREP 26ML APPLICATOR (WOUND CARE) ×3 IMPLANT
ELECT REM PT RETURN 9FT ADLT (ELECTROSURGICAL)
ELECTRODE REM PT RTRN 9FT ADLT (ELECTROSURGICAL) IMPLANT
GAUZE SPONGE 4X4 12PLY STRL (GAUZE/BANDAGES/DRESSINGS) ×2 IMPLANT
GAUZE SPONGE 4X4 12PLY STRL LF (GAUZE/BANDAGES/DRESSINGS) ×1 IMPLANT
GLOVE ORTHO TXT STRL SZ7.5 (GLOVE) ×2 IMPLANT
GLOVE SRG 8 PF TXTR STRL LF DI (GLOVE) ×1 IMPLANT
GLOVE SURG UNDER POLY LF SZ8 (GLOVE) ×2
GLOVE SURG UNDER POLY LF SZ9 (GLOVE) ×2 IMPLANT
GOWN STRL REUS W/ TWL LRG LVL3 (GOWN DISPOSABLE) ×1 IMPLANT
GOWN STRL REUS W/TWL 2XL LVL3 (GOWN DISPOSABLE) ×2 IMPLANT
GOWN STRL REUS W/TWL LRG LVL3 (GOWN DISPOSABLE) ×2
NEEDLE HYPO 22GX1.5 SAFETY (NEEDLE) ×1 IMPLANT
PACK BASIN DAY SURGERY FS (CUSTOM PROCEDURE TRAY) ×2 IMPLANT
PAD CAST 3X4 CTTN HI CHSV (CAST SUPPLIES) ×1 IMPLANT
PADDING CAST ABS 4INX4YD NS (CAST SUPPLIES) ×1
PADDING CAST ABS COTTON 4X4 ST (CAST SUPPLIES) ×1 IMPLANT
PADDING CAST COTTON 3X4 STRL (CAST SUPPLIES) ×2
PENCIL SMOKE EVACUATOR (MISCELLANEOUS) IMPLANT
SPLINT FIBERGLASS 3X35 (CAST SUPPLIES) IMPLANT
SPLINT PLASTER CAST XFAST 3X15 (CAST SUPPLIES) IMPLANT
SPLINT PLASTER XTRA FASTSET 3X (CAST SUPPLIES)
STOCKINETTE 4X48 STRL (DRAPES) ×2 IMPLANT
STRIP CLOSURE SKIN 1/2X4 (GAUZE/BANDAGES/DRESSINGS) IMPLANT
SUT ETHILON 4 0 PS 2 18 (SUTURE) ×2 IMPLANT
SUT VIC AB 3-0 FS2 27 (SUTURE) IMPLANT
SYR BULB EAR ULCER 3OZ GRN STR (SYRINGE) IMPLANT
SYR CONTROL 10ML LL (SYRINGE) ×1 IMPLANT
TOWEL GREEN STERILE FF (TOWEL DISPOSABLE) ×2 IMPLANT

## 2020-05-03 NOTE — Transfer of Care (Signed)
Immediate Anesthesia Transfer of Care Note  Patient: Meghan Alvarez  Procedure(s) Performed: RIGHT OPEN CARPAL TUNNEL RELEASE, RIGHT THUMB METACARPOPHALANGEAL JOINT INJECTION (Right Wrist)  Patient Location: PACU  Anesthesia Type:Bier block and MAC combined with regional for post-op pain  Level of Consciousness: awake, alert , oriented, drowsy and patient cooperative  Airway & Oxygen Therapy: Patient Spontanous Breathing and Patient connected to face mask oxygen  Post-op Assessment: Report given to RN and Post -op Vital signs reviewed and stable  Post vital signs: Reviewed and stable  Last Vitals:  Vitals Value Taken Time  BP    Temp    Pulse 74 05/03/20 1409  Resp 18 05/03/20 1409  SpO2 99 % 05/03/20 1409  Vitals shown include unvalidated device data.  Last Pain:  Vitals:   05/03/20 1134  TempSrc: Oral  PainSc: 5          Complications: No complications documented.

## 2020-05-03 NOTE — H&P (Signed)
PREOPERATIVE H&P  Chief Complaint: RIGHT CARPAL TUNNEL SYNDROME, RIGHT THUMB METACARPOPHALANGEAL ARTHRITIS  HPI: Meghan Alvarez is a 86 y.o. female who presents for preoperative history and physical with a diagnosis of RIGHT CARPAL TUNNEL SYNDROME, RIGHT THUMB METACARPOPHALANGEAL ARTHRITIS. Symptoms are rated as moderate to severe, and have been worsening.  This is significantly impairing activities of daily living.  She has elected for surgical management.   Past Medical History:  Diagnosis Date  . Arthritis   . Complication of anesthesia   . Diabetes mellitus without complication ([facility])   . Fibromyalgia   . Hyperlipemia   . PONV (postoperative nausea and vomiting)    Past Surgical History:  Procedure Laterality Date  . APPENDECTOMY  04   ruptured   . CERVICAL DISCECTOMY  91  . DILATION AND CURETTAGE OF UTERUS  05  . FRACTURE SURGERY Left 06   arm  . JOINT REPLACEMENT Left 08   knee repl  . SHOULDER ARTHROSCOPY Right 2001  . SHOULDER ARTHROSCOPY Left 07  . TONSILLECTOMY    . TOTAL KNEE ARTHROPLASTY Right 03/30/2014   DR NORRIS  . TOTAL KNEE ARTHROPLASTY Right 03/30/2014   Procedure: RIGHT TOTAL KNEE ARTHROPLASTY;  Surgeon: Beverely Low, MD;  Location: [facility] OR;  Service: Orthopedics;  Laterality: Right;   Social History   Socioeconomic History  . Marital status: Married    Spouse name: Not on file  . Number of children: Not on file  . Years of education: Not on file  . Highest education level: Not on file  Occupational History  . Not on file  Tobacco Use  . Smoking status: Never Smoker  . Smokeless tobacco: Never Used  Vaping Use  . Vaping Use: Never used  Substance and Sexual Activity  . Alcohol use: Yes    Comment: RARELY  . Drug use: Never  . Sexual activity: Not on file  Other Topics Concern  . Not on file  Social History Narrative  . Not on file   Social Determinants of Health   Financial Resource Strain: Not on file  Food Insecurity: Not on file   Transportation Needs: Not on file  Physical Activity: Not on file  Stress: Not on file  Social Connections: Not on file   Family History  Problem Relation Age of Onset  . Cancer Sister        breast  . Diabetes Brother    Allergies  Allergen Reactions  . Azithromycin Nausea And Vomiting  . Codeine Nausea And Vomiting   Prior to Admission medications   Medication Sig Start Date End Date Taking? Authorizing Provider  cholecalciferol (VITAMIN D) 1000 units tablet Take 1,000 Units by mouth daily.   Yes [provider]  DULoxetine (CYMBALTA) 60 MG capsule Take 60 mg by mouth daily. 07/19/19  Yes [provider]  gabapentin (NEURONTIN) 100 MG capsule TAKE ONE CAPSULE BY MOUTH EVERYDAY AT BEDTIME 04/08/20  Yes Kerrin Champagne, MD  methocarbamol (ROBAXIN) 500 MG tablet Take 1 tablet (500 mg total) by mouth daily as needed for muscle spasms. 10/09/19  Yes Gearldine Bienenstock, PA-C  Multiple Vitamin (MULTIVITAMIN WITH MINERALS) TABS tablet Take 1 tablet by mouth daily. Centrum   Yes [provider]  Omega-3 Fatty Acids (FISH OIL) 1000 MG CAPS Take 1,000 mg by mouth at bedtime.   Yes [provider]  promethazine-dextromethorphan (PROMETHAZINE-DM) 6.25-15 MG/5ML syrup as needed. 12/28/19  Yes [provider]  rosuvastatin (CRESTOR) 5 MG tablet Take by mouth daily.  01/13/16  Yes [provider]  vitamin B-12 (CYANOCOBALAMIN) 1000 MCG tablet Take 1,000 mcg by mouth daily.   Yes [provider]  diclofenac Sodium (VOLTAREN) 1 % GEL Apply 2 g topically 4 (four) times daily. 12/28/19   Kerrin Champagne, MD  traMADol (ULTRAM) 50 MG tablet Take 50 mg by mouth 2 (two) times daily as needed.    [provider]     Positive ROS: All other systems have been reviewed and were otherwise negative with the exception of those mentioned in the HPI and as above.  Physical Exam: General: Alert, no acute distress Cardiovascular: No pedal  edema Respiratory: No cyanosis, no use of accessory musculature GI: No organomegaly, abdomen is soft and non-tender Skin: No lesions in the area of chief complaint Neurologic: Sensation intact distally Psychiatric: Patient is competent for consent with normal mood and affect Lymphatic: No axillary or cervical lymphadenopathy  MUSCULOSKELETAL: Tinel's positive right wrist medial nerve. Phalen sign is positive at 5 seconds. Right thumb MCP with grating sensation with ROM and pain with flexion and extension, Flexor tendon is non tender. Mild DJD right thumb MCC joint. Most of her pain is with ROM of the right thumb MCP joint. Grind test for [facility] arthrosis is negative for replication of pain, no triggering of the right thumb flexor tendon.   Assessment: RIGHT CARPAL TUNNEL SYNDROME, RIGHT THUMB METACARPOPHALANGEAL ARTHRITIS  Plan: Plan for Procedure(s): RIGHT OPEN CARPAL TUNNEL RELEASE, RIGHT THUMB METACARPOPHALANGEAL JOINT INJECTION  The risks benefits and alternatives were discussed with the patient including but not limited to the risks of nonoperative treatment, versus surgical intervention including infection, bleeding, nerve injury,  blood clots, cardiopulmonary complications, morbidity, mortality, among others, and they were willing to proceed.   Vira Browns, MD Cell [phone] Office [phone] 05/03/2020 1:10 PM

## 2020-05-03 NOTE — Brief Op Note (Signed)
05/03/2020  2:15 PM  PATIENT:  Meghan Alvarez  86 y.o. female  PRE-OPERATIVE DIAGNOSIS:  RIGHT CARPAL TUNNEL SYNDROME, RIGHT THUMB METACARPOPHALANGEAL ARTHRITIS  POST-OPERATIVE DIAGNOSIS:  RIGHT CARPAL TUNNEL SYNDROME, RIGHT THUMB METACARPOPHALANGEAL ARTHRITIS  PROCEDURE:  Procedure(s): RIGHT OPEN CARPAL TUNNEL RELEASE, RIGHT THUMB METACARPOPHALANGEAL JOINT INJECTION (Right)  SURGEON:  Surgeon(s) and Role:    * Kerrin Champagne, MD - Primary  PHYSICIAN ASSISTANT: Zonia Kief, PA-C  ANESTHESIA:   local, regional and IV sedation, Dr. Noreene Larsson.  EBL:  5 mL   BLOOD ADMINISTERED:none  DRAINS: none   LOCAL MEDICATIONS USED:  MARCAINE 0.25%    and Amount: 7 ml  SPECIMEN:  No Specimen  DISPOSITION OF SPECIMEN:  N/A  COUNTS:  YES  TOURNIQUET:   Total Tourniquet Time Documented: Forearm (Right) - 22 minutes Total: Forearm (Right) - 22 minutes   DICTATION: .Reubin Milan Dictation  PLAN OF CARE: Discharge to home after PACU  PATIENT DISPOSITION:  PACU - hemodynamically stable.   Delay start of Pharmacological VTE agent (>24hrs) due to surgical blood loss or risk of bleeding: yes

## 2020-05-03 NOTE — Discharge Instructions (Addendum)
    Keep dressing dry. Elevated wrist above heart. Apply ice to palm side of wrist two hours on and one half hour off for 48 hours. May Apply ice at night and go to sleep with out changing. Be sure to keep ice off fingers to prevent frost bite.  Return to office in two weeks for removal of sutures right wrist.      Post Anesthesia Home Care Instructions  Activity: Get plenty of rest for the remainder of the day. A responsible individual must stay with you for 24 hours following the procedure.  For the next 24 hours, DO NOT: -Drive a car -Advertising copywriter -Drink alcoholic beverages -Take any medication unless instructed by your physician -Make any legal decisions or sign important papers.  Meals: Start with liquid foods such as gelatin or soup. Progress to regular foods as tolerated. Avoid greasy, spicy, heavy foods. If nausea and/or vomiting occur, drink only clear liquids until the nausea and/or vomiting subsides. Call your physician if vomiting continues.  Special Instructions/Symptoms: Your throat may feel dry or sore from the anesthesia or the breathing tube placed in your throat during surgery. If this causes discomfort, gargle with warm salt water. The discomfort should disappear within 24 hours.  If you had a scopolamine patch placed behind your ear for the management of post- operative nausea and/or vomiting:  1. The medication in the patch is effective for 72 hours, after which it should be removed.  Wrap patch in a tissue and discard in the trash. Wash hands thoroughly with soap and water. 2. You may remove the patch earlier than 72 hours if you experience unpleasant side effects which may include dry mouth, dizziness or visual disturbances. 3. Avoid touching the patch. Wash your hands with soap and water after contact with the patch.

## 2020-05-03 NOTE — Op Note (Signed)
05/03/2020  2:16 PM  OPERATIVE REPORT   PATIENT:  Meghan Alvarez  86 y.o. female  MRN: [id number]  PRE-OPERATIVE DIAGNOSIS:  RIGHT CARPAL TUNNEL SYNDROME, RIGHT THUMB METACARPOPHALANGEAL ARTHRITIS  POST-OPERATIVE DIAGNOSIS:  RIGHT CARPAL TUNNEL SYNDROME, RIGHT THUMB METACARPOPHALANGEAL ARTHRITIS  PROCEDURE:  Procedure(s): RIGHT OPEN CARPAL TUNNEL RELEASE, RIGHT THUMB METACARPOPHALANGEAL JOINT INJECTION    SURGEON:  Jessy Oto, MD      ASSISTANT:  Benjiman Core, PA-C  (Present throughout the entire procedure and necessary for completion of procedure in a timely manner)      ANESTHESIA:  Regional Bier Block  Right mid forarm Level, Supplemented with local Marcaine 0.25 % 7cc, right thumb MCP joint 1 cc marcaine 0.25% and 0.5cc depomedrol 72WT/KT     COMPLICATIONS:  None.      TOURNIQUET TIME: 22 minutes at  243mHg   PROCEDURE: The patient was met in the holding area, and the appropriate right wrist and right thumb MCP joint identified and marked with an "x" and my initials.   The patient was then transported to OR and was placed on the operative table in a supine position. The patient was then placed under Bier block anesthesia without difficulty. The patient received appropriate preoperative antibiotic prophylaxis.     The right upper extremity was then prepped using sterile conditions and draped using sterile technique.  Time-out procedure was called and correct. The right dorsal radial thumb MCP joint indentified using flexion and extension and a 25 gauge needle introduced into the joint dorsoradially without difficulty and a total of 1cc of marcaine 0.25% and 0.5cc of depomedrol 441mcc instilled into the thumb MCP joint.  Using loope magnification and head lamp a 1.5 inch incision curved at the right volar wrist crease with 15 blade scalpel.  Incision through skin and subcutaneous tissue to the volar forearm fascia and  transverse carpal ligament. Fascia then carefully lifed and  incised with Stevens scissors inline with the fourth digit. The skin and subcutaneous tissue retracted and the volar fascia divided under direct vision from distal to proximal. A freer elevator then carefully placed between the median nerve and the transverse carpal ligament protecting the  median nerve as the transverse carpal ligament was divided with a 15 blade scalpel in line with the fourth digit. Retracting the distal skin and subcutaneous tissues distally under direct visualization the remaining portions of the transverse carpal ligament were divided with tenotomy scissors again in line with the fourth digit. The palmar fascia was then divided until the traversing superficial palmar arch was identified and preserved intact.  The motor branch of the median nerve was carefully examined and identified intact. Tourniquet was then released. Bleeding controlled with bipolar electrocautery. The incision was then irrigated with copious amounts of irrigant solution, No active bleeding was present. The incision closed with a single layer skin closure of 4-0 nylon horizontal mattress sutures. Dry dressing of adaptic, 4x4s held in place with sterile webril.  A well padded volar splint applied with ace wrap.  The patient reactivated and returned to the PACU in good condition.  All instruments and sponge counts were correct.          JaBasil Dess4/22/2022, 2:16 PM

## 2020-05-03 NOTE — Anesthesia Postprocedure Evaluation (Signed)
Anesthesia Post Note  Patient: Meghan Alvarez  Procedure(s) Performed: RIGHT OPEN CARPAL TUNNEL RELEASE, RIGHT THUMB METACARPOPHALANGEAL JOINT INJECTION (Right Wrist)     Patient location during evaluation: PACU Anesthesia Type: MAC Level of consciousness: awake and alert Pain management: pain level controlled Vital Signs Assessment: post-procedure vital signs reviewed and stable Respiratory status: spontaneous breathing, nonlabored ventilation, respiratory function stable and patient connected to nasal cannula oxygen Cardiovascular status: stable and blood pressure returned to baseline Postop Assessment: no apparent nausea or vomiting Anesthetic complications: no   No complications documented.  Last Vitals:  Vitals:   05/03/20 1430 05/03/20 1504  BP: (!) 159/68 139/88  Pulse: 62 72  Resp: 13 15  Temp:  36.4 C  SpO2: 100% 100%    Last Pain:  Vitals:   05/03/20 1442  TempSrc:   PainSc: 0-No pain                 Harith Mccadden COKER

## 2020-05-03 NOTE — Interval H&P Note (Signed)
History and Physical Interval Note:  05/03/2020 1:13 PM  Meghan Alvarez  has presented today for surgery, with the diagnosis of RIGHT CARPAL TUNNEL SYNDROME, RIGHT THUMB METACARPOPHALANGEAL ARTHRITIS.  The various methods of treatment have been discussed with the patient and family. After consideration of risks, benefits and other options for treatment, the patient has consented to  Procedure(s): RIGHT OPEN CARPAL TUNNEL RELEASE, RIGHT THUMB METACARPOPHALANGEAL JOINT INJECTION (Right) as a surgical intervention.  The patient's history has been reviewed, patient examined, no change in status, stable for surgery.  I have reviewed the patient's chart and labs.  Questions were answered to the patient's satisfaction.     Vira Browns

## 2020-05-03 NOTE — Anesthesia Preprocedure Evaluation (Addendum)
Anesthesia Evaluation  Patient identified by MRN, date of birth, ID band Patient awake    Reviewed: Allergy & Precautions, NPO status , Patient's Chart, lab work & pertinent test results  Airway Mallampati: II  TM Distance: >3 FB Neck ROM: Full    Dental  (+) Edentulous Upper, Dental Advisory Given   Pulmonary    breath sounds clear to auscultation       Cardiovascular  Rhythm:Regular Rate:Normal     Neuro/Psych    GI/Hepatic   Endo/Other  diabetes  Renal/GU      Musculoskeletal   Abdominal   Peds  Hematology   Anesthesia Other Findings   Reproductive/Obstetrics                            Anesthesia Physical Anesthesia Plan  ASA: II  Anesthesia Plan: MAC and Bier Block and Bier Block-LIDOCAINE ONLY   Post-op Pain Management:    Induction: Intravenous  PONV Risk Score and Plan: Ondansetron and Dexamethasone  Airway Management Planned: Natural Airway and Simple Face Mask  Additional Equipment:   Intra-op Plan:   Post-operative Plan:   Informed Consent: I have reviewed the patients History and Physical, chart, labs and discussed the procedure including the risks, benefits and alternatives for the proposed anesthesia with the patient or authorized representative who has indicated his/her understanding and acceptance.     Dental advisory given  Plan Discussed with: Anesthesiologist and CRNA  Anesthesia Plan Comments:         Anesthesia Quick Evaluation

## 2020-05-07 ENCOUNTER — Encounter (HOSPITAL_BASED_OUTPATIENT_CLINIC_OR_DEPARTMENT_OTHER): Payer: Self-pay | Admitting: Specialist

## 2020-05-13 ENCOUNTER — Ambulatory Visit: Payer: Medicare Other | Admitting: Internal Medicine

## 2020-05-17 ENCOUNTER — Encounter: Payer: Self-pay | Admitting: Specialist

## 2020-05-17 ENCOUNTER — Other Ambulatory Visit: Payer: Self-pay

## 2020-05-17 ENCOUNTER — Ambulatory Visit (INDEPENDENT_AMBULATORY_CARE_PROVIDER_SITE_OTHER): Payer: Medicare Other | Admitting: Specialist

## 2020-05-17 VITALS — BP 126/80 | HR 74 | Ht 65.0 in | Wt 181.0 lb

## 2020-05-17 DIAGNOSIS — G5601 Carpal tunnel syndrome, right upper limb: Secondary | ICD-10-CM

## 2020-05-17 NOTE — Progress Notes (Signed)
   Post-Op Visit Note   Patient: Meghan Alvarez           Date of Birth: March 31, 1934           MRN: [id number] Visit Date: 05/17/2020 PCP: Pcp, No   Assessment & Plan: 2 weeks post right open carpal tunnel release.   Chief Complaint:  Chief Complaint  Patient presents with  . Right Hand - Routine Post Op    She had CTR on 05/03/20  Still has some residual numbness in the tips of the right index and long finger. Incision is healed, 3 horizontal mattress sutures intact. Sutures removed. Visit Diagnoses: No diagnosis found.  Plan: Move the fingers as much as possible. Use the black wrist splint to keep the wrist steady for one more week then discontinue the splint. Expect about 80% of strength return by 6 weeks the last 20% returns by about 8 months post op. Take a Vitamin B complex tablet daily.  Follow-Up Instructions: No follow-ups on file.   Orders:  No orders of the defined types were placed in this encounter.  No orders of the defined types were placed in this encounter.   Imaging: No results found.  PMFS History: Patient Active Problem List   Diagnosis Date Noted  . Chest pain 01/19/2020  . Diabetes mellitus with coincident hypertension (HCC) 01/19/2020  . Other hyperlipidemia 01/19/2020  . Myofascial pain syndrome 02/04/2016  . Spondylosis of lumbar region without myelopathy or radiculopathy 02/04/2016  . DJD (degenerative joint disease), cervical 02/04/2016  . Age-related osteoporosis without current pathological fracture 02/04/2016  . Vitamin D deficiency 02/04/2016  . S/P TKR (total knee replacement) using cement 03/30/2014   Past Medical History:  Diagnosis Date  . Arthritis   . Complication of anesthesia   . Diabetes mellitus without complication (HCC)   . Fibromyalgia   . Hyperlipemia   . PONV (postoperative nausea and vomiting)     Family History  Problem Relation Age of Onset  . Cancer Sister        breast  . Diabetes Brother     Past  Surgical History:  Procedure Laterality Date  . APPENDECTOMY  04   ruptured   . CARPAL TUNNEL RELEASE Right 05/03/2020   Procedure: RIGHT OPEN CARPAL TUNNEL RELEASE, RIGHT THUMB METACARPOPHALANGEAL JOINT INJECTION;  Surgeon: Kerrin Champagne, MD;  Location: [facility];  Service: Orthopedics;  Laterality: Right;  . CERVICAL DISCECTOMY  91  . DILATION AND CURETTAGE OF UTERUS  05  . FRACTURE SURGERY Left 06   arm  . JOINT REPLACEMENT Left 08   knee repl  . SHOULDER ARTHROSCOPY Right 2001  . SHOULDER ARTHROSCOPY Left 07  . TONSILLECTOMY    . TOTAL KNEE ARTHROPLASTY Right 03/30/2014   DR NORRIS  . TOTAL KNEE ARTHROPLASTY Right 03/30/2014   Procedure: RIGHT TOTAL KNEE ARTHROPLASTY;  Surgeon: Beverely Low, MD;  Location: [facility] OR;  Service: Orthopedics;  Laterality: Right;   Social History   Occupational History  . Not on file  Tobacco Use  . Smoking status: Never Smoker  . Smokeless tobacco: Never Used  Vaping Use  . Vaping Use: Never used  Substance and Sexual Activity  . Alcohol use: Yes    Comment: RARELY  . Drug use: Never  . Sexual activity: Not on file

## 2020-06-06 ENCOUNTER — Encounter: Payer: Self-pay | Admitting: Surgery

## 2020-06-06 ENCOUNTER — Ambulatory Visit (INDEPENDENT_AMBULATORY_CARE_PROVIDER_SITE_OTHER): Payer: Medicare Other | Admitting: Surgery

## 2020-06-06 ENCOUNTER — Other Ambulatory Visit: Payer: Self-pay

## 2020-06-06 ENCOUNTER — Encounter: Payer: Medicare Other | Admitting: Surgery

## 2020-06-06 DIAGNOSIS — Z9889 Other specified postprocedural states: Secondary | ICD-10-CM

## 2020-06-06 NOTE — Progress Notes (Signed)
86 year old white female who is about 6-week status post right carpal tunnel release returns.  States that she is doing well.  Has some soreness in both of her wrist and hands.  States that she sees Dr. Corliss Skains rheumatologist for rheumatoid arthritis.  She is wondering if she should follow-up with her again.     Exam Very pleasant female alert and oriented in no acute distress.  Right hand surgical incision is well-healed.  She is good range of motion of her wrist and fingers.  Mild tenderness over the surgical site.    Plan Advised patient that I think that she is recovering well from her carpal tunnel release.  Expect her to have some residual soreness at this point but this should continue to get better.  With her bilateral hand and wrist soreness it may be a good idea for her to follow-up with rheumatologist to get their input.  Follow-up with Dr. Otelia Sergeant in 6 weeks for final check.  If she is doing well at that time she may call and cancel the appointment.  Patient voices understanding.  All questions answered.

## 2020-07-01 ENCOUNTER — Other Ambulatory Visit: Payer: Self-pay | Admitting: Specialist

## 2020-07-10 ENCOUNTER — Other Ambulatory Visit: Payer: Self-pay | Admitting: *Deleted

## 2020-07-10 MED ORDER — METHOCARBAMOL 500 MG PO TABS: 500.0000 mg | ORAL_TABLET | Freq: Every day | ORAL | 1 refills | Status: DC | PRN

## 2020-07-10 NOTE — Telephone Encounter (Signed)
Refill request received via fax  Last Visit: 02/07/2020  Next Visit: 08/07/2020  Last Fill: 10/09/2019  Okay to refill Methocarbamol?

## 2020-07-11 ENCOUNTER — Other Ambulatory Visit: Payer: Self-pay

## 2020-07-11 ENCOUNTER — Encounter: Payer: Self-pay | Admitting: Internal Medicine

## 2020-07-11 ENCOUNTER — Ambulatory Visit (INDEPENDENT_AMBULATORY_CARE_PROVIDER_SITE_OTHER): Payer: Medicare Other | Admitting: Internal Medicine

## 2020-07-11 VITALS — BP 130/70 | HR 68 | Ht 65.0 in | Wt 175.0 lb

## 2020-07-11 DIAGNOSIS — N1831 Chronic kidney disease, stage 3a: Secondary | ICD-10-CM | POA: Diagnosis not present

## 2020-07-11 DIAGNOSIS — E7849 Other hyperlipidemia: Secondary | ICD-10-CM

## 2020-07-11 DIAGNOSIS — I10 Essential (primary) hypertension: Secondary | ICD-10-CM

## 2020-07-11 DIAGNOSIS — E119 Type 2 diabetes mellitus without complications: Secondary | ICD-10-CM | POA: Diagnosis not present

## 2020-07-11 NOTE — Progress Notes (Signed)
Cardiology Office Note:    Date:  07/11/2020   ID:  Meghan Alvarez, DOB 09/22/1934, MRN [id number]  PCP:  Pcp, No  [facility] Cardiologist:  Christell Constant, MD  [facility] Electrophysiologist:  None   CC: Chest pain follow up  History of Present Illness:    Meghan Alvarez is a 86 y.o. female with a hx DM with HTN, HLD RA who presents for evaluation 01/19/20.  In interim of this visit, patient had negative stress test.  Seen 07/11/20.  Patient notes that she is doing well.  Since last visit notes keen surgeries changes.  Relevant interval testing or therapy include carpal tunnel surgery.  There are no interval hospital/ED visit.    No chest pain or pressure .  No SOB/DOE and no PND/Orthopnea.  No weight gain or leg swelling.  No palpitations or syncope .  Ambulatory blood pressure not done.  Past Medical History:  Diagnosis Date   Arthritis    Complication of anesthesia    Diabetes mellitus without complication (HCC)    Fibromyalgia    Hyperlipemia    PONV (postoperative nausea and vomiting)     Past Surgical History:  Procedure Laterality Date   APPENDECTOMY  04   ruptured    CARPAL TUNNEL RELEASE Right 05/03/2020   Procedure: RIGHT OPEN CARPAL TUNNEL RELEASE, RIGHT THUMB METACARPOPHALANGEAL JOINT INJECTION;  Surgeon: Kerrin Champagne, MD;  Location:  SURGERY CENTER;  Service: Orthopedics;  Laterality: Right;   CERVICAL DISCECTOMY  91   DILATION AND CURETTAGE OF UTERUS  05   FRACTURE SURGERY Left 06   arm   JOINT REPLACEMENT Left 08   knee repl   SHOULDER ARTHROSCOPY Right 2001   SHOULDER ARTHROSCOPY Left 07   TONSILLECTOMY     TOTAL KNEE ARTHROPLASTY Right 03/30/2014   DR NORRIS   TOTAL KNEE ARTHROPLASTY Right 03/30/2014   Procedure: RIGHT TOTAL KNEE ARTHROPLASTY;  Surgeon: Beverely Low, MD;  Location: [facility] OR;  Service: Orthopedics;  Laterality: Right;    Current Medications: Current Meds  Medication Sig   cholecalciferol (VITAMIN D) 1000  units tablet Take 1,000 Units by mouth daily.   diclofenac Sodium (VOLTAREN) 1 % GEL Apply 2 g topically 4 (four) times daily.   DULoxetine (CYMBALTA) 60 MG capsule Take 60 mg by mouth daily.   gabapentin (NEURONTIN) 100 MG capsule TAKE ONE CAPSULE BY MOUTH EVERYDAY AT BEDTIME   methocarbamol (ROBAXIN) 500 MG tablet Take 1 tablet (500 mg total) by mouth daily as needed for muscle spasms.   Multiple Vitamin (MULTIVITAMIN WITH MINERALS) TABS tablet Take 1 tablet by mouth daily. Centrum   Omega-3 Fatty Acids (FISH OIL) 1000 MG CAPS Take 1,000 mg by mouth at bedtime.   promethazine-dextromethorphan (PROMETHAZINE-DM) 6.25-15 MG/5ML syrup as needed.   rosuvastatin (CRESTOR) 5 MG tablet Take by mouth daily.    traMADol (ULTRAM) 50 MG tablet Take 50 mg by mouth 2 (two) times daily as needed.   vitamin B-12 (CYANOCOBALAMIN) 1000 MCG tablet Take 1,000 mcg by mouth daily.     Allergies:   Azithromycin and Codeine   Social History   Socioeconomic History   Marital status: Married    Spouse name: Not on file   Number of children: Not on file   Years of education: Not on file   Highest education level: Not on file  Occupational History   Not on file  Tobacco Use   Smoking status: Never   Smokeless tobacco: Never  Vaping  Use   Vaping Use: Never used  Substance and Sexual Activity   Alcohol use: Yes    Comment: RARELY   Drug use: Never   Sexual activity: Not on file  Other Topics Concern   Not on file  Social History Narrative   Not on file   Social Determinants of Health   Financial Resource Strain: Not on file  Food Insecurity: Not on file  Transportation Needs: Not on file  Physical Activity: Not on file  Stress: Not on file  Social Connections: Not on file    Social: Loves to Bowl and bowled since 2019  Family History: The patient's family history includes Cancer in her sister; Diabetes in her brother. History of coronary artery disease notable for no members. History of  heart failure notable for no members. History of arrhythmia notable for no members. No history of bicuspid aortic valve or aortic aneurysm or dissection.  ROS:   Please see the history of present illness.    Notes persistent carpal tunnel discomfort  All other systems reviewed and are negative.  EKGs/Labs/Other Studies Reviewed:    The following studies were reviewed today:  EKG:   01/19/2020: SR rate 70, WNL  08/19/2017:  SR rate 89, nonspecific TWI  NM Stress Testing : Date: 02/02/2020 Results: The left ventricular ejection fraction is hyperdynamic (>65%). Nuclear stress EF: 72%. No wall motion abnormalities. There was no ST segment deviation noted during stress. The study is normal. There is no evidence of ischemia or infarction. This is a low risk study.    Recent Labs: 05/02/2020: BUN 9; Creatinine, Ser 0.90; Hemoglobin 13.0; Platelets 199; Potassium 4.5; Sodium 138  Recent Lipid Panel No results found for: CHOL, TRIG, HDL, CHOLHDL, VLDL, LDLCALC, LDLDIRECT  OSH Labs 12/28/19 Cholesterol 107 HDL 50 LDL 38 Triglycerides 103  Risk Assessment/Calculations:     N/A  Physical Exam:    VS:  BP 130/70   Pulse 68   Ht 5\' 5"  (1.651 m)   Wt 79.4 kg   SpO2 97%   BMI 29.12 kg/m     Wt Readings from Last 3 Encounters:  07/11/20 79.4 kg  05/17/20 82.1 kg  05/03/20 81.9 kg    GEN:  Well nourished, well developed in no acute distress HEENT: Normal NECK: No JVD LYMPHATICS: No lymphadenopathy CARDIAC: RRR, no murmurs, rubs, gallops RESPIRATORY:  Clear to auscultation without rales, wheezing or rhonchi  ABDOMEN: Soft, non-tender, non-distended MUSCULOSKELETAL:  No edema; No deformity  SKIN: Warm and dry NEUROLOGIC:  Alert and oriented x 3 PSYCHIATRIC:  Normal affect   ASSESSMENT:    1. Diabetes mellitus with coincident hypertension (HCC)   2. Other hyperlipidemia   3. Stage 3a chronic kidney disease (HCC)     PLAN:    In order of problems listed  above:  Diabetes with hypertension Hyperlipidemia CKD Stage IIIa Osteoarthritis and prior Carpal Tunnel  - well controlled on Omega 3 Fatty Aciids (BP), and Rosuvastatin 5 mg - ambulatory blood pressure not done, will start ambulatory BP monitoring; gave education on how to perform ambulatory blood pressure monitoring including the frequency and technique; goal ambulatory blood pressure < 135/85 on average  One year follow up unless new symptoms or abnormal test results warranting change in plan  Would be reasonable for  APP Follow up    Medication Adjustments/Labs and Tests Ordered: Current medicines are reviewed at length with the patient today.  Concerns regarding medicines are outlined above.  No orders of the defined  types were placed in this encounter.  No orders of the defined types were placed in this encounter.   There are no Patient Instructions on file for this visit.   Signed, Christell Constant, MD  07/11/2020 9:47 AM    [facility]

## 2020-07-11 NOTE — Patient Instructions (Signed)
Medication Instructions:  Your physician recommends that you continue on your current medications as directed. Please refer to the Current Medication list given to you today.  *If you need a refill on your cardiac medications before your next appointment, please call your pharmacy*   Lab Work: NONE If you have labs (blood work) drawn today and your tests are completely normal, you will receive your results only by: MyChart Message (if you have MyChart) OR A paper copy in the mail If you have any lab test that is abnormal or we need to change your treatment, we will call you to review the results.   Testing/Procedures: NONE   Follow-Up: At [facility], you and your health needs are our priority.  As part of our continuing mission to provide you with exceptional heart care, we have created designated Provider Care Teams.  These Care Teams include your primary Cardiologist (physician) and Advanced Practice Providers (APPs -  Physician Assistants and Nurse Practitioners) who all work together to provide you with the care you need, when you need it.  We recommend signing up for the patient portal called "MyChart".  Sign up information is provided on this After Visit Summary.  MyChart is used to connect with patients for Virtual Visits (Telemedicine).  Patients are able to view lab/test results, encounter notes, upcoming appointments, etc.  Non-urgent messages can be sent to your provider as well.   To learn more about what you can do with MyChart, go to [email or web address].    Your next appointment:   1 year(s)  The format for your next appointment:   In Person  Provider:   You may see Christell Constant, MD or one of the following Advanced Practice Providers on your designated Care Team:   Ronie Spies, PA-C Jacolyn Reedy, PA-C   Other Instructions Your BP goal is less than 135/85

## 2020-07-24 NOTE — Progress Notes (Signed)
Office Visit Note  Patient: Meghan Alvarez             Date of Birth: 06-17-1934           MRN: [id number]             PCP: Pcp, No Referring: Catha Gosselin, MD Visit Date: 08/07/2020 Occupation: @GUAROCC @  Subjective:  Pain in both hands and left shoulder.   History of Present Illness: Meghan Alvarez is a 86 y.o. female with history of osteoarthritis.  She states she underwent left carpal tunnel release in April 2022.  She states she had some knots on the palmar aspect of her both hands which has been popping.  She has been followed by Dr. May 2022.  Recently she has been having discomfort in her left shoulder and having difficulty getting dressed.  She continues to have some generalized pain and discomfort from underlying myofascial pain.  She has chronic neck and lower back pain.  Her bilateral knee joint replacements are doing well.  She is frustrated as she has difficulty bowling.  She was from out of aerobics on a regular basis.  Activities of Daily Living:  Patient reports morning stiffness for 15-20 minutes.   Patient Reports nocturnal pain.  Difficulty dressing/grooming: Denies Difficulty climbing stairs: Reports Difficulty getting out of chair: Reports Difficulty using hands for taps, buttons, cutlery, and/or writing: Reports  Review of Systems  Constitutional:  Positive for fatigue.  HENT:  Negative for mouth sores, mouth dryness and nose dryness.   Eyes:  Negative for pain, itching and dryness.  Respiratory:  Negative for shortness of breath and difficulty breathing.   Cardiovascular:  Negative for chest pain and palpitations.  Gastrointestinal:  Negative for blood in stool, constipation and diarrhea.  Endocrine: Negative for increased urination.  Genitourinary:  Negative for difficulty urinating.  Musculoskeletal:  Positive for joint pain, joint pain, myalgias, morning stiffness, muscle tenderness and myalgias. Negative for joint swelling.  Skin:  Negative for color  change, rash, redness and sensitivity to sunlight.  Allergic/Immunologic: Negative for susceptible to infections.  Neurological:  Positive for numbness. Negative for dizziness, headaches and memory loss.  Hematological:  Negative for bruising/bleeding tendency.  Psychiatric/Behavioral:  Negative for confusion.    PMFS History:  Patient Active Problem List   Diagnosis Date Noted   Stage 3a chronic kidney disease (HCC) 07/11/2020   Chest pain 01/19/2020   Diabetes mellitus with coincident hypertension (HCC) 01/19/2020   Other hyperlipidemia 01/19/2020   Myofascial pain syndrome 02/04/2016   Spondylosis of lumbar region without myelopathy or radiculopathy 02/04/2016   DJD (degenerative joint disease), cervical 02/04/2016   Age-related osteoporosis without current pathological fracture 02/04/2016   Vitamin D deficiency 02/04/2016   S/P TKR (total knee replacement) using cement 03/30/2014    Past Medical History:  Diagnosis Date   Arthritis    Complication of anesthesia    Diabetes mellitus without complication (HCC)    Fibromyalgia    Hyperlipemia    PONV (postoperative nausea and vomiting)     Family History  Problem Relation Age of Onset   Cancer Sister        breast   Diabetes Brother    Past Surgical History:  Procedure Laterality Date   APPENDECTOMY  04   ruptured    CARPAL TUNNEL RELEASE Right 05/03/2020   Procedure: RIGHT OPEN CARPAL TUNNEL RELEASE, RIGHT THUMB METACARPOPHALANGEAL JOINT INJECTION;  Surgeon: 05/05/2020, MD;  Location: [facility];  Service: Orthopedics;  Laterality: Right;   CERVICAL DISCECTOMY  91   DILATION AND CURETTAGE OF UTERUS  05   FRACTURE SURGERY Left 06   arm   JOINT REPLACEMENT Left 08   knee repl   SHOULDER ARTHROSCOPY Right 2001   SHOULDER ARTHROSCOPY Left 07   TONSILLECTOMY     TOTAL KNEE ARTHROPLASTY Right 03/30/2014   DR NORRIS   TOTAL KNEE ARTHROPLASTY Right 03/30/2014   Procedure: RIGHT TOTAL KNEE ARTHROPLASTY;   Surgeon: Beverely Low, MD;  Location: [facility] OR;  Service: Orthopedics;  Laterality: Right;   Social History   Social History Narrative   Not on file   Immunization History  Administered Date(s) Administered   Influenza, High Dose Seasonal PF 10/16/2018   Moderna Sars-Covid-2 Vaccination 01/23/2019, 02/20/2019, 11/07/2019   Zoster Recombinat (Shingrix) 11/24/2018     Objective: Vital Signs: BP (!) 143/80 (BP Location: Right Arm, Patient Position: Sitting, Cuff Size: Normal)   Pulse 91   Ht 5\' 5"  (1.651 m)   Wt 180 lb 6.4 oz (81.8 kg)   BMI 30.02 kg/m    Physical Exam Vitals and nursing note reviewed.  Constitutional:      Appearance: She is well-developed.  HENT:     Head: Normocephalic and atraumatic.  Eyes:     Conjunctiva/sclera: Conjunctivae normal.  Cardiovascular:     Rate and Rhythm: Normal rate and regular rhythm.     Heart sounds: Normal heart sounds.  Pulmonary:     Effort: Pulmonary effort is normal.     Breath sounds: Normal breath sounds.  Abdominal:     General: Bowel sounds are normal.     Palpations: Abdomen is soft.  Musculoskeletal:     Cervical back: Normal range of motion.  Lymphadenopathy:     Cervical: No cervical adenopathy.  Skin:    General: Skin is warm and dry.     Capillary Refill: Capillary refill takes less than 2 seconds.  Neurological:     Mental Status: She is alert and oriented to person, place, and time.  Psychiatric:        Behavior: Behavior normal.     Musculoskeletal Exam: She had limited lateral rotation of cervical spine.  She had discomfort with range of motion of the lumbar spine.  She had tenderness in the left trapezius region.  Shoulder joints, elbow joints, wrist joints, MCPs PIPs and DIPs were in good range of motion.  She had bilateral CMC PIP and DIP thickening.  She also had right third flexor tendon tenosynovitis.  Hip joints with good range of motion.  Her bilateral knee joints are replaced.  There was no tenderness  over ankles or MTPs.  CDAI Exam: CDAI Score: -- Patient Global: --; Provider Global: -- Swollen: --; Tender: -- Joint Exam 08/07/2020   No joint exam has been documented for this visit   There is currently no information documented on the homunculus. Go to the Rheumatology activity and complete the homunculus joint exam.  Investigation: No additional findings.  Imaging: No results found.  Recent Labs: Lab Results  Component Value Date   WBC 5.0 05/02/2020   HGB 13.0 05/02/2020   PLT 199 05/02/2020   NA 138 05/02/2020   K 4.5 05/02/2020   CL 106 05/02/2020   CO2 26 05/02/2020   GLUCOSE 93 05/02/2020   BUN 9 05/02/2020   CREATININE 0.90 05/02/2020   BILITOT 0.5 08/17/2017   ALKPHOS 62 08/17/2017   AST 33 08/17/2017   ALT 33 08/17/2017   PROT  7.7 08/17/2017   ALBUMIN 4.5 08/17/2017   CALCIUM 9.8 05/02/2020   GFRAA >60 08/17/2017    Speciality Comments: No specialty comments available.  Procedures:  Trigger Point Inj  Date/Time: 08/07/2020 4:07 PM Performed by: Pollyann Savoy, MD Authorized by: Pollyann Savoy, MD   Consent Given by:  Patient Site marked: the procedure site was marked   Timeout: prior to procedure the correct patient, procedure, and site was verified   Indications:  Muscle spasm and pain Total # of Trigger Points:  1 Location: neck   Needle Size:  27 G Approach:  Dorsal Medications #1:  0.5 mL lidocaine 1 %; 10 mg triamcinolone acetonide 40 MG/ML Patient tolerance:  Patient tolerated the procedure well with no immediate complications Allergies: Azithromycin and Codeine   Assessment / Plan:     Visit Diagnoses: Myofascial pain syndrome -she continues to have some generalized pain and discomfort from underlying myofascial pain.  She has been taking robaxin 500mg  by mouth daily as needed.  For muscle spasm and discomfort.  Trapezius muscle spasm-she is been experiencing increased pain and discomfort in the left trapezius region.  She is  having difficulty getting dressed.  Per her request left trapezius region was injected with cortisone as described above.  She tolerated the procedure well.  Postprocedure instructions were given.  Neck stretching exercises were discussed.  S/P carpal tunnel release - April 2022 by Dr. May 2022.  Primary osteoarthritis of both hands-she complains of increased pain and discomfort in her bilateral hands.  PIP and DIP prominence was noted.  No synovitis was noted.  She also has CMC arthritis.  She has been using hand braces.  She complains of left flexor tendon discomfort.  She she appears to have tenosynovitis.  She does not have trigger finger.  She is frustrated as she goes for bowling on a regular basis and it is difficult for her to continue to bowl due to discomfort in her hands.  Joint protection muscle strengthening was discussed.  DDD (degenerative disc disease), cervical -she is followed by Dr. Otelia Sergeant.  DDD (degenerative disc disease), lumbar-she complains of chronic discomfort.-  Status post total bilateral knee replacement using cement-doing well.  Age-related osteoporosis without current pathological fracture - DEXA was ordered by PCP.  According to the patient she was advised to discontinue the use of Fosamax since she had been taking it for over 5 years.  History of vitamin D deficiency  Essential hypertension-blood pressure was mildly elevated today.  She has been active and doing water aerobics on a regular basis.  Hearing loss of both ears-despite wearing hearing aids she has difficulty hearing.  It took me a long time to explain everything.  Orders: No orders of the defined types were placed in this encounter.  No orders of the defined types were placed in this encounter.    Follow-Up Instructions: Return in about 6 months (around 02/07/2021) for Osteoarthritis.   02/09/2021, MD  Note - This record has been created using Pollyann Savoy.  Chart creation errors have  been sought, but may not always  have been located. Such creation errors do not reflect on  the standard of medical care.

## 2020-08-07 ENCOUNTER — Encounter: Payer: Self-pay | Admitting: Rheumatology

## 2020-08-07 ENCOUNTER — Other Ambulatory Visit: Payer: Self-pay

## 2020-08-07 ENCOUNTER — Ambulatory Visit: Payer: Medicare Other | Admitting: Rheumatology

## 2020-08-07 VITALS — BP 143/80 | HR 91 | Ht 65.0 in | Wt 180.4 lb

## 2020-08-07 DIAGNOSIS — M19041 Primary osteoarthritis, right hand: Secondary | ICD-10-CM

## 2020-08-07 DIAGNOSIS — Z9889 Other specified postprocedural states: Secondary | ICD-10-CM | POA: Diagnosis not present

## 2020-08-07 DIAGNOSIS — M19042 Primary osteoarthritis, left hand: Secondary | ICD-10-CM

## 2020-08-07 DIAGNOSIS — I10 Essential (primary) hypertension: Secondary | ICD-10-CM

## 2020-08-07 DIAGNOSIS — M62838 Other muscle spasm: Secondary | ICD-10-CM

## 2020-08-07 DIAGNOSIS — M5136 Other intervertebral disc degeneration, lumbar region: Secondary | ICD-10-CM

## 2020-08-07 DIAGNOSIS — Z96653 Presence of artificial knee joint, bilateral: Secondary | ICD-10-CM

## 2020-08-07 DIAGNOSIS — H918X3 Other specified hearing loss, bilateral: Secondary | ICD-10-CM

## 2020-08-07 DIAGNOSIS — M5030 Other cervical disc degeneration, unspecified cervical region: Secondary | ICD-10-CM

## 2020-08-07 DIAGNOSIS — Z8639 Personal history of other endocrine, nutritional and metabolic disease: Secondary | ICD-10-CM

## 2020-08-07 DIAGNOSIS — M810 Age-related osteoporosis without current pathological fracture: Secondary | ICD-10-CM

## 2020-08-07 DIAGNOSIS — M7918 Myalgia, other site: Secondary | ICD-10-CM | POA: Diagnosis not present

## 2020-08-07 DIAGNOSIS — M51369 Other intervertebral disc degeneration, lumbar region without mention of lumbar back pain or lower extremity pain: Secondary | ICD-10-CM

## 2020-08-07 MED ORDER — TRIAMCINOLONE ACETONIDE 40 MG/ML IJ SUSP
10.0000 mg | INTRAMUSCULAR | Status: AC | PRN
  Administered 2020-08-07: 10 mg via INTRAMUSCULAR

## 2020-08-07 MED ORDER — LIDOCAINE HCL 1 % IJ SOLN
0.5000 mL | INTRAMUSCULAR | Status: AC | PRN
  Administered 2020-08-07: .5 mL

## 2020-08-19 ENCOUNTER — Ambulatory Visit: Payer: Medicare Other | Admitting: Specialist

## 2020-09-26 ENCOUNTER — Other Ambulatory Visit: Payer: Self-pay | Admitting: Physician Assistant

## 2020-09-26 NOTE — Telephone Encounter (Signed)
Next Visit: 02/07/2020  Last Visit: 08/07/2020  Last Fill:  Dx: Myofascial pain syndrome   Current Dose per office note on 08/07/2020: robaxin 500mg  by mouth daily as needed  Okay to refill Robaxin?

## 2020-09-28 ENCOUNTER — Other Ambulatory Visit: Payer: Self-pay | Admitting: Specialist

## 2020-12-23 ENCOUNTER — Other Ambulatory Visit: Payer: Self-pay | Admitting: Rheumatology

## 2020-12-23 ENCOUNTER — Other Ambulatory Visit: Payer: Self-pay | Admitting: Specialist

## 2020-12-23 NOTE — Telephone Encounter (Signed)
Next Visit: 02/07/2020   Last Visit: 08/07/2020   Last Fill: 09/26/2020   Dx: Myofascial pain syndrome    Current Dose per office note on 08/07/2020: robaxin 500mg  by mouth daily as needed   Okay to refill Robaxin?

## 2021-01-10 ENCOUNTER — Telehealth: Payer: Self-pay | Admitting: *Deleted

## 2021-01-10 NOTE — Telephone Encounter (Signed)
Received a potential drug to age interaction from Methocarbamol.   Possible interaction between: age >57 and Methocarbamol.  Possible Risks: Risk of sedation and increased risk of fracture.  Reviewed by: Garfield Cornea, PA-C  Recommendations: Make patient aware of alert and the risks. She should take Robaxin on an as needed basis.   Notified patient of possible interactions between age >72 and Methocarbamol. Advised risk include: Risk of sedation and increased risk of fracture. Patient advised of the following recommendations: she should take Robaxin on an as needed basis. . Patient expressed understanding.

## 2021-01-24 NOTE — Progress Notes (Signed)
Office Visit Note  Patient: Meghan Alvarez             Date of Birth: 07-30-1934           MRN: [id number]             PCP: Pcp, No Referring: No ref. provider found Visit Date: 02/06/2021 Occupation: @GUAROCC @  Subjective:  Trapezius muscle tension and tenderness   History of Present Illness: [name] is a 87 y.o. female with history of myofascial pain, osteoarthritis, and DDD.  She continues to experience trapezius muscle tension and tenderness bilaterally.  She is experiencing muscle spasms intermittently and takes methocarbamol 500 mg 1 tablet daily as needed for muscle spasms.  She had trigger point injections on 08/07/2020 which provided significant relief.  She requested trigger point injections today.  She has been applying ice and heat as needed and takes tramadol 50 mg twice daily as needed for pain relief.  She remains on Cymbalta as prescribed.  She has been taking gabapentin at bedtime as prescribed by Dr. Louanne Skye.   Activities of Daily Living:  Patient reports morning stiffness for 30 minutes.   Patient Reports nocturnal pain.  Difficulty dressing/grooming: Denies Difficulty climbing stairs: Denies Difficulty getting out of chair: Denies Difficulty using hands for taps, buttons, cutlery, and/or writing: Denies  Review of Systems  Constitutional:  Positive for fatigue.  HENT:  Negative for mouth sores, mouth dryness and nose dryness.   Eyes:  Positive for dryness. Negative for pain and itching.  Respiratory:  Negative for shortness of breath and difficulty breathing.   Cardiovascular:  Negative for chest pain and palpitations.  Gastrointestinal:  Negative for blood in stool, constipation and diarrhea.  Endocrine: Negative for increased urination.  Genitourinary:  Negative for difficulty urinating.  Musculoskeletal:  Positive for joint pain, joint pain, joint swelling, myalgias, morning stiffness, muscle tenderness and myalgias.  Skin:  Negative for color change,  rash and redness.  Allergic/Immunologic: Negative for susceptible to infections.  Neurological:  Positive for numbness. Negative for dizziness, headaches, memory loss and weakness.  Hematological:  Negative for bruising/bleeding tendency.  Psychiatric/Behavioral:  Negative for confusion.    PMFS History:  Patient Active Problem List   Diagnosis Date Noted   Stage 3a chronic kidney disease ([place]) 07/11/2020   Chest pain 01/19/2020   Diabetes mellitus with coincident hypertension ([place]) 01/19/2020   Other hyperlipidemia 01/19/2020   Myofascial pain syndrome 02/04/2016   Spondylosis of lumbar region without myelopathy or radiculopathy 02/04/2016   DJD (degenerative joint disease), cervical 02/04/2016   Age-related osteoporosis without current pathological fracture 02/04/2016   Vitamin D deficiency 02/04/2016   S/P TKR (total knee replacement) using cement 03/30/2014    Past Medical History:  Diagnosis Date   Arthritis    Complication of anesthesia    Diabetes mellitus without complication (HCC)    Fibromyalgia    Hyperlipemia    PONV (postoperative nausea and vomiting)     Family History  Problem Relation Age of Onset   Cancer Sister        breast   Diabetes Brother    Past Surgical History:  Procedure Laterality Date   APPENDECTOMY  01/12/2002   ruptured    CARPAL TUNNEL RELEASE Right 05/03/2020   Procedure: RIGHT OPEN CARPAL TUNNEL RELEASE, RIGHT THUMB METACARPOPHALANGEAL JOINT INJECTION;  Surgeon: Jessy Oto, MD;  Location: [facility];  Service: Orthopedics;  Laterality: Right;   CATARACT EXTRACTION, BILATERAL  2022   CERVICAL  DISCECTOMY  01/12/1989   DILATION AND CURETTAGE OF UTERUS  01/13/2003   FRACTURE SURGERY Left 01/13/2004   arm   JOINT REPLACEMENT Left 01/12/2006   knee repl   SHOULDER ARTHROSCOPY Right 01/13/1999   SHOULDER ARTHROSCOPY Left 01/12/2005   TONSILLECTOMY     TOTAL KNEE ARTHROPLASTY Right 03/30/2014   DR NORRIS   TOTAL KNEE  ARTHROPLASTY Right 03/30/2014   Procedure: RIGHT TOTAL KNEE ARTHROPLASTY;  Surgeon: Netta Cedars, MD;  Location: [facility];  Service: Orthopedics;  Laterality: Right;   Social History   Social History Narrative   Not on file   Immunization History  Administered Date(s) Administered   Influenza, High Dose Seasonal PF 10/16/2018   Moderna Sars-Covid-2 Vaccination 01/23/2019, 02/20/2019, 11/07/2019   Zoster Recombinat (Shingrix) 11/24/2018     Objective: Vital Signs: BP (!) 142/76 (BP Location: Left Arm, Patient Position: Sitting, Cuff Size: Large)    Pulse 78    Ht 5\' 5"  (1.651 m)    Wt 189 lb (85.7 kg)    BMI 31.45 kg/m    Physical Exam Vitals and nursing note reviewed.  Constitutional:      Appearance: She is well-developed.  HENT:     Head: Normocephalic and atraumatic.  Eyes:     Conjunctiva/sclera: Conjunctivae normal.  Pulmonary:     Effort: Pulmonary effort is normal.  Abdominal:     Palpations: Abdomen is soft.  Musculoskeletal:     Cervical back: Normal range of motion.  Skin:    General: Skin is warm and dry.     Capillary Refill: Capillary refill takes less than 2 seconds.  Neurological:     Mental Status: She is alert and oriented to person, place, and time.  Psychiatric:        Behavior: Behavior normal.     Musculoskeletal Exam:C-spine has limited range of motion with lateral rotation.  Trapezius muscle tension and tenderness bilaterally.  Thoracic and lumbar spine have good range of motion with no discomfort.  Shoulder joints, elbow joints, wrist joints, MCPs, PIPs, DIPs have good range of motion with no synovitis.  PIP and DIP thickening consistent with osteoarthritis of both hands.  Complete fist formation bilaterally.  Hip joints have good ROM with no groin pain.  Both knee replacements have good ROM.  No warmth or effusion of knee joints.  Ankle joints have good ROM with no tenderness or joint swelling.   CDAI Exam: CDAI Score: -- Patient Global: --;  Provider Global: -- Swollen: --; Tender: -- Joint Exam 02/06/2021   No joint exam has been documented for this visit   There is currently no information documented on the homunculus. Go to the Rheumatology activity and complete the homunculus joint exam.  Investigation: No additional findings.  Imaging: No results found.  Recent Labs: Lab Results  Component Value Date   WBC 5.0 05/02/2020   HGB 13.0 05/02/2020   PLT 199 05/02/2020   NA 138 05/02/2020   K 4.5 05/02/2020   CL 106 05/02/2020   CO2 26 05/02/2020   GLUCOSE 93 05/02/2020   BUN 9 05/02/2020   CREATININE 0.90 05/02/2020   BILITOT 0.5 08/17/2017   ALKPHOS 62 08/17/2017   AST 33 08/17/2017   ALT 33 08/17/2017   PROT 7.7 08/17/2017   ALBUMIN 4.5 08/17/2017   CALCIUM 9.8 05/02/2020   GFRAA >60 08/17/2017    Speciality Comments: No specialty comments available.  Procedures:  Trigger Point Inj  Date/Time: 02/06/2021 2:33 PM Performed by: Ofilia Neas,  PA-C Authorized by: Ofilia Neas, PA-C   Consent Given by:  Patient Site marked: the procedure site was marked   Timeout: prior to procedure the correct patient, procedure, and site was verified   Indications:  Pain Total # of Trigger Points:  2 Location: neck   Needle Size:  27 G Approach:  Dorsal Medications #1:  0.5 mL lidocaine 1 %; 10 mg triamcinolone acetonide 40 MG/ML Medications #2:  0.5 mL lidocaine 1 %; 10 mg triamcinolone acetonide 40 MG/ML Patient tolerance:  Patient tolerated the procedure well with no immediate complications Allergies: Azithromycin and Codeine   Assessment / Plan:     Visit Diagnoses: Myofascial pain syndrome: Patient presents today with trapezius muscle tension and tenderness bilaterally.  She requested trigger point injections today.  She tolerated the procedures well.  She continues to experience intermittent myalgias, arthralgias, and joint stiffness especially with cooler weather temperatures.  She remains on Cymbalta  60 mg 1 capsule daily.  She has been taking methocarbamol 500 mg 1 tablet daily as needed for muscle spasms and tramadol 50 mg 1 tablet twice daily as needed for pain relief.  She remains on gabapentin 100 mg at bedtime prescribed by Dr. Louanne Skye.  Discussed the importance of regular exercise and good sleep hygiene.  She will follow-up in the office in 6 months.  Trapezius muscle spasm: She resents today with trapezius muscle tension and tenderness bilaterally.  She has been experiencing muscle spasms intermittently.  She had a fall in early December 2022 and landed on her right side which exacerbated her trapezius pain.  She was evaluated at a walk-in clinic and was not found to have any fractures at that time.  She has been taking methocarbamol 500 mg 1 tablet daily as needed for muscle spasms. She had trigger point injections performed on 08/07/2020 which provided significant relief.  She requested repeat injections today.  She tolerated procedure well.  Procedure note completed above.  Aftercare was discussed.  S/P carpal tunnel release - April 2022 by Dr. Louanne Skye.  Asymptomatic at this time  Primary osteoarthritis of both hands: She has PIP and DIP thickening consistent with osteoarthritis of both hands.  No tenderness or inflammation was noted on examination.  She was able to make a complete fist bilaterally.  Discussed the importance of joint protection and muscle strengthening.  DDD (degenerative disc disease), cervical: She has limited range of motion of the C-spine with lateral rotation.  Trapezius muscle tension and tenderness bilaterally.  She requested trigger point injections today.  She tolerated the procedures well.  DDD (degenerative disc disease), lumbar: Followed by Dr. Louanne Skye.  She is not experiencing any increased discomfort in her lower back at this time.  No symptoms of radiculopathy.  She takes tramadol 50 mg twice daily as needed for pain relief and methocarbamol 500 mg 1 tablet daily as  needed for muscle spasms.  She remains on gabapentin as prescribed.  Status post total bilateral knee replacement using cement: She has good range of motion of both knee replacements with no discomfort at this time.  No warmth or effusion was noted.  Age-related osteoporosis without current pathological fracture - DEXA was ordered by PCP.  According to the patient she was advised to discontinue the use of Fosamax since she had been taking it for over 5 years.  She is taking vitamin D 1000 units daily.  Other medical conditions are listed as follows:   History of vitamin D deficiency: She is taking vitamin  D 1000 units daily.   Essential hypertension: BP was 142/76 today in the office.  Discussed the importance of close blood pressure monitoring.   Other specified hearing loss of both ears  Orders: Orders Placed This Encounter  Procedures   Trigger Point Inj   No orders of the defined types were placed in this encounter.    Follow-Up Instructions: Return in about 6 months (around 08/06/2021) for Myofascial pain .   Ofilia Neas, PA-C  Note - This record has been created using Dragon software.  Chart creation errors have been sought, but may not always  have been located. Such creation errors do not reflect on  the standard of medical care.

## 2021-02-06 ENCOUNTER — Other Ambulatory Visit: Payer: Self-pay

## 2021-02-06 ENCOUNTER — Ambulatory Visit: Payer: Medicare Other | Admitting: Physician Assistant

## 2021-02-06 ENCOUNTER — Encounter: Payer: Self-pay | Admitting: Physician Assistant

## 2021-02-06 VITALS — BP 142/76 | HR 78 | Ht 65.0 in | Wt 189.0 lb

## 2021-02-06 DIAGNOSIS — I10 Essential (primary) hypertension: Secondary | ICD-10-CM

## 2021-02-06 DIAGNOSIS — Z9889 Other specified postprocedural states: Secondary | ICD-10-CM

## 2021-02-06 DIAGNOSIS — M51369 Other intervertebral disc degeneration, lumbar region without mention of lumbar back pain or lower extremity pain: Secondary | ICD-10-CM

## 2021-02-06 DIAGNOSIS — M19041 Primary osteoarthritis, right hand: Secondary | ICD-10-CM

## 2021-02-06 DIAGNOSIS — M810 Age-related osteoporosis without current pathological fracture: Secondary | ICD-10-CM

## 2021-02-06 DIAGNOSIS — M7918 Myalgia, other site: Secondary | ICD-10-CM

## 2021-02-06 DIAGNOSIS — M62838 Other muscle spasm: Secondary | ICD-10-CM

## 2021-02-06 DIAGNOSIS — Z8639 Personal history of other endocrine, nutritional and metabolic disease: Secondary | ICD-10-CM

## 2021-02-06 DIAGNOSIS — M5030 Other cervical disc degeneration, unspecified cervical region: Secondary | ICD-10-CM

## 2021-02-06 DIAGNOSIS — M19042 Primary osteoarthritis, left hand: Secondary | ICD-10-CM

## 2021-02-06 DIAGNOSIS — H918X3 Other specified hearing loss, bilateral: Secondary | ICD-10-CM

## 2021-02-06 DIAGNOSIS — Z96653 Presence of artificial knee joint, bilateral: Secondary | ICD-10-CM

## 2021-02-06 DIAGNOSIS — M5136 Other intervertebral disc degeneration, lumbar region: Secondary | ICD-10-CM

## 2021-02-06 MED ORDER — TRIAMCINOLONE ACETONIDE 40 MG/ML IJ SUSP
10.0000 mg | INTRAMUSCULAR | Status: AC | PRN
  Administered 2021-02-06: 10 mg via INTRAMUSCULAR

## 2021-02-06 MED ORDER — LIDOCAINE HCL 1 % IJ SOLN
0.5000 mL | INTRAMUSCULAR | Status: AC | PRN
  Administered 2021-02-06: .5 mL

## 2021-02-18 ENCOUNTER — Telehealth: Payer: Self-pay | Admitting: *Deleted

## 2021-02-18 NOTE — Telephone Encounter (Signed)
Patient advised of risks and recommendations.

## 2021-02-18 NOTE — Telephone Encounter (Signed)
Received a potential drug to drug interaction from [employer].   Possible interaction between: Methocarbamol and >=67.  Possible Risks: Sedation and risk of fracture.   Reviewed by: Sherron Ales, PA-C  Recommendations: Please advise patient to take Robaxin PRN.   Attempted to contact the patient and left message for patient to call the office.

## 2021-03-02 ENCOUNTER — Other Ambulatory Visit: Payer: Self-pay | Admitting: Physician Assistant

## 2021-03-03 NOTE — Telephone Encounter (Signed)
Next Visit: 08/06/2021  Last Visit: 02/06/2021  Last Fill:12/23/2020  Dx: Trapezius muscle spasm  Current Dose per office note on 02/06/2021: methocarbamol 500 mg 1 tablet daily as needed for muscle spasms  Okay to refill Methocarbamol?

## 2021-03-24 ENCOUNTER — Other Ambulatory Visit: Payer: Self-pay | Admitting: Specialist

## 2021-05-27 ENCOUNTER — Other Ambulatory Visit: Payer: Self-pay | Admitting: Physician Assistant

## 2021-05-27 NOTE — Telephone Encounter (Signed)
Next Visit: 08/06/2021 ?  ?Last Visit: 02/06/2021 ?  ?Last Fill: 03/03/2021 ?  ?Dx: Trapezius muscle spasm ?  ?Current Dose per office note on 02/06/2021: methocarbamol 500 mg 1 tablet daily as needed for muscle spasms ?  ?Okay to refill Methocarbamol?   ?  ? ?

## 2021-06-13 NOTE — Progress Notes (Signed)
Office Visit Note  Patient: Meghan Alvarez             Date of Birth: July 12, 1934           MRN: [id number]             PCP: Pcp, No Referring: No ref. provider found Visit Date: 06/16/2021 Occupation: @GUAROCC @  Subjective:  Flare   History of Present Illness: Meghan Alvarez is a 87 y.o. female with history of myofascial pain, osteoarthritis, DDD.  She is taking cymbalta 60 mg 1 capsule by mouth daily and remains on gabapentin 100 mg at bedtime.  She presents today with trapezius muscle tension and tenderness bilaterally.  Has been experiencing muscle spasms intermittently.  She takes methocarbamol 500 mg 1 tablet daily as needed for muscle spasms.  She states that she was scrubbing a rug prior to [facility] Day weekend which exacerbated her symptoms.  Her symptoms have been persistent and severe.  She has been less active due to the severity of her symptoms.  She states in the past her symptoms have been responsive to prednisone.  She states that she has trigger point injections every 6 months.  Her last injections were administered on 02/06/2021.   Activities of Daily Living:  Patient reports morning stiffness for 15 minutes.   Patient Reports nocturnal pain.  Difficulty dressing/grooming: Reports Difficulty climbing stairs: Denies Difficulty getting out of chair: Denies Difficulty using hands for taps, buttons, cutlery, and/or writing: Denies  Review of Systems  Constitutional:  Negative for fatigue.  HENT:  Negative for mouth dryness.   Eyes:  Positive for dryness.  Respiratory:  Negative for shortness of breath.   Cardiovascular:  Positive for swelling in legs/feet.  Gastrointestinal:  Negative for constipation.  Endocrine: Negative for increased urination.  Genitourinary:  Negative for difficulty urinating.  Musculoskeletal:  Positive for joint pain, joint pain, joint swelling, muscle weakness and morning stiffness.  Skin:  Negative for rash.  Allergic/Immunologic: Negative  for susceptible to infections.  Neurological:  Positive for weakness.  Hematological:  Negative for bruising/bleeding tendency.  Psychiatric/Behavioral:  Positive for sleep disturbance.    PMFS History:  Patient Active Problem List   Diagnosis Date Noted   Stage 3a chronic kidney disease (HCC) 07/11/2020   Chest pain 01/19/2020   Diabetes mellitus with coincident hypertension (HCC) 01/19/2020   Other hyperlipidemia 01/19/2020   Myofascial pain syndrome 02/04/2016   Spondylosis of lumbar region without myelopathy or radiculopathy 02/04/2016   DJD (degenerative joint disease), cervical 02/04/2016   Age-related osteoporosis without current pathological fracture 02/04/2016   Vitamin D deficiency 02/04/2016   S/P TKR (total knee replacement) using cement 03/30/2014    Past Medical History:  Diagnosis Date   Arthritis    Complication of anesthesia    Diabetes mellitus without complication (HCC)    Fibromyalgia    Hyperlipemia    PONV (postoperative nausea and vomiting)     Family History  Problem Relation Age of Onset   Cancer Sister        breast   Diabetes Brother    Past Surgical History:  Procedure Laterality Date   APPENDECTOMY  01/12/2002   ruptured    CARPAL TUNNEL RELEASE Right 05/03/2020   Procedure: RIGHT OPEN CARPAL TUNNEL RELEASE, RIGHT THUMB METACARPOPHALANGEAL JOINT INJECTION;  Surgeon: Kerrin Champagne, MD;  Location: [facility];  Service: Orthopedics;  Laterality: Right;   CATARACT EXTRACTION, BILATERAL  2022   CERVICAL DISCECTOMY  01/12/1989  DILATION AND CURETTAGE OF UTERUS  01/13/2003   FRACTURE SURGERY Left 01/13/2004   arm   JOINT REPLACEMENT Left 01/12/2006   knee repl   SHOULDER ARTHROSCOPY Right 01/13/1999   SHOULDER ARTHROSCOPY Left 01/12/2005   TONSILLECTOMY     TOTAL KNEE ARTHROPLASTY Right 03/30/2014   DR NORRIS   TOTAL KNEE ARTHROPLASTY Right 03/30/2014   Procedure: RIGHT TOTAL KNEE ARTHROPLASTY;  Surgeon: Netta Cedars, MD;   Location: [place];  Service: Orthopedics;  Laterality: Right;   Social History   Social History Narrative   Not on file   Immunization History  Administered Date(s) Administered   Influenza, High Dose Seasonal PF 10/16/2018   Moderna Sars-Covid-2 Vaccination 01/23/2019, 02/20/2019, 11/07/2019   Zoster Recombinat (Shingrix) 11/24/2018     Objective: Vital Signs: BP 135/77 (BP Location: Right Arm, Patient Position: Sitting, Cuff Size: Large)   Pulse 78   Resp 16   Ht 5' 5.5" (1.664 m)   Wt 191 lb (86.6 kg)   BMI 31.30 kg/m    Physical Exam Vitals and nursing note reviewed.  Constitutional:      Appearance: She is well-developed.  HENT:     Head: Normocephalic and atraumatic.  Eyes:     Conjunctiva/sclera: Conjunctivae normal.  Cardiovascular:     Rate and Rhythm: Normal rate and regular rhythm.     Heart sounds: Normal heart sounds.  Pulmonary:     Effort: Pulmonary effort is normal.     Breath sounds: Normal breath sounds.  Abdominal:     General: Bowel sounds are normal.     Palpations: Abdomen is soft.  Musculoskeletal:     Cervical back: Normal range of motion.  Skin:    General: Skin is warm and dry.     Capillary Refill: Capillary refill takes less than 2 seconds.  Neurological:     Mental Status: She is alert and oriented to person, place, and time.  Psychiatric:        Behavior: Behavior normal.     Musculoskeletal Exam: C-spine has limited range of motion.  Trapezius muscle tension and tenderness bilaterally.  Midline spinal tenderness in the thoracic region.  Shoulder joints have painful range of motion and limited abduction to about 120 degrees.  Elbow joints, wrist joints, MCPs, PIPs, DIPs have good range of motion with no synovitis.  PIP and DIP thickening consistent with osteoarthritis of both hands noted.  Complete fist formation bilaterally.  Hip joints difficult to assess in seated position.  Bilateral knee replacements have good range of motion with no  warmth or effusion.  Ankle joints have good range of motion with no joint tenderness or synovitis.  CDAI Exam: CDAI Score: -- Patient Global: --; Provider Global: -- Swollen: --; Tender: -- Joint Exam 06/16/2021   No joint exam has been documented for this visit   There is currently no information documented on the homunculus. Go to the Rheumatology activity and complete the homunculus joint exam.  Investigation: No additional findings.  Imaging: No results found.  Recent Labs: Lab Results  Component Value Date   WBC 5.0 05/02/2020   HGB 13.0 05/02/2020   PLT 199 05/02/2020   NA 138 05/02/2020   K 4.5 05/02/2020   CL 106 05/02/2020   CO2 26 05/02/2020   GLUCOSE 93 05/02/2020   BUN 9 05/02/2020   CREATININE 0.90 05/02/2020   BILITOT 0.5 08/17/2017   ALKPHOS 62 08/17/2017   AST 33 08/17/2017   ALT 33 08/17/2017   PROT 7.7  08/17/2017   ALBUMIN 4.5 08/17/2017   CALCIUM 9.8 05/02/2020   GFRAA >60 08/17/2017    Speciality Comments: No specialty comments available.  Procedures:  Trigger Point Inj  Date/Time: 06/16/2021 2:55 PM Performed by: Ofilia Neas, PA-C Authorized by: Ofilia Neas, PA-C   Consent Given by:  Patient Site marked: the procedure site was marked   Timeout: prior to procedure the correct patient, procedure, and site was verified   Indications:  Pain Total # of Trigger Points:  2 Location: neck   Needle Size:  27 G Approach:  Dorsal Medications #1:  0.5 mL lidocaine 1 %; 10 mg triamcinolone acetonide 40 MG/ML Medications #2:  0.5 mL lidocaine 1 %; 10 mg triamcinolone acetonide 40 MG/ML Patient tolerance:  Patient tolerated the procedure well with no immediate complications Allergies: Azithromycin and Codeine   Assessment / Plan:     Visit Diagnoses: Myofascial pain syndrome - She presents today experiencing a flare of myofascial pain.  She has trapezius muscle tension and tenderness bilaterally and has been experiencing muscle spasms  intermittently.  She described a rug the day before memorial weekend and has had persistent discomfort since then.  She has been taking methocarbamol 500 mg 1 tablet daily as needed for muscle spasms and remains on Cymbalta 60 mg 1 capsule daily and gabapentin 100 mg at bedtime.  She requested by trigger point injections today.  She tolerated the procedure well.  Procedure notes were completed above.  Aftercare was discussed.  Discussed the use of topical agents including Biofreeze or Salonpas patches. Discussed the importance of regular exercise and good sleep hygiene.  I also discussed the importance of avoiding overuse activities as well as stress management.  Trapezius muscle spasm: She presents today with trapezius muscle tension and tenderness bilaterally.  Different treatment options were discussed today in detail.  The patient requested to have an oral prednisone taper sent to the pharmacy but I discussed the risks of systemic prednisone use.  Bilateral trigger point injections were performed today instead.  She was advised to monitor blood pressure closely following the cortisone injections today.  She will notify us if her symptoms persist or worsen.  She can continue to take methocarbamol 500 mg 1 tablet daily as needed for muscle spasms.  S/P carpal tunnel release - April 2022 by Dr. Louanne Skye.   Primary osteoarthritis of both hands: She has PIP and DIP thickening consistent with osteoarthritis of both hands.  No tenderness or inflammation was noted on examination today.  DDD (degenerative disc disease), cervical: C-spine has limited range of motion especially with lateral rotation.  She is experiencing trapezius muscle tension and tenderness bilaterally.  Bilateral trigger point injections were performed today.  DDD (degenerative disc disease), lumbar - Followed by Dr. Louanne Skye.  Status post total bilateral knee replacement using cement: Doing well.  She has good range of motion with no  discomfort.  No warmth or effusion noted.  Age-related osteoporosis without current pathological fracture - DEXA was ordered by PCP.  She is taking vitamin D supplement 1000 units daily.  History of vitamin D deficiency: She is taking vitamin D 1000 units daily.  Other medical conditions are listed as follows:  Essential hypertension  Other specified hearing loss of both ears  Orders: Orders Placed This Encounter  Procedures   Trigger Point Inj   No orders of the defined types were placed in this encounter.    Follow-Up Instructions: Return in about 6 months (around 12/16/2021) for Myofascial  pain, Osteoarthritis, DDD.   Ofilia Neas, PA-C  Note - This record has been created using Dragon software.  Chart creation errors have been sought, but may not always  have been located. Such creation errors do not reflect on  the standard of medical care.

## 2021-06-16 ENCOUNTER — Encounter: Payer: Self-pay | Admitting: Physician Assistant

## 2021-06-16 ENCOUNTER — Ambulatory Visit: Payer: Medicare Other | Admitting: Physician Assistant

## 2021-06-16 VITALS — BP 135/77 | HR 78 | Resp 16 | Ht 65.5 in | Wt 191.0 lb

## 2021-06-16 DIAGNOSIS — Z8639 Personal history of other endocrine, nutritional and metabolic disease: Secondary | ICD-10-CM

## 2021-06-16 DIAGNOSIS — M7918 Myalgia, other site: Secondary | ICD-10-CM | POA: Diagnosis not present

## 2021-06-16 DIAGNOSIS — M5030 Other cervical disc degeneration, unspecified cervical region: Secondary | ICD-10-CM

## 2021-06-16 DIAGNOSIS — M19041 Primary osteoarthritis, right hand: Secondary | ICD-10-CM

## 2021-06-16 DIAGNOSIS — M810 Age-related osteoporosis without current pathological fracture: Secondary | ICD-10-CM

## 2021-06-16 DIAGNOSIS — H918X3 Other specified hearing loss, bilateral: Secondary | ICD-10-CM

## 2021-06-16 DIAGNOSIS — M19042 Primary osteoarthritis, left hand: Secondary | ICD-10-CM

## 2021-06-16 DIAGNOSIS — Z96653 Presence of artificial knee joint, bilateral: Secondary | ICD-10-CM

## 2021-06-16 DIAGNOSIS — M62838 Other muscle spasm: Secondary | ICD-10-CM | POA: Diagnosis not present

## 2021-06-16 DIAGNOSIS — Z9889 Other specified postprocedural states: Secondary | ICD-10-CM

## 2021-06-16 DIAGNOSIS — M51369 Other intervertebral disc degeneration, lumbar region without mention of lumbar back pain or lower extremity pain: Secondary | ICD-10-CM

## 2021-06-16 DIAGNOSIS — M5136 Other intervertebral disc degeneration, lumbar region: Secondary | ICD-10-CM

## 2021-06-16 DIAGNOSIS — I10 Essential (primary) hypertension: Secondary | ICD-10-CM

## 2021-06-16 MED ORDER — LIDOCAINE HCL 1 % IJ SOLN
0.5000 mL | INTRAMUSCULAR | Status: AC | PRN
  Administered 2021-06-16: .5 mL

## 2021-06-16 MED ORDER — TRIAMCINOLONE ACETONIDE 40 MG/ML IJ SUSP
10.0000 mg | INTRAMUSCULAR | Status: AC | PRN
  Administered 2021-06-16: 10 mg via INTRAMUSCULAR

## 2021-08-06 ENCOUNTER — Ambulatory Visit: Payer: Medicare Other | Admitting: Rheumatology

## 2021-09-02 ENCOUNTER — Telehealth: Payer: Self-pay | Admitting: *Deleted

## 2021-09-02 NOTE — Telephone Encounter (Addendum)
Received a potential drug to age interaction from [employer].   Possible interaction between: Methocarbamol and >= 67.  Possible Risks: Muscle relaxants such as Methocarbamol are poorly tolerated due to anticholinergic effects, risk of sedation, increased risk of fracture and have questionable efficacy in patients 85 years of age or older. If possible, please avoid use in this population.   Reviewed by: Sherron Ales, PA-C  Recommendations: Notify patient of the warning. Advise patient to try reducing dose or taking PRN.  Notified patient of possible interactions between  Methocarbamol and her age being over 16. Advised risk include:  Muscle relaxants such as Methocarbamol are poorly tolerated due to anticholinergic effects, risk of sedation, increased risk of fracture and have questionable efficacy in patients 53 years of age or older.. Patient advised of the following recommendations: Patient advised to try reducing dose or taking PRN. Patient expressed understanding and states she has a follow up appointment in a couple of weeks and will discuss then.

## 2021-09-03 NOTE — Progress Notes (Signed)
Office Visit Note  Patient: Meghan Alvarez             Date of Birth: [birth year]           MRN: [id number]             PCP: Pcp, No Referring: No ref. provider found Visit Date: 09/17/2021 Occupation: @GUAROCC @  Subjective:  Trapezius muscle tension and tenderness    History of Present Illness: Meghan Alvarez is a 87 y.o. female with history of myofascial pain, osteoarthritis, and DDD.  Patient presents today with trapezius muscle tension tenderness bilaterally.  She states she woke up this morning with increased myalgias and muscle tenderness.  She is also been experiencing muscle spasms in the paraspinal muscles of the thoracic region.  She states yesterday she was leaning down to fix the cap of her pants and has had increased discomfort since then.  She requested bilateral trapezius trigger point injections today.  She had trigger point injections on 06/16/2021 which provided significant relief but her symptoms have returned.  She is continues taking methocarbamol 500 mg 1 tablet daily as needed for muscle spasms, Cymbalta 60 mg 1 capsule daily and gabapentin 100 mg at bedtime.  Activities of Daily Living:  Patient reports morning stiffness for 15 minutes.   Patient Reports nocturnal pain.  Difficulty dressing/grooming: Denies Difficulty climbing stairs: Denies Difficulty getting out of chair: Denies Difficulty using hands for taps, buttons, cutlery, and/or writing: Denies  Review of Systems  Constitutional:  Positive for fatigue.  HENT:  Positive for hearing loss. Negative for mouth sores and mouth dryness.   Eyes:  Negative for dryness.  Respiratory:  Negative for shortness of breath.   Cardiovascular:  Negative for chest pain and palpitations.  Gastrointestinal:  Negative for blood in stool, constipation and diarrhea.  Endocrine: Negative for increased urination.  Genitourinary:  Negative for involuntary urination.  Musculoskeletal:  Positive for joint pain, joint pain,  joint swelling, myalgias, morning stiffness, muscle tenderness and myalgias. Negative for gait problem and muscle weakness.  Skin:  Negative for color change, rash, hair loss and sensitivity to sunlight.  Allergic/Immunologic: Negative for susceptible to infections.  Neurological:  Negative for dizziness and headaches.  Hematological:  Negative for swollen glands.  Psychiatric/Behavioral:  Positive for depressed mood. Negative for sleep disturbance. The patient is nervous/anxious.     PMFS History:  Patient Active Problem List   Diagnosis Date Noted   Stage 3a chronic kidney disease (HCC) 07/11/2020   Chest pain 01/19/2020   Diabetes mellitus with coincident hypertension (HCC) 01/19/2020   Other hyperlipidemia 01/19/2020   Myofascial pain syndrome 02/04/2016   Spondylosis of lumbar region without myelopathy or radiculopathy 02/04/2016   DJD (degenerative joint disease), cervical 02/04/2016   Age-related osteoporosis without current pathological fracture 02/04/2016   Vitamin D deficiency 02/04/2016   S/P TKR (total knee replacement) using cement 03/30/2014    Past Medical History:  Diagnosis Date   Arthritis    Complication of anesthesia    Diabetes mellitus without complication (HCC)    Fibromyalgia    Hyperlipemia    PONV (postoperative nausea and vomiting)     Family History  Problem Relation Age of Onset   Cancer Sister        breast   Diabetes Brother    Past Surgical History:  Procedure Laterality Date   APPENDECTOMY  01/12/2002   ruptured    CARPAL TUNNEL RELEASE Right 05/03/2020   Procedure: RIGHT OPEN CARPAL TUNNEL RELEASE,  RIGHT THUMB METACARPOPHALANGEAL JOINT INJECTION;  Surgeon: Jessy Oto, MD;  Location: [place];  Service: Orthopedics;  Laterality: Right;   CATARACT EXTRACTION, BILATERAL  2022   CERVICAL DISCECTOMY  01/12/1989   DILATION AND CURETTAGE OF UTERUS  01/13/2003   FRACTURE SURGERY Left 01/13/2004   arm   JOINT REPLACEMENT  Left 01/12/2006   knee repl   SHOULDER ARTHROSCOPY Right 01/13/1999   SHOULDER ARTHROSCOPY Left 01/12/2005   TONSILLECTOMY     TOTAL KNEE ARTHROPLASTY Right 03/30/2014   DR NORRIS   TOTAL KNEE ARTHROPLASTY Right 03/30/2014   Procedure: RIGHT TOTAL KNEE ARTHROPLASTY;  Surgeon: Netta Cedars, MD;  Location: [place];  Service: Orthopedics;  Laterality: Right;   Social History   Social History Narrative   Not on file   Immunization History  Administered Date(s) Administered   Influenza, High Dose Seasonal PF 10/16/2018   Moderna Sars-Covid-2 Vaccination 01/23/2019, 02/20/2019, 11/07/2019   Zoster Recombinat (Shingrix) 11/24/2018     Objective: Vital Signs: BP (!) 143/77 (BP Location: Left Arm, Patient Position: Sitting, Cuff Size: Normal)   Pulse 96   Resp 15   Ht 5' 5.5" (1.664 m)   Wt 193 lb 6.4 oz (87.7 kg)   BMI 31.69 kg/m    Physical Exam Vitals and nursing note reviewed.  Constitutional:      Appearance: She is well-developed.  HENT:     Head: Normocephalic and atraumatic.  Eyes:     Conjunctiva/sclera: Conjunctivae normal.  Cardiovascular:     Rate and Rhythm: Normal rate and regular rhythm.     Heart sounds: Normal heart sounds.  Pulmonary:     Effort: Pulmonary effort is normal.     Breath sounds: Normal breath sounds.  Abdominal:     General: Bowel sounds are normal.     Palpations: Abdomen is soft.  Musculoskeletal:     Cervical back: Normal range of motion.  Skin:    General: Skin is warm and dry.     Capillary Refill: Capillary refill takes less than 2 seconds.  Neurological:     Mental Status: She is alert and oriented to person, place, and time.  Psychiatric:        Behavior: Behavior normal.      Musculoskeletal Exam: C-spine has good ROM.  Trapezius muscle tension and tenderness bilaterally.  Paraspinal muscle tenderness in the thoracic region.  Shoulder joints, elbow joints, MCPs, PIPs, and DIPs good ROM with no synovitis.  Complete fist  formation.  PIP and DIP thickening consistent with osteoarthritis of both hands.  Hip joints have good range of motion with no groin pain.  Tenderness over bilateral trochanteric bursa.  Knee joints have good range of motion with no warmth or effusion.  Ankle joints have good range of motion with no tenderness or joint swelling.  No tenderness over MTP joints.  CDAI Exam: CDAI Score: -- Patient Global: --; Provider Global: -- Swollen: --; Tender: -- Joint Exam 09/17/2021   No joint exam has been documented for this visit   There is currently no information documented on the homunculus. Go to the Rheumatology activity and complete the homunculus joint exam.  Investigation: No additional findings.  Imaging: No results found.  Recent Labs: Lab Results  Component Value Date   WBC 5.0 05/02/2020   HGB 13.0 05/02/2020   PLT 199 05/02/2020   NA 138 05/02/2020   K 4.5 05/02/2020   CL 106 05/02/2020   CO2 26 05/02/2020   GLUCOSE 93  05/02/2020   BUN 9 05/02/2020   CREATININE 0.90 05/02/2020   BILITOT 0.5 08/17/2017   ALKPHOS 62 08/17/2017   AST 33 08/17/2017   ALT 33 08/17/2017   PROT 7.7 08/17/2017   ALBUMIN 4.5 08/17/2017   CALCIUM 9.8 05/02/2020   GFRAA >60 08/17/2017    Speciality Comments: No specialty comments available.  Procedures:  Trigger Point Inj  Date/Time: 09/17/2021 2:40 PM  Performed by: Gearldine Bienenstock, PA-C Authorized by: Gearldine Bienenstock, PA-C   Consent Given by:  Patient Site marked: the procedure site was marked   Timeout: prior to procedure the correct patient, procedure, and site was verified   Indications:  Pain Total # of Trigger Points:  2 Location: neck   Needle Size:  27 G Approach:  Dorsal Medications #1:  0.5 mL lidocaine 1 %; 10 mg triamcinolone acetonide 40 MG/ML Medications #2:  0.5 mL lidocaine 1 %; 10 mg triamcinolone acetonide 40 MG/ML Patient tolerance:  Patient tolerated the procedure well with no immediate complications  Allergies:  Azithromycin and Codeine   Trigger point injections on 06/16/21  Assessment / Plan:     Visit Diagnoses: Myofascial pain syndrome -Patient presents today with increased myalgias and muscle tenderness due to myofascial pain syndrome.  She is having trapezius muscle tension and tenderness bilaterally.  This morning she has started to have increased muscle spasms in the trapezius and thoracic region.  She has some tenderness over the paraspinal muscles in the thoracic region.  She continues to take methocarbamol 500 mg 1 tablet daily as needed for muscle spasms, Cymbalta 60 mg 1 capsule daily, and gabapentin 100 mg at bedtime.  She has been going to water aerobics 3 days a week. Discussed the importance of regular exercise and good sleep hygiene.  She will follow-up in the office in 3 months or sooner if needed.  Trapezius muscle spasm - She presents today with trapezius muscle tension and tenderness bilaterally.  She has been experiencing increased muscle spasms today.  She takes methocarbamol 500 mg 1 tablet daily as needed for muscle spasms.  She requested trigger point injections today.  She tolerated procedures well.  Procedure notes were completed above.  Aftercare was discussed.  She was advised to notify us if her symptoms persist or worsen.  Plan: Trigger Point Inj  S/P carpal tunnel release - April 2022 by Dr. Otelia Sergeant.  Asymptomatic at this time.  Primary osteoarthritis of both hands: She has PIP and DIP thickening consistent with osteoarthritis of both hands.  Discussed the importance of joint protection and muscle strengthening.  DDD (degenerative disc disease), cervical: C-spine has good range of motion.  She has trapezius muscle tension and tenderness bilaterally.  DDD (degenerative disc disease), lumbar - Followed by Dr. Otelia Sergeant.  No midline spinal tenderness.  No symptoms of radiculopathy at this time.  Status post total bilateral knee replacement using cement: Doing well.  Good range of  motion of both knee replacements with no discomfort.  No warmth or effusion was noted.  Age-related osteoporosis without current pathological fracture - DEXA was ordered by PCP.  She is taking vitamin D 1000 units daily.  History of vitamin D deficiency: She is taking vitamin D 1000 units daily.  Essential hypertension: Blood pressure was 143/77 today in the office.  Other specified hearing loss of both ears  Orders: Orders Placed This Encounter  Procedures   Trigger Point Inj   No orders of the defined types were placed in this encounter.  Follow-Up Instructions: Return in about 3 months (around 12/17/2021) for Myofascial pain, Osteoarthritis, DDD.   Ofilia Neas, PA-C  Note - This record has been created using Dragon software.  Chart creation errors have been sought, but may not always  have been located. Such creation errors do not reflect on  the standard of medical care.

## 2021-09-17 ENCOUNTER — Encounter: Payer: Self-pay | Admitting: Physician Assistant

## 2021-09-17 ENCOUNTER — Ambulatory Visit: Payer: Medicare Other | Attending: Physician Assistant | Admitting: Physician Assistant

## 2021-09-17 VITALS — BP 143/77 | HR 96 | Resp 15 | Ht 65.5 in | Wt 193.4 lb

## 2021-09-17 DIAGNOSIS — M19042 Primary osteoarthritis, left hand: Secondary | ICD-10-CM

## 2021-09-17 DIAGNOSIS — Z96653 Presence of artificial knee joint, bilateral: Secondary | ICD-10-CM

## 2021-09-17 DIAGNOSIS — Z9889 Other specified postprocedural states: Secondary | ICD-10-CM

## 2021-09-17 DIAGNOSIS — M810 Age-related osteoporosis without current pathological fracture: Secondary | ICD-10-CM

## 2021-09-17 DIAGNOSIS — M19041 Primary osteoarthritis, right hand: Secondary | ICD-10-CM

## 2021-09-17 DIAGNOSIS — M62838 Other muscle spasm: Secondary | ICD-10-CM

## 2021-09-17 DIAGNOSIS — H918X3 Other specified hearing loss, bilateral: Secondary | ICD-10-CM

## 2021-09-17 DIAGNOSIS — Z8639 Personal history of other endocrine, nutritional and metabolic disease: Secondary | ICD-10-CM

## 2021-09-17 DIAGNOSIS — M5136 Other intervertebral disc degeneration, lumbar region: Secondary | ICD-10-CM

## 2021-09-17 DIAGNOSIS — M7918 Myalgia, other site: Secondary | ICD-10-CM

## 2021-09-17 DIAGNOSIS — I10 Essential (primary) hypertension: Secondary | ICD-10-CM

## 2021-09-17 DIAGNOSIS — M5030 Other cervical disc degeneration, unspecified cervical region: Secondary | ICD-10-CM

## 2021-09-17 MED ORDER — LIDOCAINE HCL 1 % IJ SOLN
0.5000 mL | INTRAMUSCULAR | Status: AC | PRN
  Administered 2021-09-17: .5 mL

## 2021-09-17 MED ORDER — TRIAMCINOLONE ACETONIDE 40 MG/ML IJ SUSP
10.0000 mg | INTRAMUSCULAR | Status: AC | PRN
  Administered 2021-09-17: 10 mg via INTRAMUSCULAR

## 2021-10-07 ENCOUNTER — Other Ambulatory Visit: Payer: Self-pay | Admitting: Physician Assistant

## 2021-10-08 NOTE — Telephone Encounter (Signed)
Next Visit: 12/24/2021  Last Visit: 09/17/2021  Last Fill: 05/27/2021  Dx: Trapezius muscle spasm   Current Dose per office note on 09/17/2021: methocarbamol 500 mg 1 tablet daily as needed   Okay to refill Methocarbamol?

## 2021-10-30 ENCOUNTER — Other Ambulatory Visit: Payer: Self-pay | Admitting: Family Medicine

## 2021-10-30 DIAGNOSIS — N939 Abnormal uterine and vaginal bleeding, unspecified: Secondary | ICD-10-CM

## 2021-11-06 ENCOUNTER — Ambulatory Visit
Admission: RE | Admit: 2021-11-06 | Discharge: 2021-11-06 | Disposition: A | Discharge status: Home / Self Care | Payer: Medicare Other | Source: Ambulatory Visit | Attending: Family Medicine | Admitting: Family Medicine

## 2021-11-06 ENCOUNTER — Ambulatory Visit: Payer: Medicare Other | Attending: Internal Medicine | Admitting: Internal Medicine

## 2021-11-06 ENCOUNTER — Encounter: Payer: Self-pay | Admitting: Internal Medicine

## 2021-11-06 VITALS — BP 130/70 | HR 84 | Ht 65.0 in | Wt 192.0 lb

## 2021-11-06 DIAGNOSIS — E7849 Other hyperlipidemia: Secondary | ICD-10-CM

## 2021-11-06 DIAGNOSIS — N1831 Chronic kidney disease, stage 3a: Secondary | ICD-10-CM | POA: Diagnosis not present

## 2021-11-06 DIAGNOSIS — I10 Essential (primary) hypertension: Secondary | ICD-10-CM

## 2021-11-06 DIAGNOSIS — E119 Type 2 diabetes mellitus without complications: Secondary | ICD-10-CM | POA: Diagnosis not present

## 2021-11-06 DIAGNOSIS — N939 Abnormal uterine and vaginal bleeding, unspecified: Secondary | ICD-10-CM

## 2021-11-06 NOTE — Progress Notes (Signed)
Cardiology Office Note:    Date:  11/06/2021   ID:  Meghan Alvarez, DOB 03-Sep-1934, MRN [id number]  PCP:  Pcp, No  [facility] Cardiologist:  Werner Lean, MD  [facility] Electrophysiologist:  None   CC: HTN and HLD f/u  History of Present Illness:    Meghan Alvarez is a 87 y.o. female with a hx DM with HTN, HLD RA who presents for evaluation 01/19/20.  In interim of this visit, patient had negative stress test.  Seen 07/11/20 with resolved CP.   Patient notes that she is doing well.   Since last visit notes is trying to lose weight. Husband is dealing with dementia and it is getting difficult (has vascular dementia).  There are no interval hospital/ED visit.    No chest pain or pressure .  No SOB/DOE and no PND/Orthopnea.  No weight gain or leg swelling.  No palpitations or syncope.  Ambulatory blood pressure not done.   Past Medical History:  Diagnosis Date   Arthritis    Complication of anesthesia    Diabetes mellitus without complication ([place])    Fibromyalgia    Hyperlipemia    PONV (postoperative nausea and vomiting)     Past Surgical History:  Procedure Laterality Date   APPENDECTOMY  01/12/2002   ruptured    CARPAL TUNNEL RELEASE Right 05/03/2020   Procedure: RIGHT OPEN CARPAL TUNNEL RELEASE, RIGHT THUMB METACARPOPHALANGEAL JOINT INJECTION;  Surgeon: Jessy Oto, MD;  Location: [place];  Service: Orthopedics;  Laterality: Right;   CATARACT EXTRACTION, BILATERAL  2022   CERVICAL DISCECTOMY  01/12/1989   DILATION AND CURETTAGE OF UTERUS  01/13/2003   FRACTURE SURGERY Left 01/13/2004   arm   JOINT REPLACEMENT Left 01/12/2006   knee repl   SHOULDER ARTHROSCOPY Right 01/13/1999   SHOULDER ARTHROSCOPY Left 01/12/2005   TONSILLECTOMY     TOTAL KNEE ARTHROPLASTY Right 03/30/2014   DR NORRIS   TOTAL KNEE ARTHROPLASTY Right 03/30/2014   Procedure: RIGHT TOTAL KNEE ARTHROPLASTY;  Surgeon: Netta Cedars, MD;  Location: [place];   Service: Orthopedics;  Laterality: Right;    Current Medications: Current Meds  Medication Sig   cholecalciferol (VITAMIN D) 1000 units tablet Take 1,000 Units by mouth daily.   diclofenac Sodium (VOLTAREN) 1 % GEL Apply 2 g topically 4 (four) times daily. (Patient taking differently: Apply 2 g topically as needed (pain).)   DULoxetine (CYMBALTA) 60 MG capsule Take 60 mg by mouth daily.   gabapentin (NEURONTIN) 100 MG capsule TAKE ONE CAPSULE BY MOUTH EVERYDAY AT BEDTIME   methocarbamol (ROBAXIN) 500 MG tablet TAKE ONE TABLET BY MOUTH DAILY AS NEEDED FOR MUSCLE SPASMS   Multiple Vitamin (MULTIVITAMIN WITH MINERALS) TABS tablet Take 1 tablet by mouth daily. Centrum   Omega-3 Fatty Acids (FISH OIL) 1000 MG CAPS Take 1,000 mg by mouth at bedtime.   promethazine-dextromethorphan (PROMETHAZINE-DM) 6.25-15 MG/5ML syrup as needed.   rosuvastatin (CRESTOR) 5 MG tablet Take by mouth daily.    traMADol (ULTRAM) 50 MG tablet Take 50 mg by mouth 2 (two) times daily as needed.   vitamin B-12 (CYANOCOBALAMIN) 1000 MCG tablet Take 1,000 mcg by mouth daily.     Allergies:   Azithromycin and Codeine   Social History   Socioeconomic History   Marital status: Married    Spouse name: Not on file   Number of children: Not on file   Years of education: Not on file   Highest education level: Not on  file  Occupational History   Not on file  Tobacco Use   Smoking status: Never    Passive exposure: Never   Smokeless tobacco: Never  Vaping Use   Vaping Use: Never used  Substance and Sexual Activity   Alcohol use: Yes    Comment: RARELY   Drug use: Never   Sexual activity: Not on file  Other Topics Concern   Not on file  Social History Narrative   Not on file   Social Determinants of Health   Financial Resource Strain: Not on file  Food Insecurity: Not on file  Transportation Needs: Not on file  Physical Activity: Not on file  Stress: Not on file  Social Connections: Not on file     Social: Loves to Bowl and bowled since 2019, husband has vascular dementia; has three daugthers  Family History: The patient's family history includes Cancer in her sister; Diabetes in her brother. History of coronary artery disease notable for no members. History of heart failure notable for no members. History of arrhythmia notable for no members. No history of bicuspid aortic valve or aortic aneurysm or dissection.  ROS:   Please see the history of present illness.     All other systems reviewed and are negative.  EKGs/Labs/Other Studies Reviewed:    The following studies were reviewed today:  EKG:   11/06/21: SR rate 84 01/19/2020: SR rate 70, WNL  08/19/2017:  SR rate 89, nonspecific TWI  NM Stress Testing : Date: 02/02/2020 Results: The left ventricular ejection fraction is hyperdynamic (>65%). Nuclear stress EF: 72%. No wall motion abnormalities. There was no ST segment deviation noted during stress. The study is normal. There is no evidence of ischemia or infarction. This is a low risk study.    Recent Labs: No results found for requested labs within last 365 days.  Recent Lipid Panel No results found for: "CHOL", "TRIG", "HDL", "CHOLHDL", "VLDL", "LDLCALC", "LDLDIRECT"  Physical Exam:    VS:  BP 130/70   Pulse 84   Ht 5\' 5"  (1.651 m)   Wt 192 lb (87.1 kg)   SpO2 95%   BMI 31.95 kg/m     Wt Readings from Last 3 Encounters:  11/06/21 192 lb (87.1 kg)  09/17/21 193 lb 6.4 oz (87.7 kg)  06/16/21 191 lb (86.6 kg)    GEN:  Well nourished, well developed in no acute distress HEENT: Normal NECK: No JVD LYMPHATICS: No lymphadenopathy CARDIAC: RRR, no murmurs, rubs, gallops RESPIRATORY:  Clear to auscultation without rales, wheezing or rhonchi  ABDOMEN: Soft, non-tender, non-distended MUSCULOSKELETAL:  No edema; No deformity  SKIN: Warm and dry NEUROLOGIC:  Alert and oriented x 3 PSYCHIATRIC:  Normal affect   ASSESSMENT:    1. Diabetes mellitus with  coincident hypertension ([place])   2. Other hyperlipidemia   3. Stage 3a chronic kidney disease (HCC)     PLAN:    Hyperlipidemia Diabetes with hypertension CKD Stage IIIa - well controlled on current therapy - repeat BP was normal  One year    Medication Adjustments/Labs and Tests Ordered: Current medicines are reviewed at length with the patient today.  Concerns regarding medicines are outlined above.  Orders Placed This Encounter  Procedures   EKG 12-Lead    No orders of the defined types were placed in this encounter.    Patient Instructions  Medication Instructions:  Your physician recommends that you continue on your current medications as directed. Please refer to the Current Medication list given to  you today.  *If you need a refill on your cardiac medications before your next appointment, please call your pharmacy*   Lab Work: NONE If you have labs (blood work) drawn today and your tests are completely normal, you will receive your results only by: [place] (if you have MyChart) OR A paper copy in the mail If you have any lab test that is abnormal or we need to change your treatment, we will call you to review the results.   Testing/Procedures: NONE   Follow-Up: At [facility], you and your health needs are our priority.  As part of our continuing mission to provide you with exceptional heart care, we have created designated Provider Care Teams.  These Care Teams include your primary Cardiologist (physician) and Advanced Practice Providers (APPs -  Physician Assistants and Nurse Practitioners) who all work together to provide you with the care you need, when you need it.  We recommend signing up for the patient portal called "MyChart".  Sign up information is provided on this After Visit Summary.  MyChart is used to connect with patients for Virtual Visits (Telemedicine).  Patients are able to view lab/test results, encounter notes, upcoming  appointments, etc.  Non-urgent messages can be sent to your provider as well.   To learn more about what you can do with MyChart, go to [email or web address].    Your next appointment:   1 year(s)  The format for your next appointment:   In Person  Provider:   Werner Lean, MD     Important Information About Sugar         Signed, Werner Lean, MD  11/06/2021 4:45 PM    [place]

## 2021-11-06 NOTE — Patient Instructions (Signed)
Medication Instructions:  Your physician recommends that you continue on your current medications as directed. Please refer to the Current Medication list given to you today.  *If you need a refill on your cardiac medications before your next appointment, please call your pharmacy*   Lab Work: NONE If you have labs (blood work) drawn today and your tests are completely normal, you will receive your results only by: MyChart Message (if you have MyChart) OR A paper copy in the mail If you have any lab test that is abnormal or we need to change your treatment, we will call you to review the results.   Testing/Procedures: NONE   Follow-Up: At [facility], you and your health needs are our priority.  As part of our continuing mission to provide you with exceptional heart care, we have created designated Provider Care Teams.  These Care Teams include your primary Cardiologist (physician) and Advanced Practice Providers (APPs -  Physician Assistants and Nurse Practitioners) who all work together to provide you with the care you need, when you need it.  We recommend signing up for the patient portal called "MyChart".  Sign up information is provided on this After Visit Summary.  MyChart is used to connect with patients for Virtual Visits (Telemedicine).  Patients are able to view lab/test results, encounter notes, upcoming appointments, etc.  Non-urgent messages can be sent to your provider as well.   To learn more about what you can do with MyChart, go to [email or web address].    Your next appointment:   1 year(s)  The format for your next appointment:   In Person  Provider:   Mahesh A Chandrasekhar, MD     Important Information About Sugar       

## 2021-12-11 NOTE — Progress Notes (Signed)
Office Visit Note  Patient: Meghan Alvarez             Date of Birth: [birth year]           MRN: [id number]             PCP: Pcp, No Referring: No ref. provider found Visit Date: 12/24/2021 Occupation: @GUAROCC @  Subjective:  Neck pain and stiffness  History of Present Illness: Meghan Alvarez is a 87 y.o. female with history of osteoarthritis, degenerative disc disease and myofascial pain syndrome.  She states she had good relief from last trapezius injections in September 2023.  She was to have repeat trapezius injections.  She has muscle spasms and discomfort in the trapezius region.  She also have some thoracic and lumbar discomfort.  She has been taking methocarbamol 500 mg daily along with Cymbalta and gabapentin which helps.  She states that she has not been doing regular exercise recently.  She continues to have some stiffness in her hands and her knee joints.  Activities of Daily Living:  Patient reports morning stiffness for 10-15 minutes.   Patient Reports nocturnal pain.  Difficulty dressing/grooming: Denies Difficulty climbing stairs: Denies Difficulty getting out of chair: Denies Difficulty using hands for taps, buttons, cutlery, and/or writing: Denies  Review of Systems  Constitutional:  Positive for fatigue.  HENT:  Negative for mouth sores and mouth dryness.   Eyes:  Positive for dryness.  Respiratory:  Negative for shortness of breath.   Cardiovascular:  Negative for chest pain and palpitations.  Gastrointestinal:  Positive for constipation. Negative for blood in stool and diarrhea.  Endocrine: Negative for increased urination.  Genitourinary:  Negative for involuntary urination.  Musculoskeletal:  Positive for joint pain, joint pain, myalgias, morning stiffness, muscle tenderness and myalgias. Negative for gait problem, joint swelling and muscle weakness.  Skin:  Negative for color change, rash, hair loss and sensitivity to sunlight.  Allergic/Immunologic:  Negative for susceptible to infections.  Neurological:  Negative for dizziness and headaches.  Hematological:  Negative for swollen glands.  Psychiatric/Behavioral:  Positive for depressed mood. Negative for sleep disturbance. The patient is nervous/anxious.     PMFS History:  Patient Active Problem List   Diagnosis Date Noted   Stage 3a chronic kidney disease (HCC) 07/11/2020   Chest pain 01/19/2020   Diabetes mellitus with coincident hypertension (HCC) 01/19/2020   Other hyperlipidemia 01/19/2020   Myofascial pain syndrome 02/04/2016   Spondylosis of lumbar region without myelopathy or radiculopathy 02/04/2016   DJD (degenerative joint disease), cervical 02/04/2016   Age-related osteoporosis without current pathological fracture 02/04/2016   Vitamin D deficiency 02/04/2016   S/P TKR (total knee replacement) using cement 03/30/2014    Past Medical History:  Diagnosis Date   Arthritis    Complication of anesthesia    Diabetes mellitus without complication (HCC)    Fibromyalgia    Hyperlipemia    PONV (postoperative nausea and vomiting)     Family History  Problem Relation Age of Onset   Cancer Sister        breast   Diabetes Brother    Past Surgical History:  Procedure Laterality Date   APPENDECTOMY  01/12/2002   ruptured    CARPAL TUNNEL RELEASE Right 05/03/2020   Procedure: RIGHT OPEN CARPAL TUNNEL RELEASE, RIGHT THUMB METACARPOPHALANGEAL JOINT INJECTION;  Surgeon: 05/05/2020, MD;  Location: [facility];  Service: Orthopedics;  Laterality: Right;   CATARACT EXTRACTION, BILATERAL  2022   CERVICAL DISCECTOMY  01/12/1989   DILATION AND CURETTAGE OF UTERUS  01/13/2003   FRACTURE SURGERY Left 01/13/2004   arm   JOINT REPLACEMENT Left 01/12/2006   knee repl   SHOULDER ARTHROSCOPY Right 01/13/1999   SHOULDER ARTHROSCOPY Left 01/12/2005   TONSILLECTOMY     TOTAL KNEE ARTHROPLASTY Right 03/30/2014   DR NORRIS   TOTAL KNEE ARTHROPLASTY Right 03/30/2014    Procedure: RIGHT TOTAL KNEE ARTHROPLASTY;  Surgeon: Beverely Low, MD;  Location[facility];  Service: Orthopedics;  Laterality: Right;   Social History   Social History Narrative   Not on file   Immunization History  Administered Date(s) Administered   Influenza, High Dose Seasonal PF 10/16/2018   Moderna Sars-Covid-2 Vaccination 01/23/2019, 02/20/2019, 11/07/2019   Zoster Recombinat (Shingrix) 11/24/2018     Objective: Vital Signs: BP 137/76 (BP Location: Left Arm, Patient Position: Sitting, Cuff Size: Normal)   Pulse 82   Resp 17   Ht 5' 5.5" (1.664 m)   Wt 193 lb 6.4 oz (87.7 kg)   BMI 31.69 kg/m    Physical Exam Vitals and nursing note reviewed.  Constitutional:      Appearance: She is well-developed.  HENT:     Head: Normocephalic and atraumatic.  Eyes:     Conjunctiva/sclera: Conjunctivae normal.  Cardiovascular:     Rate and Rhythm: Normal rate and regular rhythm.     Heart sounds: Normal heart sounds.  Pulmonary:     Effort: Pulmonary effort is normal.     Breath sounds: Normal breath sounds.  Abdominal:     General: Bowel sounds are normal.     Palpations: Abdomen is soft.  Musculoskeletal:     Cervical back: Normal range of motion.  Lymphadenopathy:     Cervical: No cervical adenopathy.  Skin:    General: Skin is warm and dry.     Capillary Refill: Capillary refill takes less than 2 seconds.  Neurological:     Mental Status: She is alert and oriented to person, place, and time.  Psychiatric:        Behavior: Behavior normal.      Musculoskeletal Exam: Cervical spine was in good range of motion.  She had bilateral trapezius spasm.  Shoulder joints, elbow joints, wrist joints, MCPs PIPs and DIPs with good range of motion with no synovitis.  Hip joints and knee joints were in good range of motion.  She had no tenderness over ankles or MTPs.  CDAI Exam: CDAI Score: -- Patient Global: --; Provider Global: -- Swollen: --; Tender: -- Joint Exam 12/24/2021    No joint exam has been documented for this visit   There is currently no information documented on the homunculus. Go to the Rheumatology activity and complete the homunculus joint exam.  Investigation: No additional findings.  Imaging: No results found.  Recent Labs: Lab Results  Component Value Date   WBC 5.0 05/02/2020   HGB 13.0 05/02/2020   PLT 199 05/02/2020   NA 138 05/02/2020   K 4.5 05/02/2020   CL 106 05/02/2020   CO2 26 05/02/2020   GLUCOSE 93 05/02/2020   BUN 9 05/02/2020   CREATININE 0.90 05/02/2020   BILITOT 0.5 08/17/2017   ALKPHOS 62 08/17/2017   AST 33 08/17/2017   ALT 33 08/17/2017   PROT 7.7 08/17/2017   ALBUMIN 4.5 08/17/2017   CALCIUM 9.8 05/02/2020   GFRAA >60 08/17/2017    Speciality Comments: No specialty comments available.  Procedures:  Trigger Point Inj  Date/Time: 12/24/2021 2:32 PM  Performed by: Pollyann Savoy, MD Authorized by: Pollyann Savoy, MD   Consent Given by:  Patient Site marked: the procedure site was marked   Timeout: prior to procedure the correct patient, procedure, and site was verified   Indications:  Muscle spasm and pain Total # of Trigger Points:  2 Location: neck   Needle Size:  27 G Approach:  Dorsal Medications #1:  0.5 mL lidocaine 1 %; 10 mg triamcinolone acetonide 40 MG/ML Medications #2:  0.5 mL lidocaine 1 %; 10 mg triamcinolone acetonide 40 MG/ML Patient tolerance:  Patient tolerated the procedure well with no immediate complications  Allergies: Azithromycin and Codeine   Assessment / Plan:     Visit Diagnoses: Myofascial pain syndrome -she continues to have generalized pain and discomfort.  She has bilateral trapezius spasm and tightness.  She continues to have some lower back pain and discomfort in her hands and her feet.  She has been taking methocarbamol 500 mg 1 tablet daily as needed for muscle spasms, Cymbalta 60 mg 1 capsule daily, and gabapentin 100 mg at bedtime.  She states she  recently stopped going to the gym and will start going again.  She had some intentional weight loss but she gained weight again.  Trapezius muscle spasm-she had bilateral trapezius spasm and discomfort.  Per request after informed consent was obtained bilateral trapezius region was injected with lidocaine and Kenalog as described above.  She tolerated the procedure well.  S/P carpal tunnel release - April 2022 by Dr. Otelia Sergeant.  Doing well.  Primary osteoarthritis of both hands-she had bilateral PIP and DIP thickening.  Joint protection muscle strengthening was discussed.  Status post total bilateral knee replacement using cement-she denies any discomfort in her knee joints today.  She had good range of motion without any warmth swelling or effusion.  Lower extremity muscle strength exercises were discussed.  DDD (degenerative disc disease), cervical-she continues to have some neck stiffness.  Range of motion exercises were advised.  DDD (degenerative disc disease), lumbar -[facility].  She has intermittent lower back pain.  She had no point tenderness on the examination today.  Core strengthening exercises were discussed.  History of vitamin D deficiency-she continues to take vitamin D.  Age-related osteoporosis without current pathological fracture - DEXA was ordered by PCP.  I do not have DEXA results to review.  She is currently not taking any medications.  I advised her to discuss DEXA results and treatment options with her PCP.  Essential hypertension-blood pressure was normal today.  Other specified hearing loss of both ears  Orders: No orders of the defined types were placed in this encounter.  No orders of the defined types were placed in this encounter.   Follow-Up Instructions: Return in about 6 months (around 06/25/2022) for Osteoarthritis, Osteoporosis.   Pollyann Savoy, MD  Note - This record has been created using Animal nutritionist.  Chart creation  errors have been sought, but may not always  have been located. Such creation errors do not reflect on  the standard of medical care.

## 2021-12-18 DIAGNOSIS — H26491 Other secondary cataract, right eye: Secondary | ICD-10-CM | POA: Diagnosis not present

## 2021-12-24 ENCOUNTER — Encounter: Payer: Self-pay | Admitting: Rheumatology

## 2021-12-24 ENCOUNTER — Ambulatory Visit: Payer: Medicare Other | Attending: Rheumatology | Admitting: Rheumatology

## 2021-12-24 VITALS — BP 137/76 | HR 82 | Resp 17 | Ht 65.5 in | Wt 193.4 lb

## 2021-12-24 DIAGNOSIS — H918X3 Other specified hearing loss, bilateral: Secondary | ICD-10-CM | POA: Diagnosis not present

## 2021-12-24 DIAGNOSIS — I10 Essential (primary) hypertension: Secondary | ICD-10-CM

## 2021-12-24 DIAGNOSIS — M19042 Primary osteoarthritis, left hand: Secondary | ICD-10-CM

## 2021-12-24 DIAGNOSIS — M62838 Other muscle spasm: Secondary | ICD-10-CM

## 2021-12-24 DIAGNOSIS — M5136 Other intervertebral disc degeneration, lumbar region: Secondary | ICD-10-CM | POA: Diagnosis not present

## 2021-12-24 DIAGNOSIS — Z9889 Other specified postprocedural states: Secondary | ICD-10-CM | POA: Diagnosis not present

## 2021-12-24 DIAGNOSIS — M19041 Primary osteoarthritis, right hand: Secondary | ICD-10-CM

## 2021-12-24 DIAGNOSIS — M7918 Myalgia, other site: Secondary | ICD-10-CM

## 2021-12-24 DIAGNOSIS — Z96653 Presence of artificial knee joint, bilateral: Secondary | ICD-10-CM | POA: Diagnosis not present

## 2021-12-24 DIAGNOSIS — Z8639 Personal history of other endocrine, nutritional and metabolic disease: Secondary | ICD-10-CM

## 2021-12-24 DIAGNOSIS — M51369 Other intervertebral disc degeneration, lumbar region without mention of lumbar back pain or lower extremity pain: Secondary | ICD-10-CM

## 2021-12-24 DIAGNOSIS — M5030 Other cervical disc degeneration, unspecified cervical region: Secondary | ICD-10-CM | POA: Diagnosis not present

## 2021-12-24 DIAGNOSIS — M810 Age-related osteoporosis without current pathological fracture: Secondary | ICD-10-CM

## 2021-12-24 MED ORDER — TRIAMCINOLONE ACETONIDE 40 MG/ML IJ SUSP
10.0000 mg | INTRAMUSCULAR | Status: AC | PRN
  Administered 2021-12-24: 10 mg via INTRAMUSCULAR

## 2021-12-24 MED ORDER — LIDOCAINE HCL 1 % IJ SOLN
0.5000 mL | INTRAMUSCULAR | Status: AC | PRN
  Administered 2021-12-24: .5 mL

## 2022-01-22 DIAGNOSIS — M189 Osteoarthritis of first carpometacarpal joint, unspecified: Secondary | ICD-10-CM | POA: Diagnosis not present

## 2022-01-22 DIAGNOSIS — E7800 Pure hypercholesterolemia, unspecified: Secondary | ICD-10-CM | POA: Diagnosis not present

## 2022-01-22 DIAGNOSIS — Z0000 Encounter for general adult medical examination without abnormal findings: Secondary | ICD-10-CM | POA: Diagnosis not present

## 2022-01-22 DIAGNOSIS — M810 Age-related osteoporosis without current pathological fracture: Secondary | ICD-10-CM | POA: Diagnosis not present

## 2022-01-22 DIAGNOSIS — E118 Type 2 diabetes mellitus with unspecified complications: Secondary | ICD-10-CM | POA: Diagnosis not present

## 2022-01-22 DIAGNOSIS — M797 Fibromyalgia: Secondary | ICD-10-CM | POA: Diagnosis not present

## 2022-01-22 DIAGNOSIS — G8929 Other chronic pain: Secondary | ICD-10-CM | POA: Diagnosis not present

## 2022-01-22 DIAGNOSIS — N183 Chronic kidney disease, stage 3 unspecified: Secondary | ICD-10-CM | POA: Diagnosis not present

## 2022-01-22 DIAGNOSIS — E1169 Type 2 diabetes mellitus with other specified complication: Secondary | ICD-10-CM | POA: Diagnosis not present

## 2022-02-04 DIAGNOSIS — E118 Type 2 diabetes mellitus with unspecified complications: Secondary | ICD-10-CM | POA: Diagnosis not present

## 2022-02-12 DIAGNOSIS — H26492 Other secondary cataract, left eye: Secondary | ICD-10-CM | POA: Diagnosis not present

## 2022-02-12 HISTORY — PX: OTHER SURGICAL HISTORY: SHX169

## 2022-03-16 NOTE — Progress Notes (Signed)
Office Visit Note  Patient: Meghan Alvarez             Date of Birth: December 20, 1934           MRN: [id number]             PCP: Pcp, No Referring: No ref. provider found Visit Date: 03/30/2022 Occupation: @GUAROCC @  Subjective:  Trapezius trigger point injections   History of Present Illness: Meghan Alvarez is a 88 y.o. female with history of myofascial pain, osteoarthritis, and DDD.  She presents today with trapezius muscle tension and tenderness by laterally.  She has been experiencing muscle spasms intermittently.  She takes robaxin 500 mg 1 tablet by mouth daily as needed for muscle spasms.  She requested trigger point injections today which have alleviated her symptoms in the past.  She has been experiencing interrupted sleep at night caring for her husband.  She has been more sedentary recently due to fatigue and caring for her husband.  She plans on starting to walk again for exercise.  She will also be participating in the senior citizen games in April.  She plans to bowl, play cornhole, and several other activities.    Activities of Daily Living:  Patient reports morning stiffness for 30 minutes.   Patient Denies nocturnal pain.  Difficulty dressing/grooming: Denies Difficulty climbing stairs: Denies Difficulty getting out of chair: Denies Difficulty using hands for taps, buttons, cutlery, and/or writing: Reports  Review of Systems  Constitutional:  Positive for fatigue.  HENT: Negative.  Negative for mouth sores and mouth dryness.   Eyes:  Positive for dryness.  Respiratory: Negative.  Negative for shortness of breath.   Cardiovascular:  Positive for chest pain. Negative for palpitations.  Gastrointestinal:  Positive for constipation. Negative for blood in stool and diarrhea.  Endocrine: Negative.  Negative for increased urination.  Genitourinary: Negative.  Negative for involuntary urination.  Musculoskeletal:  Positive for joint pain, joint pain, muscle weakness and  morning stiffness. Negative for gait problem, joint swelling, myalgias, muscle tenderness and myalgias.  Skin: Negative.  Negative for color change, rash, hair loss and sensitivity to sunlight.  Allergic/Immunologic: Negative.  Negative for susceptible to infections.  Neurological: Negative.  Negative for dizziness and headaches.  Hematological: Negative.  Negative for swollen glands.  Psychiatric/Behavioral:  Positive for depressed mood. Negative for sleep disturbance. The patient is nervous/anxious.     PMFS History:  Patient Active Problem List   Diagnosis Date Noted   Stage 3a chronic kidney disease ([place]) 07/11/2020   Chest pain 01/19/2020   Diabetes mellitus with coincident hypertension ([place]) 01/19/2020   Other hyperlipidemia 01/19/2020   Myofascial pain syndrome 02/04/2016   Spondylosis of lumbar region without myelopathy or radiculopathy 02/04/2016   DJD (degenerative joint disease), cervical 02/04/2016   Age-related osteoporosis without current pathological fracture 02/04/2016   Vitamin D deficiency 02/04/2016   S/P TKR (total knee replacement) using cement 03/30/2014    Past Medical History:  Diagnosis Date   Arthritis    Complication of anesthesia    Diabetes mellitus without complication (HCC)    Fibromyalgia    Hyperlipemia    PONV (postoperative nausea and vomiting)     Family History  Problem Relation Age of Onset   Cancer Sister        breast   Diabetes Brother    Past Surgical History:  Procedure Laterality Date   APPENDECTOMY  01/12/2002   ruptured    CARPAL TUNNEL RELEASE Right 05/03/2020  Procedure: RIGHT OPEN CARPAL TUNNEL RELEASE, RIGHT THUMB METACARPOPHALANGEAL JOINT INJECTION;  Surgeon: Jessy Oto, MD;  Location: [facility];  Service: Orthopedics;  Laterality: Right;   CATARACT EXTRACTION, BILATERAL  2022   CERVICAL DISCECTOMY  01/12/1989   DILATION AND CURETTAGE OF UTERUS  01/13/2003   EYE PROCEDURE Right 02/12/2022   FRACTURE  SURGERY Left 01/13/2004   arm   JOINT REPLACEMENT Left 01/12/2006   knee repl   SHOULDER ARTHROSCOPY Right 01/13/1999   SHOULDER ARTHROSCOPY Left 01/12/2005   TONSILLECTOMY     TOTAL KNEE ARTHROPLASTY Right 03/30/2014   DR NORRIS   TOTAL KNEE ARTHROPLASTY Right 03/30/2014   Procedure: RIGHT TOTAL KNEE ARTHROPLASTY;  Surgeon: Netta Cedars, MD;  Location: [facility];  Service: Orthopedics;  Laterality: Right;   Social History   Social History Narrative   Not on file   Immunization History  Administered Date(s) Administered   Influenza, High Dose Seasonal PF 10/16/2018   Moderna Sars-Covid-2 Vaccination 01/23/2019, 02/20/2019, 11/07/2019   Zoster Recombinat (Shingrix) 11/24/2018     Objective: Vital Signs: BP 128/85 (BP Location: Right Arm, Patient Position: Sitting, Cuff Size: Large)   Pulse 75   Resp 16   Ht 5' 5.5" (1.664 m)   Wt 185 lb (83.9 kg)   BMI 30.32 kg/m    Physical Exam Vitals and nursing note reviewed.  Constitutional:      Appearance: She is well-developed.  HENT:     Head: Normocephalic and atraumatic.  Eyes:     Conjunctiva/sclera: Conjunctivae normal.  Cardiovascular:     Rate and Rhythm: Normal rate and regular rhythm.     Heart sounds: Normal heart sounds.  Pulmonary:     Effort: Pulmonary effort is normal.     Breath sounds: Normal breath sounds.  Abdominal:     General: Bowel sounds are normal.     Palpations: Abdomen is soft.  Musculoskeletal:     Cervical back: Normal range of motion.  Skin:    General: Skin is warm and dry.     Capillary Refill: Capillary refill takes less than 2 seconds.  Neurological:     Mental Status: She is alert and oriented to person, place, and time.  Psychiatric:        Behavior: Behavior normal.      Musculoskeletal Exam: C-spine has limited ROM with lateral rotation.  Trapezius muscle tension and tenderness bilaterally.  Thoracic and lumbar spine good ROM.  Shoulder joints, elbow joints, wrist joints, Mcps,  PIPs, and DIPs good ROM with no synovitis.  PIP and DIP thickening consistent with osteoarthritis of both hands.  Hip joints have good ROM with no groin pain.  Knee replacements have good ROM with no warmth or effusion.  Ankle joints have good ROM with no tenderness or joint swelling.   CDAI Exam: CDAI Score: -- Patient Global: --; Provider Global: -- Swollen: --; Tender: -- Joint Exam 03/30/2022   No joint exam has been documented for this visit   There is currently no information documented on the homunculus. Go to the Rheumatology activity and complete the homunculus joint exam.  Investigation: No additional findings.  Imaging: No results found.  Recent Labs: Lab Results  Component Value Date   WBC 5.0 05/02/2020   HGB 13.0 05/02/2020   PLT 199 05/02/2020   NA 138 05/02/2020   K 4.5 05/02/2020   CL 106 05/02/2020   CO2 26 05/02/2020   GLUCOSE 93 05/02/2020   BUN 9 05/02/2020  CREATININE 0.90 05/02/2020   BILITOT 0.5 08/17/2017   ALKPHOS 62 08/17/2017   AST 33 08/17/2017   ALT 33 08/17/2017   PROT 7.7 08/17/2017   ALBUMIN 4.5 08/17/2017   CALCIUM 9.8 05/02/2020   GFRAA >60 08/17/2017    Speciality Comments: No specialty comments available.  Procedures:  Trigger Point Inj  Date/Time: 03/30/2022 2:10 PM  Performed by: Ofilia Neas, PA-C Authorized by: Ofilia Neas, PA-C   Consent Given by:  Patient Site marked: the procedure site was marked   Timeout: prior to procedure the correct patient, procedure, and site was verified   Indications:  Pain Total # of Trigger Points:  2 Location: neck   Needle Size:  27 G Approach:  Dorsal Medications #1:  0.5 mL lidocaine 1 %; 10 mg triamcinolone acetonide 40 MG/ML Medications #2:  0.5 mL lidocaine 1 %; 10 mg triamcinolone acetonide 40 MG/ML Patient tolerance:  Patient tolerated the procedure well with no immediate complications  Allergies: Azithromycin and Codeine    Assessment / Plan:     Visit Diagnoses:  Myofascial pain syndrome: She presents today with trapezius muscle tension and tenderness bilaterally.  She has been experiencing muscle spasms intermittently.  She had trigger point injections performed on 12/24/2021 which provided significant relief but her symptoms have returned.  She requested repeat trigger point injections today.  She tolerated the procedures well.  Procedure notes were completed above.  Aftercare was discussed.  She plans on continuing to take Cymbalta 60 mg 1 capsule daily, gabapentin 100 mg at bedtime, and methocarbamol 500 mg 1 tablet daily as needed for muscle spasms. Discussed the importance of regular exercise and good sleep hygiene.  She plans on increasing her walking regimen.  She will follow-up in the office in 6 months or sooner if needed.  Trapezius muscle spasm: Trigger point injections performed on 12/24/21. Bilateral trapezius trigger point injections performed today.  She tolerated the procedures well.  Aftercare was discussed. Procedure notes completed above.    S/P carpal tunnel release - April 2022 by Dr. Louanne Skye. Asymptomatic at this time.   Primary osteoarthritis of both hands: She has PIP and DIP thickening consistent with osteoarthritis of both hands.  No synovitis noted.  Complete fist formation bilaterally.   Status post total bilateral knee replacement using cement: Doing well.  Good ROM with no discomfort.  No warmth or effusion.  Patient plans on increasing her walking regimen.   DDD (degenerative disc disease), cervical: Limited ROM with lateral rotation. Trapezius muscle tension and tenderness bilaterally.  Trigger point injections performed today. Procedure notes completed above.   DDD (degenerative disc disease), lumbar: No midline spinal tenderness.  No symptoms of radiculopathy at this time.   History of vitamin D deficiency: She is taking vitamin D 1000 units daily.    Age-related osteoporosis without current pathological fracture - DEXA  ordered by PCP  Essential hypertension: BP was 128/85 today in the ofice.   Orders: Orders Placed This Encounter  Procedures   Trigger Point Inj   No orders of the defined types were placed in this encounter.     Follow-Up Instructions: Return in about 3 months (around 06/30/2022) for Myofascial pain, Osteoarthritis, DDD.   Ofilia Neas, PA-C  Note - This record has been created using Dragon software.  Chart creation errors have been sought, but may not always  have been located. Such creation errors do not reflect on  the standard of medical care.

## 2022-03-30 ENCOUNTER — Encounter: Payer: Self-pay | Admitting: Physician Assistant

## 2022-03-30 ENCOUNTER — Ambulatory Visit: Payer: Medicare Other | Attending: Physician Assistant | Admitting: Physician Assistant

## 2022-03-30 VITALS — BP 128/85 | HR 75 | Resp 16 | Ht 65.5 in | Wt 185.0 lb

## 2022-03-30 DIAGNOSIS — M19042 Primary osteoarthritis, left hand: Secondary | ICD-10-CM | POA: Diagnosis not present

## 2022-03-30 DIAGNOSIS — M5136 Other intervertebral disc degeneration, lumbar region: Secondary | ICD-10-CM

## 2022-03-30 DIAGNOSIS — M19041 Primary osteoarthritis, right hand: Secondary | ICD-10-CM

## 2022-03-30 DIAGNOSIS — Z96653 Presence of artificial knee joint, bilateral: Secondary | ICD-10-CM | POA: Diagnosis not present

## 2022-03-30 DIAGNOSIS — M7918 Myalgia, other site: Secondary | ICD-10-CM

## 2022-03-30 DIAGNOSIS — Z8639 Personal history of other endocrine, nutritional and metabolic disease: Secondary | ICD-10-CM | POA: Diagnosis not present

## 2022-03-30 DIAGNOSIS — M5030 Other cervical disc degeneration, unspecified cervical region: Secondary | ICD-10-CM | POA: Diagnosis not present

## 2022-03-30 DIAGNOSIS — M62838 Other muscle spasm: Secondary | ICD-10-CM | POA: Diagnosis not present

## 2022-03-30 DIAGNOSIS — Z9889 Other specified postprocedural states: Secondary | ICD-10-CM

## 2022-03-30 DIAGNOSIS — M810 Age-related osteoporosis without current pathological fracture: Secondary | ICD-10-CM

## 2022-03-30 DIAGNOSIS — I10 Essential (primary) hypertension: Secondary | ICD-10-CM | POA: Diagnosis not present

## 2022-03-30 MED ORDER — TRIAMCINOLONE ACETONIDE 40 MG/ML IJ SUSP
10.0000 mg | INTRAMUSCULAR | Status: AC | PRN
  Administered 2022-03-30: 10 mg via INTRAMUSCULAR

## 2022-03-30 MED ORDER — LIDOCAINE HCL 1 % IJ SOLN
0.5000 mL | INTRAMUSCULAR | Status: AC | PRN
  Administered 2022-03-30: .5 mL

## 2022-06-19 ENCOUNTER — Other Ambulatory Visit: Payer: Self-pay | Admitting: Rheumatology

## 2022-06-19 NOTE — Telephone Encounter (Signed)
Last Fill: 10/08/2021  Next Visit: 07/06/2022  Last Visit: 03/30/2022  Dx: Myofascial pain syndrome   Current Dose per office note on 03/30/2022: methocarbamol 500 mg 1 tablet daily as needed for muscle spasms   Okay to refill Methocarbamol?

## 2022-06-22 NOTE — Progress Notes (Deleted)
Office Visit Note  Patient: Meghan Alvarez             Date of Birth: 1934-03-22           MRN: [id number]             PCP: Pcp, No Referring: No ref. provider found Visit Date: 07/06/2022 Occupation: @GUAROCC @  Subjective:  Trapezius muscle spasms   History of Present Illness: XOCHILT CONANT is a 88 y.o. female with history of myofascial pain and osteoarthritis.  Patient presents today with ongoing pain from trapezius muscle tension and tenderness bilaterally.  She requested repeat trigger point injections today.   Robaxin  Cymbalta Gabapentin    Activities of Daily Living:  Patient reports morning stiffness for *** {minute/hour:19697}.   Patient {ACTIONS;DENIES/REPORTS:21021675::"Denies"} nocturnal pain.  Difficulty dressing/grooming: {ACTIONS;DENIES/REPORTS:21021675::"Denies"} Difficulty climbing stairs: {ACTIONS;DENIES/REPORTS:21021675::"Denies"} Difficulty getting out of chair: {ACTIONS;DENIES/REPORTS:21021675::"Denies"} Difficulty using hands for taps, buttons, cutlery, and/or writing: {ACTIONS;DENIES/REPORTS:21021675::"Denies"}  No Rheumatology ROS completed.   PMFS History:  Patient Active Problem List   Diagnosis Date Noted   Stage 3a chronic kidney disease (HCC) 07/11/2020   Chest pain 01/19/2020   Diabetes mellitus with coincident hypertension (HCC) 01/19/2020   Other hyperlipidemia 01/19/2020   Myofascial pain syndrome 02/04/2016   Spondylosis of lumbar region without myelopathy or radiculopathy 02/04/2016   DJD (degenerative joint disease), cervical 02/04/2016   Age-related osteoporosis without current pathological fracture 02/04/2016   Vitamin D deficiency 02/04/2016   S/P TKR (total knee replacement) using cement 03/30/2014    Past Medical History:  Diagnosis Date   Arthritis    Complication of anesthesia    Diabetes mellitus without complication (HCC)    Fibromyalgia    Hyperlipemia    PONV (postoperative nausea and vomiting)     Family  History  Problem Relation Age of Onset   Cancer Sister        breast   Diabetes Brother    Past Surgical History:  Procedure Laterality Date   APPENDECTOMY  01/12/2002   ruptured    CARPAL TUNNEL RELEASE Right 05/03/2020   Procedure: RIGHT OPEN CARPAL TUNNEL RELEASE, RIGHT THUMB METACARPOPHALANGEAL JOINT INJECTION;  Surgeon: Kerrin Champagne, MD;  Location: [facility];  Service: Orthopedics;  Laterality: Right;   CATARACT EXTRACTION, BILATERAL  2022   CERVICAL DISCECTOMY  01/12/1989   DILATION AND CURETTAGE OF UTERUS  01/13/2003   EYE PROCEDURE Right 02/12/2022   FRACTURE SURGERY Left 01/13/2004   arm   JOINT REPLACEMENT Left 01/12/2006   knee repl   SHOULDER ARTHROSCOPY Right 01/13/1999   SHOULDER ARTHROSCOPY Left 01/12/2005   TONSILLECTOMY     TOTAL KNEE ARTHROPLASTY Right 03/30/2014   DR NORRIS   TOTAL KNEE ARTHROPLASTY Right 03/30/2014   Procedure: RIGHT TOTAL KNEE ARTHROPLASTY;  Surgeon: Beverely Low, MD;  Location: [facility] OR;  Service: Orthopedics;  Laterality: Right;   Social History   Social History Narrative   Not on file   Immunization History  Administered Date(s) Administered   Influenza, High Dose Seasonal PF 10/16/2018   Moderna Sars-Covid-2 Vaccination 01/23/2019, 02/20/2019, 11/07/2019   Zoster Recombinat (Shingrix) 11/24/2018     Objective: Vital Signs: There were no vitals taken for this visit.   Physical Exam Vitals and nursing note reviewed.  Constitutional:      Appearance: She is well-developed.  HENT:     Head: Normocephalic and atraumatic.  Eyes:     Conjunctiva/sclera: Conjunctivae normal.  Cardiovascular:     Rate and Rhythm: Normal rate  and regular rhythm.     Heart sounds: Normal heart sounds.  Pulmonary:     Effort: Pulmonary effort is normal.     Breath sounds: Normal breath sounds.  Abdominal:     General: Bowel sounds are normal.     Palpations: Abdomen is soft.  Musculoskeletal:     Cervical back: Normal range of  motion.  Lymphadenopathy:     Cervical: No cervical adenopathy.  Skin:    General: Skin is warm and dry.     Capillary Refill: Capillary refill takes less than 2 seconds.  Neurological:     Mental Status: She is alert and oriented to person, place, and time.  Psychiatric:        Behavior: Behavior normal.      Musculoskeletal Exam: ***  CDAI Exam: CDAI Score: -- Patient Global: --; Provider Global: -- Swollen: --; Tender: -- Joint Exam 07/06/2022   No joint exam has been documented for this visit   There is currently no information documented on the homunculus. Go to the Rheumatology activity and complete the homunculus joint exam.  Investigation: No additional findings.  Imaging: No results found.  Recent Labs: Lab Results  Component Value Date   WBC 5.0 05/02/2020   HGB 13.0 05/02/2020   PLT 199 05/02/2020   NA 138 05/02/2020   K 4.5 05/02/2020   CL 106 05/02/2020   CO2 26 05/02/2020   GLUCOSE 93 05/02/2020   BUN 9 05/02/2020   CREATININE 0.90 05/02/2020   BILITOT 0.5 08/17/2017   ALKPHOS 62 08/17/2017   AST 33 08/17/2017   ALT 33 08/17/2017   PROT 7.7 08/17/2017   ALBUMIN 4.5 08/17/2017   CALCIUM 9.8 05/02/2020   GFRAA >60 08/17/2017    Speciality Comments: No specialty comments available.  Procedures:  No procedures performed Allergies: Azithromycin and Codeine   Assessment / Plan:     Visit Diagnoses: No diagnosis found.  Orders: No orders of the defined types were placed in this encounter.  No orders of the defined types were placed in this encounter.   Face-to-face time spent with patient was *** minutes. Greater than 50% of time was spent in counseling and coordination of care.  Follow-Up Instructions: No follow-ups on file.   Ellen Henri, CMA  Note - This record has been created using Animal nutritionist.  Chart creation errors have been sought, but may not always  have been located. Such creation errors do not reflect on  the  standard of medical care.

## 2022-07-06 ENCOUNTER — Ambulatory Visit: Payer: Medicare Other | Admitting: Physician Assistant

## 2022-07-06 DIAGNOSIS — M7918 Myalgia, other site: Secondary | ICD-10-CM

## 2022-07-06 DIAGNOSIS — Z9889 Other specified postprocedural states: Secondary | ICD-10-CM

## 2022-07-06 DIAGNOSIS — Z8639 Personal history of other endocrine, nutritional and metabolic disease: Secondary | ICD-10-CM

## 2022-07-06 DIAGNOSIS — M62838 Other muscle spasm: Secondary | ICD-10-CM

## 2022-07-06 DIAGNOSIS — Z1283 Encounter for screening for malignant neoplasm of skin: Secondary | ICD-10-CM | POA: Diagnosis not present

## 2022-07-06 DIAGNOSIS — M5030 Other cervical disc degeneration, unspecified cervical region: Secondary | ICD-10-CM

## 2022-07-06 DIAGNOSIS — M5136 Other intervertebral disc degeneration, lumbar region: Secondary | ICD-10-CM

## 2022-07-06 DIAGNOSIS — K5909 Other constipation: Secondary | ICD-10-CM | POA: Diagnosis not present

## 2022-07-06 DIAGNOSIS — M19041 Primary osteoarthritis, right hand: Secondary | ICD-10-CM

## 2022-07-06 DIAGNOSIS — D225 Melanocytic nevi of trunk: Secondary | ICD-10-CM | POA: Diagnosis not present

## 2022-07-06 DIAGNOSIS — L820 Inflamed seborrheic keratosis: Secondary | ICD-10-CM | POA: Diagnosis not present

## 2022-07-06 DIAGNOSIS — Z96653 Presence of artificial knee joint, bilateral: Secondary | ICD-10-CM

## 2022-07-06 DIAGNOSIS — M810 Age-related osteoporosis without current pathological fracture: Secondary | ICD-10-CM

## 2022-07-06 DIAGNOSIS — I10 Essential (primary) hypertension: Secondary | ICD-10-CM

## 2022-07-06 NOTE — Progress Notes (Unsigned)
Office Visit Note  Patient: Meghan Alvarez             Date of Birth: December 13, 1934           MRN: [id number]             PCP: Pcp, No Referring: No ref. provider found Visit Date: 07/07/2022 Occupation: @GUAROCC @  Subjective:  Trapezius muscle spasms   History of Present Illness: Meghan Alvarez is a 88 y.o. female with history of myofascial pain and DDD.  Patient remains on Cymbalta 60 mg 1 capsule daily, gabapentin 100 mg at bedtime, and methocarbamol 500 mg 1 tablet daily as needed for muscle spasms.   She underwent bilateral trapezius trigger point injections on 03/30/22.     Activities of Daily Living:  Patient reports morning stiffness for *** {minute/hour:19697}.   Patient {ACTIONS;DENIES/REPORTS:21021675::"Denies"} nocturnal pain.  Difficulty dressing/grooming: {ACTIONS;DENIES/REPORTS:21021675::"Denies"} Difficulty climbing stairs: {ACTIONS;DENIES/REPORTS:21021675::"Denies"} Difficulty getting out of chair: {ACTIONS;DENIES/REPORTS:21021675::"Denies"} Difficulty using hands for taps, buttons, cutlery, and/or writing: {ACTIONS;DENIES/REPORTS:21021675::"Denies"}  No Rheumatology ROS completed.   PMFS History:  Patient Active Problem List   Diagnosis Date Noted   Stage 3a chronic kidney disease (HCC) 07/11/2020   Chest pain 01/19/2020   Diabetes mellitus with coincident hypertension (HCC) 01/19/2020   Other hyperlipidemia 01/19/2020   Myofascial pain syndrome 02/04/2016   Spondylosis of lumbar region without myelopathy or radiculopathy 02/04/2016   DJD (degenerative joint disease), cervical 02/04/2016   Age-related osteoporosis without current pathological fracture 02/04/2016   Vitamin D deficiency 02/04/2016   S/P TKR (total knee replacement) using cement 03/30/2014    Past Medical History:  Diagnosis Date   Arthritis    Complication of anesthesia    Diabetes mellitus without complication (HCC)    Fibromyalgia    Hyperlipemia    PONV (postoperative nausea and  vomiting)     Family History  Problem Relation Age of Onset   Cancer Sister        breast   Diabetes Brother    Past Surgical History:  Procedure Laterality Date   APPENDECTOMY  01/12/2002   ruptured    CARPAL TUNNEL RELEASE Right 05/03/2020   Procedure: RIGHT OPEN CARPAL TUNNEL RELEASE, RIGHT THUMB METACARPOPHALANGEAL JOINT INJECTION;  Surgeon: Kerrin Champagne, MD;  Location: [facility];  Service: Orthopedics;  Laterality: Right;   CATARACT EXTRACTION, BILATERAL  2022   CERVICAL DISCECTOMY  01/12/1989   DILATION AND CURETTAGE OF UTERUS  01/13/2003   EYE PROCEDURE Right 02/12/2022   FRACTURE SURGERY Left 01/13/2004   arm   JOINT REPLACEMENT Left 01/12/2006   knee repl   SHOULDER ARTHROSCOPY Right 01/13/1999   SHOULDER ARTHROSCOPY Left 01/12/2005   TONSILLECTOMY     TOTAL KNEE ARTHROPLASTY Right 03/30/2014   DR NORRIS   TOTAL KNEE ARTHROPLASTY Right 03/30/2014   Procedure: RIGHT TOTAL KNEE ARTHROPLASTY;  Surgeon: Beverely Low, MD;  Location: [facility] OR;  Service: Orthopedics;  Laterality: Right;   Social History   Social History Narrative   Not on file   Immunization History  Administered Date(s) Administered   Influenza, High Dose Seasonal PF 10/16/2018   Moderna Sars-Covid-2 Vaccination 01/23/2019, 02/20/2019, 11/07/2019   Zoster Recombinat (Shingrix) 11/24/2018     Objective: Vital Signs: There were no vitals taken for this visit.   Physical Exam Vitals and nursing note reviewed.  Constitutional:      Appearance: She is well-developed.  HENT:     Head: Normocephalic and atraumatic.  Eyes:     Conjunctiva/sclera: Conjunctivae normal.  Cardiovascular:     Rate and Rhythm: Normal rate and regular rhythm.     Heart sounds: Normal heart sounds.  Pulmonary:     Effort: Pulmonary effort is normal.     Breath sounds: Normal breath sounds.  Abdominal:     General: Bowel sounds are normal.     Palpations: Abdomen is soft.  Musculoskeletal:     Cervical  back: Normal range of motion.  Lymphadenopathy:     Cervical: No cervical adenopathy.  Skin:    General: Skin is warm and dry.     Capillary Refill: Capillary refill takes less than 2 seconds.  Neurological:     Mental Status: She is alert and oriented to person, place, and time.  Psychiatric:        Behavior: Behavior normal.      Musculoskeletal Exam: ***  CDAI Exam: CDAI Score: -- Patient Global: --; Provider Global: -- Swollen: --; Tender: -- Joint Exam 07/07/2022   No joint exam has been documented for this visit   There is currently no information documented on the homunculus. Go to the Rheumatology activity and complete the homunculus joint exam.  Investigation: No additional findings.  Imaging: No results found.  Recent Labs: Lab Results  Component Value Date   WBC 5.0 05/02/2020   HGB 13.0 05/02/2020   PLT 199 05/02/2020   NA 138 05/02/2020   K 4.5 05/02/2020   CL 106 05/02/2020   CO2 26 05/02/2020   GLUCOSE 93 05/02/2020   BUN 9 05/02/2020   CREATININE 0.90 05/02/2020   BILITOT 0.5 08/17/2017   ALKPHOS 62 08/17/2017   AST 33 08/17/2017   ALT 33 08/17/2017   PROT 7.7 08/17/2017   ALBUMIN 4.5 08/17/2017   CALCIUM 9.8 05/02/2020   GFRAA >60 08/17/2017    Speciality Comments: No specialty comments available.  Procedures:  No procedures performed Allergies: Azithromycin and Codeine   Assessment / Plan:     Visit Diagnoses: No diagnosis found.  Orders: No orders of the defined types were placed in this encounter.  No orders of the defined types were placed in this encounter.     Follow-Up Instructions: No follow-ups on file.   Ellen Henri, CMA  Note - This record has been created using Animal nutritionist.  Chart creation errors have been sought, but may not always  have been located. Such creation errors do not reflect on  the standard of medical care.

## 2022-07-07 ENCOUNTER — Encounter: Payer: Self-pay | Admitting: Physician Assistant

## 2022-07-07 ENCOUNTER — Ambulatory Visit: Payer: Medicare Other | Attending: Physician Assistant | Admitting: Physician Assistant

## 2022-07-07 VITALS — BP 132/82 | HR 76 | Resp 15 | Ht 65.5 in | Wt 189.0 lb

## 2022-07-07 DIAGNOSIS — M791 Myalgia, unspecified site: Secondary | ICD-10-CM

## 2022-07-07 DIAGNOSIS — M62838 Other muscle spasm: Secondary | ICD-10-CM | POA: Diagnosis not present

## 2022-07-07 DIAGNOSIS — M19042 Primary osteoarthritis, left hand: Secondary | ICD-10-CM

## 2022-07-07 DIAGNOSIS — I10 Essential (primary) hypertension: Secondary | ICD-10-CM | POA: Diagnosis not present

## 2022-07-07 DIAGNOSIS — M5136 Other intervertebral disc degeneration, lumbar region: Secondary | ICD-10-CM | POA: Diagnosis not present

## 2022-07-07 DIAGNOSIS — Z8639 Personal history of other endocrine, nutritional and metabolic disease: Secondary | ICD-10-CM | POA: Diagnosis not present

## 2022-07-07 DIAGNOSIS — Z96653 Presence of artificial knee joint, bilateral: Secondary | ICD-10-CM | POA: Diagnosis not present

## 2022-07-07 DIAGNOSIS — Z9889 Other specified postprocedural states: Secondary | ICD-10-CM

## 2022-07-07 DIAGNOSIS — M19041 Primary osteoarthritis, right hand: Secondary | ICD-10-CM

## 2022-07-07 DIAGNOSIS — M7918 Myalgia, other site: Secondary | ICD-10-CM

## 2022-07-07 DIAGNOSIS — M5030 Other cervical disc degeneration, unspecified cervical region: Secondary | ICD-10-CM | POA: Diagnosis not present

## 2022-07-07 DIAGNOSIS — M810 Age-related osteoporosis without current pathological fracture: Secondary | ICD-10-CM | POA: Diagnosis not present

## 2022-07-07 MED ORDER — LIDOCAINE HCL 1 % IJ SOLN
0.5000 mL | INTRAMUSCULAR | Status: AC | PRN
Start: 2022-07-07 — End: 2022-07-07
  Administered 2022-07-07: .5 mL

## 2022-07-07 MED ORDER — TRIAMCINOLONE ACETONIDE 40 MG/ML IJ SUSP
10.0000 mg | INTRAMUSCULAR | Status: AC | PRN
Start: 2022-07-07 — End: 2022-07-07
  Administered 2022-07-07: 10 mg via INTRAMUSCULAR

## 2022-08-07 DIAGNOSIS — S20212A Contusion of left front wall of thorax, initial encounter: Secondary | ICD-10-CM | POA: Diagnosis not present

## 2022-08-07 DIAGNOSIS — W19XXXA Unspecified fall, initial encounter: Secondary | ICD-10-CM | POA: Diagnosis not present

## 2022-08-19 DIAGNOSIS — H26493 Other secondary cataract, bilateral: Secondary | ICD-10-CM | POA: Diagnosis not present

## 2022-08-19 DIAGNOSIS — Z961 Presence of intraocular lens: Secondary | ICD-10-CM | POA: Diagnosis not present

## 2022-08-31 DIAGNOSIS — G479 Sleep disorder, unspecified: Secondary | ICD-10-CM | POA: Diagnosis not present

## 2022-08-31 DIAGNOSIS — E118 Type 2 diabetes mellitus with unspecified complications: Secondary | ICD-10-CM | POA: Diagnosis not present

## 2022-08-31 DIAGNOSIS — R059 Cough, unspecified: Secondary | ICD-10-CM | POA: Diagnosis not present

## 2022-09-23 NOTE — Progress Notes (Unsigned)
Office Visit Note  Patient: Meghan Alvarez             Date of Birth: Oct 22, 1934           MRN: [id number]             PCP: Pcp, No Referring: No ref. provider found Visit Date: 10/07/2022 Occupation: @GUAROCC @  Subjective:  Trapezius muscle spasms   History of Present Illness: Meghan Alvarez is a 88 y.o. female with history of myofascial pain and osteoarthritis. Patient had bilateral trapezius trigger point injections on 07/07/22.     Activities of Daily Living:  Patient reports morning stiffness for *** {minute/hour:19697}.   Patient {ACTIONS;DENIES/REPORTS:21021675::"Denies"} nocturnal pain.  Difficulty dressing/grooming: {ACTIONS;DENIES/REPORTS:21021675::"Denies"} Difficulty climbing stairs: {ACTIONS;DENIES/REPORTS:21021675::"Denies"} Difficulty getting out of chair: {ACTIONS;DENIES/REPORTS:21021675::"Denies"} Difficulty using hands for taps, buttons, cutlery, and/or writing: {ACTIONS;DENIES/REPORTS:21021675::"Denies"}  No Rheumatology ROS completed.   PMFS History:  Patient Active Problem List   Diagnosis Date Noted   Stage 3a chronic kidney disease (HCC) 07/11/2020   Chest pain 01/19/2020   Diabetes mellitus with coincident hypertension (HCC) 01/19/2020   Other hyperlipidemia 01/19/2020   Myofascial pain syndrome 02/04/2016   Spondylosis of lumbar region without myelopathy or radiculopathy 02/04/2016   DJD (degenerative joint disease), cervical 02/04/2016   Age-related osteoporosis without current pathological fracture 02/04/2016   Vitamin D deficiency 02/04/2016   S/P TKR (total knee replacement) using cement 03/30/2014    Past Medical History:  Diagnosis Date   Arthritis    Complication of anesthesia    Diabetes mellitus without complication (HCC)    Fibromyalgia    Hyperlipemia    PONV (postoperative nausea and vomiting)     Family History  Problem Relation Age of Onset   Cancer Sister        breast   Diabetes Brother    Past Surgical History:   Procedure Laterality Date   APPENDECTOMY  01/12/2002   ruptured    CARPAL TUNNEL RELEASE Right 05/03/2020   Procedure: RIGHT OPEN CARPAL TUNNEL RELEASE, RIGHT THUMB METACARPOPHALANGEAL JOINT INJECTION;  Surgeon: Kerrin Champagne, MD;  Location: [facility];  Service: Orthopedics;  Laterality: Right;   CATARACT EXTRACTION, BILATERAL  2022   CERVICAL DISCECTOMY  01/12/1989   DILATION AND CURETTAGE OF UTERUS  01/13/2003   EYE PROCEDURE Right 02/12/2022   FRACTURE SURGERY Left 01/13/2004   arm   JOINT REPLACEMENT Left 01/12/2006   knee repl   SHOULDER ARTHROSCOPY Right 01/13/1999   SHOULDER ARTHROSCOPY Left 01/12/2005   TONSILLECTOMY     TOTAL KNEE ARTHROPLASTY Right 03/30/2014   DR NORRIS   TOTAL KNEE ARTHROPLASTY Right 03/30/2014   Procedure: RIGHT TOTAL KNEE ARTHROPLASTY;  Surgeon: Beverely Low, MD;  Location: [facility];  Service: Orthopedics;  Laterality: Right;   Social History   Social History Narrative   Not on file   Immunization History  Administered Date(s) Administered   Influenza, High Dose Seasonal PF 10/16/2018   Moderna Sars-Covid-2 Vaccination 01/23/2019, 02/20/2019, 11/07/2019   Zoster Recombinant(Shingrix) 11/24/2018     Objective: Vital Signs: There were no vitals taken for this visit.   Physical Exam Vitals and nursing note reviewed.  Constitutional:      Appearance: She is well-developed.  HENT:     Head: Normocephalic and atraumatic.  Eyes:     Conjunctiva/sclera: Conjunctivae normal.  Cardiovascular:     Rate and Rhythm: Normal rate and regular rhythm.     Heart sounds: Normal heart sounds.  Pulmonary:     Effort:  Pulmonary effort is normal.     Breath sounds: Normal breath sounds.  Abdominal:     General: Bowel sounds are normal.     Palpations: Abdomen is soft.  Musculoskeletal:     Cervical back: Normal range of motion.  Lymphadenopathy:     Cervical: No cervical adenopathy.  Skin:    General: Skin is warm and dry.      Capillary Refill: Capillary refill takes less than 2 seconds.  Neurological:     Mental Status: She is alert and oriented to person, place, and time.  Psychiatric:        Behavior: Behavior normal.      Musculoskeletal Exam: ***  CDAI Exam: CDAI Score: -- Patient Global: --; Provider Global: -- Swollen: --; Tender: -- Joint Exam 10/07/2022   No joint exam has been documented for this visit   There is currently no information documented on the homunculus. Go to the Rheumatology activity and complete the homunculus joint exam.  Investigation: No additional findings.  Imaging: No results found.  Recent Labs: Lab Results  Component Value Date   WBC 5.0 05/02/2020   HGB 13.0 05/02/2020   PLT 199 05/02/2020   NA 138 05/02/2020   K 4.5 05/02/2020   CL 106 05/02/2020   CO2 26 05/02/2020   GLUCOSE 93 05/02/2020   BUN 9 05/02/2020   CREATININE 0.90 05/02/2020   BILITOT 0.5 08/17/2017   ALKPHOS 62 08/17/2017   AST 33 08/17/2017   ALT 33 08/17/2017   PROT 7.7 08/17/2017   ALBUMIN 4.5 08/17/2017   CALCIUM 9.8 05/02/2020   GFRAA >60 08/17/2017    Speciality Comments: No specialty comments available.  Procedures:  No procedures performed Allergies: Azithromycin and Codeine   Assessment / Plan:     Visit Diagnoses: Myofascial pain syndrome  Trapezius muscle spasm  S/P carpal tunnel release  Primary osteoarthritis of both hands  Status post total bilateral knee replacement using cement  DDD (degenerative disc disease), cervical  DDD (degenerative disc disease), lumbar  History of vitamin D deficiency  Age-related osteoporosis without current pathological fracture  Essential hypertension  Other specified hearing loss of both ears  Orders: No orders of the defined types were placed in this encounter.  No orders of the defined types were placed in this encounter.   Face-to-face time spent with patient was *** minutes. Greater than 50% of time was spent  in counseling and coordination of care.  Follow-Up Instructions: No follow-ups on file.   Gearldine Bienenstock, PA-C  Note - This record has been created using Dragon software.  Chart creation errors have been sought, but may not always  have been located. Such creation errors do not reflect on  the standard of medical care.

## 2022-10-07 ENCOUNTER — Encounter: Payer: Self-pay | Admitting: Physician Assistant

## 2022-10-07 ENCOUNTER — Ambulatory Visit: Payer: Medicare Other | Attending: Physician Assistant | Admitting: Physician Assistant

## 2022-10-07 VITALS — BP 137/76 | HR 79 | Resp 17 | Ht 65.5 in | Wt 193.4 lb

## 2022-10-07 DIAGNOSIS — H918X3 Other specified hearing loss, bilateral: Secondary | ICD-10-CM | POA: Diagnosis not present

## 2022-10-07 DIAGNOSIS — M810 Age-related osteoporosis without current pathological fracture: Secondary | ICD-10-CM | POA: Diagnosis not present

## 2022-10-07 DIAGNOSIS — M5136 Other intervertebral disc degeneration, lumbar region: Secondary | ICD-10-CM | POA: Diagnosis not present

## 2022-10-07 DIAGNOSIS — Z8639 Personal history of other endocrine, nutritional and metabolic disease: Secondary | ICD-10-CM | POA: Diagnosis not present

## 2022-10-07 DIAGNOSIS — M19042 Primary osteoarthritis, left hand: Secondary | ICD-10-CM | POA: Diagnosis not present

## 2022-10-07 DIAGNOSIS — Z96653 Presence of artificial knee joint, bilateral: Secondary | ICD-10-CM | POA: Diagnosis not present

## 2022-10-07 DIAGNOSIS — M62838 Other muscle spasm: Secondary | ICD-10-CM

## 2022-10-07 DIAGNOSIS — M7918 Myalgia, other site: Secondary | ICD-10-CM | POA: Diagnosis not present

## 2022-10-07 DIAGNOSIS — M19041 Primary osteoarthritis, right hand: Secondary | ICD-10-CM | POA: Diagnosis not present

## 2022-10-07 DIAGNOSIS — Z9889 Other specified postprocedural states: Secondary | ICD-10-CM | POA: Diagnosis not present

## 2022-10-07 DIAGNOSIS — M5030 Other cervical disc degeneration, unspecified cervical region: Secondary | ICD-10-CM | POA: Diagnosis not present

## 2022-10-07 DIAGNOSIS — M7912 Myalgia of auxiliary muscles, head and neck: Secondary | ICD-10-CM

## 2022-10-07 DIAGNOSIS — I10 Essential (primary) hypertension: Secondary | ICD-10-CM | POA: Diagnosis not present

## 2022-10-07 MED ORDER — LIDOCAINE HCL 1 % IJ SOLN
0.5000 mL | INTRAMUSCULAR | Status: AC | PRN
Start: 2022-10-07 — End: 2022-10-07
  Administered 2022-10-07: .5 mL

## 2022-10-07 MED ORDER — TRIAMCINOLONE ACETONIDE 40 MG/ML IJ SUSP
10.0000 mg | INTRAMUSCULAR | Status: AC | PRN
Start: 2022-10-07 — End: 2022-10-07
  Administered 2022-10-07: 10 mg via INTRAMUSCULAR

## 2022-10-08 DIAGNOSIS — E118 Type 2 diabetes mellitus with unspecified complications: Secondary | ICD-10-CM | POA: Diagnosis not present

## 2022-10-08 DIAGNOSIS — G8929 Other chronic pain: Secondary | ICD-10-CM | POA: Diagnosis not present

## 2022-10-08 DIAGNOSIS — G479 Sleep disorder, unspecified: Secondary | ICD-10-CM | POA: Diagnosis not present

## 2022-10-08 DIAGNOSIS — R413 Other amnesia: Secondary | ICD-10-CM | POA: Diagnosis not present

## 2022-10-13 ENCOUNTER — Encounter: Payer: Self-pay | Admitting: Physician Assistant

## 2022-11-14 ENCOUNTER — Other Ambulatory Visit: Payer: Self-pay | Admitting: Physician Assistant

## 2022-11-16 ENCOUNTER — Encounter: Payer: Self-pay | Admitting: Physician Assistant

## 2022-11-16 NOTE — Telephone Encounter (Signed)
Last Fill: 06/19/2022  Next Visit: 01/11/2023  Last Visit: 10/07/2022  Dx: Myofascial pain syndrome   Current Dose per office note on 9?25/2024: methocarbamol 500 mg 1 tablet daily as needed for muscle spasms   Okay to refill Methocarbamol?

## 2022-12-28 NOTE — Progress Notes (Deleted)
Office Visit Note  Patient: Meghan Alvarez             Date of Birth: 07/29/1934           MRN: [id number]             PCP: Jarrett Soho, PA-C Referring: No ref. provider found Visit Date: 01/11/2023 Occupation: @GUAROCC @  Subjective:  No chief complaint on file.   History of Present Illness: Meghan Alvarez is a 88 y.o. female ***     Activities of Daily Living:  Patient reports morning stiffness for *** {minute/hour:19697}.   Patient {ACTIONS;DENIES/REPORTS:21021675::"Denies"} nocturnal pain.  Difficulty dressing/grooming: {ACTIONS;DENIES/REPORTS:21021675::"Denies"} Difficulty climbing stairs: {ACTIONS;DENIES/REPORTS:21021675::"Denies"} Difficulty getting out of chair: {ACTIONS;DENIES/REPORTS:21021675::"Denies"} Difficulty using hands for taps, buttons, cutlery, and/or writing: {ACTIONS;DENIES/REPORTS:21021675::"Denies"}  No Rheumatology ROS completed.   PMFS History:  Patient Active Problem List   Diagnosis Date Noted   Stage 3a chronic kidney disease (HCC) 07/11/2020   Chest pain 01/19/2020   Diabetes mellitus with coincident hypertension (HCC) 01/19/2020   Other hyperlipidemia 01/19/2020   Myofascial pain syndrome 02/04/2016   Spondylosis of lumbar region without myelopathy or radiculopathy 02/04/2016   DJD (degenerative joint disease), cervical 02/04/2016   Age-related osteoporosis without current pathological fracture 02/04/2016   Vitamin D deficiency 02/04/2016   S/P TKR (total knee replacement) using cement 03/30/2014    Past Medical History:  Diagnosis Date   Arthritis    Complication of anesthesia    Diabetes mellitus without complication (HCC)    Fibromyalgia    Hyperlipemia    PONV (postoperative nausea and vomiting)     Family History  Problem Relation Age of Onset   Cancer Sister        breast   Diabetes Brother    Past Surgical History:  Procedure Laterality Date   APPENDECTOMY  01/12/2002   ruptured    CARPAL TUNNEL RELEASE Right  05/03/2020   Procedure: RIGHT OPEN CARPAL TUNNEL RELEASE, RIGHT THUMB METACARPOPHALANGEAL JOINT INJECTION;  Surgeon: Kerrin Champagne, MD;  Location: [facility];  Service: Orthopedics;  Laterality: Right;   CATARACT EXTRACTION, BILATERAL  2022   CERVICAL DISCECTOMY  01/12/1989   DILATION AND CURETTAGE OF UTERUS  01/13/2003   EYE PROCEDURE Right 02/12/2022   FRACTURE SURGERY Left 01/13/2004   arm   JOINT REPLACEMENT Left 01/12/2006   knee repl   SHOULDER ARTHROSCOPY Right 01/13/1999   SHOULDER ARTHROSCOPY Left 01/12/2005   TONSILLECTOMY     TOTAL KNEE ARTHROPLASTY Right 03/30/2014   DR NORRIS   TOTAL KNEE ARTHROPLASTY Right 03/30/2014   Procedure: RIGHT TOTAL KNEE ARTHROPLASTY;  Surgeon: Beverely Low, MD;  Location: [facility];  Service: Orthopedics;  Laterality: Right;   Social History   Social History Narrative   Not on file   Immunization History  Administered Date(s) Administered   Influenza, High Dose Seasonal PF 10/16/2018   Moderna Sars-Covid-2 Vaccination 01/23/2019, 02/20/2019, 11/07/2019   Zoster Recombinant(Shingrix) 11/24/2018     Objective: Vital Signs: There were no vitals taken for this visit.   Physical Exam   Musculoskeletal Exam: ***  CDAI Exam: CDAI Score: -- Patient Global: --; Provider Global: -- Swollen: --; Tender: -- Joint Exam 01/11/2023   No joint exam has been documented for this visit   There is currently no information documented on the homunculus. Go to the Rheumatology activity and complete the homunculus joint exam.  Investigation: No additional findings.  Imaging: No results found.  Recent Labs: Lab Results  Component Value Date  WBC 5.0 05/02/2020   HGB 13.0 05/02/2020   PLT 199 05/02/2020   NA 138 05/02/2020   K 4.5 05/02/2020   CL 106 05/02/2020   CO2 26 05/02/2020   GLUCOSE 93 05/02/2020   BUN 9 05/02/2020   CREATININE 0.90 05/02/2020   BILITOT 0.5 08/17/2017   ALKPHOS 62 08/17/2017   AST 33 08/17/2017    ALT 33 08/17/2017   PROT 7.7 08/17/2017   ALBUMIN 4.5 08/17/2017   CALCIUM 9.8 05/02/2020   GFRAA >60 08/17/2017    Speciality Comments: No specialty comments available.  Procedures:  No procedures performed Allergies: Azithromycin and Codeine   Assessment / Plan:     Visit Diagnoses: Myofascial pain syndrome  Trapezius muscle spasm  S/P carpal tunnel release  Primary osteoarthritis of both hands  Status post total bilateral knee replacement using cement  DDD (degenerative disc disease), cervical  Degeneration of intervertebral disc of lumbar region without discogenic back pain or lower extremity pain  History of vitamin D deficiency  Age-related osteoporosis without current pathological fracture  Essential hypertension  Other specified hearing loss of both ears  Orders: No orders of the defined types were placed in this encounter.  No orders of the defined types were placed in this encounter.   Face-to-face time spent with patient was *** minutes. Greater than 50% of time was spent in counseling and coordination of care.  Follow-Up Instructions: No follow-ups on file.   Gearldine Bienenstock, PA-C  Note - This record has been created using Dragon software.  Chart creation errors have been sought, but may not always  have been located. Such creation errors do not reflect on  the standard of medical care.

## 2023-01-11 ENCOUNTER — Ambulatory Visit: Payer: Medicare Other | Admitting: Physician Assistant

## 2023-01-11 DIAGNOSIS — Z96653 Presence of artificial knee joint, bilateral: Secondary | ICD-10-CM

## 2023-01-11 DIAGNOSIS — M810 Age-related osteoporosis without current pathological fracture: Secondary | ICD-10-CM

## 2023-01-11 DIAGNOSIS — M5030 Other cervical disc degeneration, unspecified cervical region: Secondary | ICD-10-CM

## 2023-01-11 DIAGNOSIS — I10 Essential (primary) hypertension: Secondary | ICD-10-CM

## 2023-01-11 DIAGNOSIS — Z9889 Other specified postprocedural states: Secondary | ICD-10-CM

## 2023-01-11 DIAGNOSIS — Z8639 Personal history of other endocrine, nutritional and metabolic disease: Secondary | ICD-10-CM

## 2023-01-11 DIAGNOSIS — M51369 Other intervertebral disc degeneration, lumbar region without mention of lumbar back pain or lower extremity pain: Secondary | ICD-10-CM

## 2023-01-11 DIAGNOSIS — M7918 Myalgia, other site: Secondary | ICD-10-CM

## 2023-01-11 DIAGNOSIS — M19041 Primary osteoarthritis, right hand: Secondary | ICD-10-CM

## 2023-01-11 DIAGNOSIS — M62838 Other muscle spasm: Secondary | ICD-10-CM

## 2023-01-11 DIAGNOSIS — H918X3 Other specified hearing loss, bilateral: Secondary | ICD-10-CM
